# Patient Record
Sex: Female | Born: 1970
Health system: Southern US, Community
[De-identification: ages and names within clinical notes are randomized; demographics above are authoritative.]

## PROBLEM LIST (undated history)

## (undated) DIAGNOSIS — Z789 Other specified health status: Secondary | ICD-10-CM

## (undated) DIAGNOSIS — E119 Type 2 diabetes mellitus without complications: Secondary | ICD-10-CM

## (undated) DIAGNOSIS — E039 Hypothyroidism, unspecified: Secondary | ICD-10-CM

## (undated) DIAGNOSIS — E782 Mixed hyperlipidemia: Secondary | ICD-10-CM

## (undated) DIAGNOSIS — M1990 Unspecified osteoarthritis, unspecified site: Secondary | ICD-10-CM

## (undated) DIAGNOSIS — I10 Essential (primary) hypertension: Secondary | ICD-10-CM

## (undated) DIAGNOSIS — K219 Gastro-esophageal reflux disease without esophagitis: Secondary | ICD-10-CM

## (undated) DIAGNOSIS — Z9889 Other specified postprocedural states: Secondary | ICD-10-CM

## (undated) DIAGNOSIS — R519 Headache, unspecified: Secondary | ICD-10-CM

## (undated) DIAGNOSIS — R112 Nausea with vomiting, unspecified: Secondary | ICD-10-CM

## (undated) HISTORY — PX: PATELLA ARTHROPLASTY: SHX1021

## (undated) HISTORY — DX: Gastro-esophageal reflux disease without esophagitis: K21.9

## (undated) HISTORY — DX: Mixed hyperlipidemia: E78.2

## (undated) HISTORY — DX: Essential (primary) hypertension: I10

## (undated) HISTORY — PX: ABDOMINAL HYSTERECTOMY: SHX81

## (undated) HISTORY — PX: TUBAL LIGATION: SHX77

---

## 2006-11-21 ENCOUNTER — Ambulatory Visit (HOSPITAL_COMMUNITY): Admission: RE | Admit: 2006-11-21 | Discharge: 2006-11-21 | Payer: Self-pay | Admitting: Family Medicine

## 2007-02-12 ENCOUNTER — Ambulatory Visit (HOSPITAL_COMMUNITY): Admission: RE | Admit: 2007-02-12 | Discharge: 2007-02-12 | Payer: Self-pay | Admitting: Family Medicine

## 2007-02-15 HISTORY — PX: KNEE ARTHROSCOPY: SHX127

## 2007-08-13 ENCOUNTER — Ambulatory Visit (HOSPITAL_COMMUNITY): Admission: RE | Admit: 2007-08-13 | Discharge: 2007-08-13 | Payer: Self-pay | Admitting: Family Medicine

## 2007-12-21 ENCOUNTER — Ambulatory Visit (HOSPITAL_COMMUNITY): Admission: RE | Admit: 2007-12-21 | Discharge: 2007-12-21 | Payer: Self-pay | Admitting: Family Medicine

## 2008-01-03 ENCOUNTER — Ambulatory Visit: Payer: Self-pay | Admitting: Orthopedic Surgery

## 2008-01-07 ENCOUNTER — Ambulatory Visit: Payer: Self-pay | Admitting: Orthopedic Surgery

## 2008-11-17 ENCOUNTER — Ambulatory Visit: Payer: Self-pay | Admitting: Orthopedic Surgery

## 2009-01-26 ENCOUNTER — Ambulatory Visit: Payer: Self-pay | Admitting: Orthopedic Surgery

## 2009-02-09 ENCOUNTER — Inpatient Hospital Stay: Payer: Self-pay | Admitting: Orthopedic Surgery

## 2009-02-14 HISTORY — PX: TOTAL KNEE ARTHROPLASTY: SHX125

## 2009-03-12 ENCOUNTER — Ambulatory Visit: Payer: Self-pay | Admitting: Orthopedic Surgery

## 2009-04-16 ENCOUNTER — Ambulatory Visit: Payer: Self-pay | Admitting: Orthopedic Surgery

## 2009-11-25 ENCOUNTER — Ambulatory Visit (HOSPITAL_COMMUNITY): Admission: RE | Admit: 2009-11-25 | Discharge: 2009-11-25 | Payer: Self-pay | Admitting: Family Medicine

## 2010-03-07 ENCOUNTER — Encounter: Payer: Self-pay | Admitting: Family Medicine

## 2010-05-27 NOTE — Assessment & Plan Note (Deleted)
NAME:  Carrie [name], Carrie [name] NO.:  [id number]  MEDICAL RECORD NO.:  [id number]           PATIENT TYPE:  LOCATION:  [facility] at [facility]           FACILITY:  PHYSICIAN:  Tinnie Gens, MD        DATE OF BIRTH:  1970-02-20  DATE OF SERVICE:  05/05/2010                                 CLINIC NOTE  CHIEF COMPLAINT:  New patient followed by ovarian cyst.  HISTORY OF PRESENT ILLNESS:  The patient is a 40 year old gravida 3, para 2-0-1-3 who has previously been a patient at [facility].  She had undergone tubal ligation and endometrial ablation and that often she continues to have regular cycles they are just not heavy as they were before.  Additionally, she was having some issues with some abdominal pain and what not she works at [employer] in [employer] office and the NP there ordered a pelvic and renal ultrasound.  Her renal ultrasound apparently was negative and this was ordered because she had blood in her urine.  She had ovarian cyst noted as well and she is here today to follow that up as well.  Since 4 years since her last Pap smear, she does have a history of abnormal Pap.  PAST MEDICAL HISTORY:  Significant for vitamin D deficiency and obesity.  PAST SURGICAL HISTORY:  She has had a D and C for an elective termination of pregnancy and bilateral tubal ligation and endometrial ablation.  MEDICATIONS:  She is on none.  ALLERGIES:  None known.  OBSTETRICAL HISTORY:  G3, P2 with one termination and two vaginal deliveries.  GYNECOLOGIC HISTORY:  Menarche at age 62.  Cycles are every 24 and 28 and 30 days, last for approximately 6 days with medium flow and mild pain.  She has history of abnormal Pap in 1989, followed up by repeat Pap.  She has not had colpo, cyro or LEEP that she knows of.  FAMILY HISTORY:  Diabetes in her grandmother.  Hypertension in her dad and brother.  Her father is deceased from liver failure secondary to hepatitis  B.  SOCIAL HISTORY:  The patient lives with her husband and 2 children.  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  She does not smoke. She drinks approximately one alcoholic beverage per month and denies other drug use.  REVIEW OF SYSTEMS:  On 14-point review of systems, the patient complains of akinesia which she thinks is related to her weight, where she continuously gains weight.  She complains of fatigue and possible ringing in her ears.  She also had a TSH and other things done at the [facility] at her work within the last year.  PHYSICAL EXAMINATION:  VITAL SIGNS:  On exam today, her vitals are as noted in the chart. GENERAL:  She is an obese female, in no acute distress. HEENT:  Normocephalic, atraumatic.  Sclerae anicteric. NECK:  Supple.  Normal thyroid. LUNGS:  Clear bilaterally. CV:  Regular rate and rhythm without rubs, gallops, or murmurs. ABDOMEN:  Soft, nontender and nondistended. EXTREMITIES:  No cyanosis, clubbing or edema.  Distal pulses 2+. GU:  Normal external female genitalia.  BUS is normal.  Vagina is pink and rugated.  Cervix is parous without lesion.  Uterus is small, anteverted.  No adnexal masses or tenderness.  IMPRESSION: 1. New patient yearly exam. 2. Obesity. 3. Vitamin D. 4. History of ovarian cyst by ultrasound.  PLAN: 1. Pap smear today. 2. We will obtain records of [facility] imaging reports and see what     if any follow up is needed for these ovarian cyst. 3. A lengthy discussion was held with the patient about exercise and     calories and how one would need to modify these things in order to     have weight loss.  We will call the patient when we have results of     her ultrasound studies to see what if any followup is needed.          ______________________________ Tinnie Gens, MD    TP/MEDQ  D:  05/05/2010  T:  05/06/2010  Job:  [id number]

## 2010-11-08 ENCOUNTER — Other Ambulatory Visit (HOSPITAL_COMMUNITY): Payer: Self-pay | Admitting: Family Medicine

## 2010-11-08 DIAGNOSIS — Z139 Encounter for screening, unspecified: Secondary | ICD-10-CM

## 2010-11-11 ENCOUNTER — Other Ambulatory Visit (HOSPITAL_COMMUNITY): Payer: Self-pay | Admitting: Specialist

## 2010-11-11 DIAGNOSIS — M25561 Pain in right knee: Secondary | ICD-10-CM

## 2010-11-19 ENCOUNTER — Encounter (HOSPITAL_COMMUNITY)
Admission: RE | Admit: 2010-11-19 | Discharge: 2010-11-19 | Disposition: A | Discharge status: Home / Self Care | Payer: BC Managed Care – PPO | Source: Ambulatory Visit | Attending: Specialist | Admitting: Specialist

## 2010-11-19 ENCOUNTER — Ambulatory Visit (HOSPITAL_COMMUNITY)
Admission: RE | Admit: 2010-11-19 | Discharge: 2010-11-19 | Disposition: A | Discharge status: Home / Self Care | Payer: BC Managed Care – PPO | Source: Ambulatory Visit | Attending: Specialist | Admitting: Specialist

## 2010-11-19 ENCOUNTER — Other Ambulatory Visit (HOSPITAL_COMMUNITY): Payer: Self-pay | Admitting: Specialist

## 2010-11-19 DIAGNOSIS — Z96659 Presence of unspecified artificial knee joint: Secondary | ICD-10-CM | POA: Insufficient documentation

## 2010-11-19 DIAGNOSIS — M25561 Pain in right knee: Secondary | ICD-10-CM

## 2010-11-19 DIAGNOSIS — M7989 Other specified soft tissue disorders: Secondary | ICD-10-CM | POA: Insufficient documentation

## 2010-11-19 DIAGNOSIS — M25569 Pain in unspecified knee: Secondary | ICD-10-CM | POA: Insufficient documentation

## 2010-11-19 MED ORDER — TECHNETIUM TC 99M MEDRONATE IV KIT
25.0000 | PACK | Freq: Once | INTRAVENOUS | Status: AC | PRN
  Administered 2010-11-19: 25 via INTRAVENOUS

## 2010-11-29 ENCOUNTER — Ambulatory Visit (HOSPITAL_COMMUNITY)
Admission: RE | Admit: 2010-11-29 | Discharge: 2010-11-29 | Disposition: A | Discharge status: Home / Self Care | Payer: BC Managed Care – PPO | Source: Ambulatory Visit | Attending: Family Medicine | Admitting: Family Medicine

## 2010-11-29 DIAGNOSIS — Z139 Encounter for screening, unspecified: Secondary | ICD-10-CM

## 2010-11-29 DIAGNOSIS — Z1231 Encounter for screening mammogram for malignant neoplasm of breast: Secondary | ICD-10-CM | POA: Insufficient documentation

## 2011-02-04 ENCOUNTER — Encounter (HOSPITAL_BASED_OUTPATIENT_CLINIC_OR_DEPARTMENT_OTHER): Payer: Self-pay | Admitting: *Deleted

## 2011-02-04 NOTE — Progress Notes (Signed)
Has had several knee surg [place] [facility]-no problems

## 2011-02-11 ENCOUNTER — Other Ambulatory Visit: Payer: Self-pay | Admitting: Specialist

## 2011-02-11 ENCOUNTER — Encounter (HOSPITAL_BASED_OUTPATIENT_CLINIC_OR_DEPARTMENT_OTHER): Payer: Self-pay | Admitting: Anesthesiology

## 2011-02-11 ENCOUNTER — Encounter (HOSPITAL_BASED_OUTPATIENT_CLINIC_OR_DEPARTMENT_OTHER)
Admission: RE | Disposition: A | Discharge status: Home / Self Care | Payer: Self-pay | Source: Ambulatory Visit | Attending: Specialist

## 2011-02-11 ENCOUNTER — Encounter (HOSPITAL_BASED_OUTPATIENT_CLINIC_OR_DEPARTMENT_OTHER): Payer: Self-pay | Admitting: *Deleted

## 2011-02-11 ENCOUNTER — Ambulatory Visit (HOSPITAL_BASED_OUTPATIENT_CLINIC_OR_DEPARTMENT_OTHER): Payer: BC Managed Care – PPO | Admitting: Anesthesiology

## 2011-02-11 ENCOUNTER — Ambulatory Visit (HOSPITAL_BASED_OUTPATIENT_CLINIC_OR_DEPARTMENT_OTHER)
Admission: RE | Admit: 2011-02-11 | Discharge: 2011-02-11 | Disposition: A | Discharge status: Home / Self Care | Payer: BC Managed Care – PPO | Source: Ambulatory Visit | Attending: Specialist | Admitting: Specialist

## 2011-02-11 ENCOUNTER — Encounter (HOSPITAL_BASED_OUTPATIENT_CLINIC_OR_DEPARTMENT_OTHER): Payer: Self-pay | Admitting: Specialist

## 2011-02-11 DIAGNOSIS — D3613 Benign neoplasm of peripheral nerves and autonomic nervous system of lower limb, including hip: Secondary | ICD-10-CM

## 2011-02-11 DIAGNOSIS — D2120 Benign neoplasm of connective and other soft tissue of unspecified lower limb, including hip: Secondary | ICD-10-CM | POA: Insufficient documentation

## 2011-02-11 HISTORY — DX: Other specified health status: Z78.9

## 2011-02-11 HISTORY — PX: MASS EXCISION: SHX2000

## 2011-02-11 HISTORY — DX: Unspecified osteoarthritis, unspecified site: M19.90

## 2011-02-11 SURGERY — EXCISION MASS
Anesthesia: General | Site: Knee | Laterality: Right | Wound class: Clean

## 2011-02-11 MED ORDER — CEFAZOLIN SODIUM 1-5 GM-% IV SOLN
1.0000 g | INTRAVENOUS | Status: AC
  Administered 2011-02-11: 1 g via INTRAVENOUS

## 2011-02-11 MED ORDER — FENTANYL CITRATE 0.05 MG/ML IJ SOLN
25.0000 ug | INTRAMUSCULAR | Status: DC | PRN
  Administered 2011-02-11: 25 ug via INTRAVENOUS
  Administered 2011-02-11: 50 ug via INTRAVENOUS

## 2011-02-11 MED ORDER — FENTANYL CITRATE 0.05 MG/ML IJ SOLN: 50.0000 ug | INTRAMUSCULAR | Status: DC | PRN

## 2011-02-11 MED ORDER — DEXAMETHASONE SODIUM PHOSPHATE 4 MG/ML IJ SOLN
INTRAMUSCULAR | Status: DC | PRN
  Administered 2011-02-11: 10 mg via INTRAVENOUS

## 2011-02-11 MED ORDER — PROPOFOL 10 MG/ML IV EMUL
INTRAVENOUS | Status: DC | PRN
  Administered 2011-02-11: 200 mg via INTRAVENOUS

## 2011-02-11 MED ORDER — ACETAMINOPHEN 10 MG/ML IV SOLN
1000.0000 mg | Freq: Once | INTRAVENOUS | Status: AC
  Administered 2011-02-11: 1000 mg via INTRAVENOUS

## 2011-02-11 MED ORDER — ONDANSETRON HCL 4 MG/2ML IJ SOLN
INTRAMUSCULAR | Status: DC | PRN
  Administered 2011-02-11: 4 mg via INTRAVENOUS

## 2011-02-11 MED ORDER — CHLORHEXIDINE GLUCONATE 4 % EX LIQD: 60.0000 mL | Freq: Once | CUTANEOUS | Status: DC

## 2011-02-11 MED ORDER — LACTATED RINGERS IV SOLN
INTRAVENOUS | Status: DC
  Administered 2011-02-11 (×2): via INTRAVENOUS

## 2011-02-11 MED ORDER — LIDOCAINE HCL (CARDIAC) 20 MG/ML IV SOLN
INTRAVENOUS | Status: DC | PRN
  Administered 2011-02-11: 100 mg via INTRAVENOUS

## 2011-02-11 MED ORDER — CEPHALEXIN 500 MG PO CAPS: 500.0000 mg | ORAL_CAPSULE | Freq: Three times a day (TID) | ORAL | Status: AC

## 2011-02-11 MED ORDER — MIDAZOLAM HCL 2 MG/2ML IJ SOLN: 0.5000 mg | INTRAMUSCULAR | Status: DC | PRN

## 2011-02-11 MED ORDER — HYDROCODONE-ACETAMINOPHEN 5-500 MG PO CAPS: ORAL_CAPSULE | ORAL | Status: DC

## 2011-02-11 MED ORDER — FENTANYL CITRATE 0.05 MG/ML IJ SOLN
INTRAMUSCULAR | Status: DC | PRN
  Administered 2011-02-11: 25 ug via INTRAVENOUS
  Administered 2011-02-11: 50 ug via INTRAVENOUS
  Administered 2011-02-11: 25 ug via INTRAVENOUS
  Administered 2011-02-11 (×2): 50 ug via INTRAVENOUS

## 2011-02-11 MED ORDER — BUPIVACAINE HCL (PF) 0.5 % IJ SOLN
INTRAMUSCULAR | Status: DC | PRN
  Administered 2011-02-11: 20 mL

## 2011-02-11 MED ORDER — METOCLOPRAMIDE HCL 5 MG/ML IJ SOLN: 10.0000 mg | Freq: Once | INTRAMUSCULAR | Status: DC | PRN

## 2011-02-11 MED ORDER — MIDAZOLAM HCL 5 MG/5ML IJ SOLN
INTRAMUSCULAR | Status: DC | PRN
  Administered 2011-02-11: 2 mg via INTRAVENOUS

## 2011-02-11 MED ORDER — MORPHINE SULFATE 2 MG/ML IJ SOLN: 0.0500 mg/kg | INTRAMUSCULAR | Status: DC | PRN

## 2011-02-11 SURGICAL SUPPLY — 54 items
ADH SKN CLS APL DERMABOND .7 (GAUZE/BANDAGES/DRESSINGS) ×1
APL SKNCLS STERI-STRIP NONHPOA (GAUZE/BANDAGES/DRESSINGS)
BANDAGE ELASTIC 3 VELCRO ST LF (GAUZE/BANDAGES/DRESSINGS) IMPLANT
BANDAGE ELASTIC 4 VELCRO ST LF (GAUZE/BANDAGES/DRESSINGS) IMPLANT
BANDAGE ELASTIC 6 VELCRO ST LF (GAUZE/BANDAGES/DRESSINGS) ×1 IMPLANT
BANDAGE ESMARK 6X9 LF (GAUZE/BANDAGES/DRESSINGS) IMPLANT
BANDAGE GAUZE ELAST BULKY 4 IN (GAUZE/BANDAGES/DRESSINGS) ×1 IMPLANT
BENZOIN TINCTURE PRP APPL 2/3 (GAUZE/BANDAGES/DRESSINGS) IMPLANT
BLADE SURG 15 STRL LF DISP TIS (BLADE) ×1 IMPLANT
BLADE SURG 15 STRL SS (BLADE) ×2
BNDG CMPR 9X6 STRL LF SNTH (GAUZE/BANDAGES/DRESSINGS) ×1
BNDG ESMARK 6X9 LF (GAUZE/BANDAGES/DRESSINGS) ×2
CLOTH BEACON ORANGE TIMEOUT ST (SAFETY) ×2 IMPLANT
CORDS BIPOLAR (ELECTRODE) IMPLANT
COVER MAYO STAND STRL (DRAPES) ×1 IMPLANT
COVER TABLE BACK 60X90 (DRAPES) ×2 IMPLANT
DERMABOND ADVANCED (GAUZE/BANDAGES/DRESSINGS) ×1
DERMABOND ADVANCED .7 DNX12 (GAUZE/BANDAGES/DRESSINGS) IMPLANT
DRAPE EXTREMITY T 121X128X90 (DRAPE) ×2 IMPLANT
DRAPE U 20/CS (DRAPES) ×1 IMPLANT
DRAPE U-SHAPE 47X51 STRL (DRAPES) ×1 IMPLANT
DRSG PAD ABDOMINAL 8X10 ST (GAUZE/BANDAGES/DRESSINGS) ×1 IMPLANT
DURAPREP 26ML APPLICATOR (WOUND CARE) ×2 IMPLANT
ELECT REM PT RETURN 9FT ADLT (ELECTROSURGICAL) ×2
ELECTRODE REM PT RTRN 9FT ADLT (ELECTROSURGICAL) IMPLANT
GAUZE XEROFORM 1X8 LF (GAUZE/BANDAGES/DRESSINGS) ×1 IMPLANT
GLOVE BIOGEL PI IND STRL 8.5 (GLOVE) IMPLANT
GLOVE BIOGEL PI INDICATOR 8.5 (GLOVE) ×1
GLOVE SKINSENSE NS SZ7.0 (GLOVE) ×1
GLOVE SKINSENSE STRL SZ7.0 (GLOVE) IMPLANT
GLOVE SURG ORTHO 8.5 STRL (GLOVE) ×1 IMPLANT
GOWN PREVENTION PLUS XLARGE (GOWN DISPOSABLE) ×2 IMPLANT
GOWN PREVENTION PLUS XXLARGE (GOWN DISPOSABLE) ×1 IMPLANT
NEEDLE HYPO 22GX1.5 SAFETY (NEEDLE) ×1 IMPLANT
PACK BASIN DAY SURGERY FS (CUSTOM PROCEDURE TRAY) ×2 IMPLANT
PAD CAST 4YDX4 CTTN HI CHSV (CAST SUPPLIES) IMPLANT
PADDING CAST COTTON 4X4 STRL (CAST SUPPLIES)
PENCIL BUTTON HOLSTER BLD 10FT (ELECTRODE) ×1 IMPLANT
SPONGE GAUZE 4X4 12PLY (GAUZE/BANDAGES/DRESSINGS) ×1 IMPLANT
SPONGE GAUZE 4X4 16PLY UNSTER (WOUND CARE) ×1 IMPLANT
STAPLER VISISTAT (STAPLE) IMPLANT
STOCKINETTE 4X48 STRL (DRAPES) IMPLANT
STOCKINETTE 6  STRL (DRAPES)
STOCKINETTE 6 STRL (DRAPES) IMPLANT
STOCKINETTE IMPERVIOUS LG (DRAPES) ×1 IMPLANT
STRIP CLOSURE SKIN 1/2X4 (GAUZE/BANDAGES/DRESSINGS) IMPLANT
SUT VIC AB 0 SH 27 (SUTURE) IMPLANT
SUT VIC AB 2-0 SH 27 (SUTURE) ×2
SUT VIC AB 2-0 SH 27XBRD (SUTURE) IMPLANT
SUT VICRYL 4-0 PS2 18IN ABS (SUTURE) ×1 IMPLANT
SYR CONTROL 10ML LL (SYRINGE) ×1 IMPLANT
TOWEL OR 17X24 6PK STRL BLUE (TOWEL DISPOSABLE) ×2 IMPLANT
UNDERPAD 30X30 INCONTINENT (UNDERPADS AND DIAPERS) ×1 IMPLANT
WATER STERILE IRR 1000ML POUR (IV SOLUTION) ×1 IMPLANT

## 2011-02-11 NOTE — Transfer of Care (Signed)
Immediate Anesthesia Transfer of Care Note  Patient: Carrie [name]  Procedure(s) Performed:  EXCISION MASS - resection of neuroma right infrapatellar medial knee  Patient Location: PACU  Anesthesia Type: General  Level of Consciousness: awake and sedated  Airway & Oxygen Therapy: Patient Spontanous Breathing and Patient connected to face mask oxygen  Post-op Assessment: Report given to PACU RN and Post -op Vital signs reviewed and stable  Post vital signs: Reviewed and stable  Complications: No apparent anesthesia complications

## 2011-02-11 NOTE — Anesthesia Postprocedure Evaluation (Signed)
  Anesthesia Post-op Note  Patient: Carrie [name]  Procedure(s) Performed:  EXCISION MASS - resection of neuroma right infrapatellar medial knee  Patient Location: PACU  Anesthesia Type: General  Level of Consciousness: awake, alert  and oriented  Airway and Oxygen Therapy: Patient Spontanous Breathing  Post-op Pain: none  Post-op Assessment: Post-op Vital signs reviewed, Patient's Cardiovascular Status Stable, Respiratory Function Stable, Patent Airway and No signs of Nausea or vomiting  Post-op Vital Signs: Reviewed and stable  Complications: No apparent anesthesia complications

## 2011-02-11 NOTE — H&P (Signed)
The patient has been re-examined, and the chart reviewed, and there have been no interval changes to the documented history and physical. Right knee marked, "x" and initials.   The risks, benefits, and alternatives have been discussed at length, and the patient is willing to proceed.

## 2011-02-11 NOTE — Op Note (Signed)
02/11/2011  2:10 PM  PATIENT:  Carrie [name]  40 y.o. 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 Loistine Simas, local infiltration with 20 cc of half percent Marcaine     COMPLICATIONS:  None.       PROCEDURE: The patient was met in the holding area, and the appropriate knee identified and marked with the "x" and my initials   The patient was then transported to OR and was placed on the operative table in a supine position. The patient was then placed under  general anesthesia without difficulty. The patient received appropriate preoperative antibiotic prophylaxis Ancef 1 g. Tourniquet was applied to the operative  thigh.  Leg was then prepped using sterile conditions and draped using sterile technique.  Time-out procedure was called and correct .    The right leg was elevated and Esmarch exsanguinated with a thigh   tourniquet elevated ot 250 mmHg. the area of the expected saphenous infrapatellar branch had been marked preoperatively with a surgical marking and an ultrasound. In fact the neuroma had been localized as well as branches off of the saphenous vein and nerve medially. Incision was made approximately 4 cm medial to the previous medial parapatellar incision scar. Incision was vertical approximately 3 cm in length through the skin and subcutaneous layers of the subcutaneous and deeper layers were then spread using the Metzenbaum scissors and she Special educational needs teacher. The magnification with loupe were used. Self-retaining retractor placed in [employer] used for retraction and subcutaneous layers the skin traversing saphenous nerve was identified. The  nerve was traced and dissected medially to the level of the medial parapatellar incision site then resected laterally brought medial to its furthest extent and then divided using Metzenbaum scissors. The external retinaculum of the knee was then incised in line with the incision and explored. There was some suggestion further branches of the saphenous nerve here which were resected using the Stevens scissors down to the level of the patient and the synovium. Synovium was left intact. Dissected specimens were sent for pathology evaluation for nerve tissue. Irrigation was carried out tourniquet was released total tourniquet time was 18 minutes at 250 mm mercury all bleeders were cauterized using electrocautery. Deep subcutaneous layers were then approximated with interrupted 2-0 Vicryl sutures more superficial layers with interrupted 2-0 Vicryl sutures and the skin closed with interrupted 4-0 Vicryl sutures and Dermabond. 4 x 4's ABDs fixed to the skin with Kerlex and Ace wrap was applied from right foot to the right lower thigh down to the skin and subcutaneous layers were infiltrated with Marcaine half percent of bone from prior to the incision and at the end of the case. Total of 20 cc was used. All instrument and sponge counts were correct. Patient was then reactivated extubated and returned to recovery room in satisfactory condition.Marland Kitchen    Specimen: Infrapatellar branch neuroma of the infrapatellar branch of the right saphenous nerve. Soft tissue on the medial aspect right anterior inferior medial knee.     NITKA,JAMES E  02/11/2011, 2:10 PM

## 2011-02-11 NOTE — Anesthesia Procedure Notes (Signed)
Procedure Name: LMA Insertion Date/Time: 02/11/2011 1:09 PM Performed by: Signa Kell Pre-anesthesia Checklist: Patient identified, Emergency Drugs available, Suction available and Patient being monitored Patient Re-evaluated:Patient Re-evaluated prior to inductionOxygen Delivery Method: Circle System Utilized Preoxygenation: Pre-oxygenation with 100% oxygen Intubation Type: IV induction Ventilation: Mask ventilation without difficulty LMA: LMA inserted LMA Size: 4.0 Number of attempts: 1 Airway Equipment and Method: bite block Placement Confirmation: positive ETCO2 Tube secured with: Tape Dental Injury: Teeth and Oropharynx as per pre-operative assessment

## 2011-02-11 NOTE — H&P (Signed)
Carrie [name] is an 40 y.o. female.   Chief Complaint: Right knee pain  HPI: 40 year old female, 2 years post right knee resurfacing arthropasty of the PF joint with persistant severe medial joint pain. Pain reproducible medial inferior anterior knee. Relieved with local block. Finding consistent with neuroma of the infrapatella branch of the saphenous Nerve.  Past Medical History  Diagnosis Date  . Arthritis   . No pertinent past medical history     Past Surgical History  Procedure Date  . Knee arthroscopy 2009  . Total knee arthroplasty 2011    partial-[place] [facility]   . Patella arthroplasty     rt knee-  . Abdominal hysterectomy   . Tubal ligation     History reviewed. No pertinent family history. Social History:  reports that she has never smoked. She does not have any smokeless tobacco history on file. She reports that she drinks alcohol. She reports that she does not use illicit drugs.  Allergies:  Allergies  Allergen Reactions  . Codeine     headaches    Medications Prior to Admission  Medication Dose Route Frequency Provider Last Rate Last Dose  . acetaminophen (OFIRMEV) IV 1,000 mg  1,000 mg Intravenous Once Constance Goltz, MD      . fentaNYL (SUBLIMAZE) injection 50-100 mcg  50-100 mcg Intravenous PRN Constance Goltz, MD      . lactated ringers infusion   Intravenous Continuous Zenon Mayo, MD 20 mL/hr at 02/11/11 1139    . midazolam (VERSED) injection 0.5-2 mg  0.5-2 mg Intravenous PRN Constance Goltz, MD       Medications Prior to Admission  Medication Sig Dispense Refill  . ibuprofen (ADVIL,MOTRIN) 200 MG tablet Take 200 mg by mouth every 6 (six) hours as needed.          Results for orders placed during the hospital encounter of 02/11/11 (from the past 48 hour(s))  POCT HEMOGLOBIN-HEMACUE     Status: Normal   Collection Time   02/11/11 11:42 AM      Component Value Range Comment   Hemoglobin 14.9  12.0 - 15.0 (g/dL)     No results found.  Review of Systems  Constitutional: Negative.  Negative for fever, chills, weight loss, malaise/fatigue and diaphoresis.  HENT: Negative for hearing loss, ear pain, nosebleeds, congestion, sore throat, neck pain, tinnitus and ear discharge.   Eyes: Negative for blurred vision, double vision, photophobia, pain, discharge and redness.  Respiratory: Negative for cough, hemoptysis, sputum production, shortness of breath, wheezing and stridor.   Cardiovascular: Negative for chest pain, palpitations, orthopnea, claudication, leg swelling and PND.  Gastrointestinal: Negative for heartburn, nausea, vomiting, abdominal pain, diarrhea, constipation, blood in stool and melena.  Genitourinary: Negative for dysuria, urgency, frequency, hematuria and flank pain.  Musculoskeletal: Positive for joint pain. Negative for myalgias, back pain and falls.  Skin: Negative for itching and rash.  Neurological: Positive for tingling and sensory change. Negative for dizziness, tremors, speech change, focal weakness, seizures, loss of consciousness, weakness and headaches.  Endo/Heme/Allergies: Negative for environmental allergies and polydipsia. Does not bruise/bleed easily.  Psychiatric/Behavioral: Negative for depression, suicidal ideas, hallucinations, memory loss and substance abuse. The patient is not nervous/anxious and does not have insomnia.     Blood pressure 135/82, pulse 81, temperature 98 F (36.7 C), temperature source Oral, resp. rate 20, height 4\' 11"  (1.499 m), weight 60.328 kg (133 lb), SpO2 100.00%. Physical Exam  Constitutional: She is oriented to person, place,  and time. She appears well-developed and well-nourished.  HENT:  Head: Normocephalic and atraumatic.  Right Ear: External ear normal.  Left Ear: External ear normal.  Nose: Nose normal.  Mouth/Throat: No oropharyngeal exudate.  Eyes: Conjunctivae and EOM are normal. Pupils are equal, round, and reactive to light.  Right eye exhibits no discharge. Left eye exhibits no discharge. No scleral icterus.  Neck: Normal range of motion. Neck supple. No JVD present. No tracheal deviation present. No thyromegaly present.  Cardiovascular: Normal rate, regular rhythm and normal heart sounds.  Exam reveals no gallop and no friction rub.   No murmur heard. Respiratory: Breath sounds normal. No stridor. No respiratory distress. She has no wheezes. She has no rales. She exhibits no tenderness.  GI: Soft. Bowel sounds are normal. She exhibits no distension and no mass. There is no tenderness. There is no rebound and no guarding.  Musculoskeletal: Normal range of motion. She exhibits no edema and no tenderness.  Lymphadenopathy:    She has no cervical adenopathy.  Neurological: She is alert and oriented to person, place, and time. She has normal reflexes. She displays normal reflexes. No cranial nerve deficit. She exhibits normal muscle tone. Coordination normal.  Skin: Skin is warm and dry. No rash noted. No erythema. No pallor.  Psychiatric: She has a normal mood and affect. Her behavior is normal. Judgment and thought content normal.   anesthesia right lateral knee, reproducible tinels positive nerve pain over medial inferior right knee.  Assessment/Plan Right infrapatella branch of saphenous nerve neuroma. Right Patellofemoral resurfacing arthroplasty.  Plan: Resection of right infrapatella branch of saphenous neuroma.   Tirso Laws E 02/11/2011, 12:43 PM

## 2011-02-11 NOTE — Brief Op Note (Signed)
02/11/2011  2:04 PM  PATIENT:  Carrie [name]  40 y.o. 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  SURGEON:  Surgeon(s): Kerrin Champagne, MD  ASSISTANTS: none   ANESTHESIA:   local and general  EBL:  Total I/O In: 1000 [I.V.:1000] Out: -   BLOOD ADMINISTERED:none  DRAINS: none   LOCAL MEDICATIONS USED:  MARCAINE 0.5% 20CC  SPECIMEN:  Source of Specimen:  RIGHT MEDIAL KNEE NEUROMA AND SOFT TISSUE  DISPOSITION OF SPECIMEN:  PATHOLOGY  COUNTS:  YES  TOURNIQUET:   Total Tourniquet Time Documented: Thigh (Right) - 20 minutes  DICTATION: .Dragon Dictation  PLAN OF CARE: Discharge to home after PACU  PATIENT DISPOSITION:  PACU - hemodynamically stable.   Delay start of Pharmacological VTE agent (>24hrs) due to surgical blood loss or risk of bleeding:  n/a

## 2011-02-11 NOTE — Anesthesia Preprocedure Evaluation (Signed)
Anesthesia Evaluation  Patient identified by MRN, date of birth, ID band Patient awake    Reviewed: Allergy & Precautions, H&P , NPO status , Patient's Chart, lab work & pertinent test results, reviewed documented beta blocker date and time   Airway Mallampati: II TM Distance: >3 FB Neck ROM: full    Dental   Pulmonary neg pulmonary ROS,          Cardiovascular neg cardio ROS     Neuro/Psych Negative Neurological ROS  Negative Psych ROS   GI/Hepatic negative GI ROS, Neg liver ROS,   Endo/Other  Negative Endocrine ROS  Renal/GU negative Renal ROS  Genitourinary negative   Musculoskeletal   Abdominal   Peds  Hematology negative hematology ROS (+)   Anesthesia Other Findings See surgeon's H&P   Reproductive/Obstetrics negative OB ROS                           Anesthesia Physical Anesthesia Plan  ASA: I  Anesthesia Plan: General   Post-op Pain Management:    Induction: Intravenous  Airway Management Planned: LMA  Additional Equipment:   Intra-op Plan:   Post-operative Plan: Extubation in OR  Informed Consent: I have reviewed the patients History and Physical, chart, labs and discussed the procedure including the risks, benefits and alternatives for the proposed anesthesia with the patient or authorized representative who has indicated his/her understanding and acceptance.     Plan Discussed with: CRNA and Surgeon  Anesthesia Plan Comments:         Anesthesia Quick Evaluation  

## 2011-02-14 ENCOUNTER — Encounter (HOSPITAL_BASED_OUTPATIENT_CLINIC_OR_DEPARTMENT_OTHER): Payer: Self-pay | Admitting: Specialist

## 2011-06-17 ENCOUNTER — Other Ambulatory Visit: Payer: Self-pay | Admitting: Orthopedic Surgery

## 2011-06-17 DIAGNOSIS — M25561 Pain in right knee: Secondary | ICD-10-CM

## 2011-06-24 ENCOUNTER — Other Ambulatory Visit: Payer: Self-pay | Admitting: Orthopedic Surgery

## 2011-06-28 ENCOUNTER — Ambulatory Visit
Admission: RE | Admit: 2011-06-28 | Discharge: 2011-06-28 | Disposition: A | Discharge status: Home / Self Care | Payer: Self-pay | Source: Ambulatory Visit | Attending: Orthopedic Surgery | Admitting: Orthopedic Surgery

## 2011-06-28 DIAGNOSIS — M25561 Pain in right knee: Secondary | ICD-10-CM

## 2011-06-28 MED ORDER — IOHEXOL 180 MG/ML  SOLN
20.0000 mL | Freq: Once | INTRAMUSCULAR | Status: AC | PRN
  Administered 2011-06-28: 20 mL via INTRA_ARTICULAR

## 2011-10-14 ENCOUNTER — Other Ambulatory Visit (HOSPITAL_COMMUNITY): Payer: Self-pay | Admitting: Family Medicine

## 2011-10-14 DIAGNOSIS — N62 Hypertrophy of breast: Secondary | ICD-10-CM

## 2011-10-19 ENCOUNTER — Ambulatory Visit (HOSPITAL_COMMUNITY)
Admission: RE | Admit: 2011-10-19 | Discharge: 2011-10-19 | Disposition: A | Discharge status: Home / Self Care | Payer: BC Managed Care – PPO | Source: Ambulatory Visit | Attending: Family Medicine | Admitting: Family Medicine

## 2011-10-19 ENCOUNTER — Encounter (HOSPITAL_COMMUNITY): Payer: Self-pay

## 2011-10-19 DIAGNOSIS — N62 Hypertrophy of breast: Secondary | ICD-10-CM

## 2011-10-19 DIAGNOSIS — N644 Mastodynia: Secondary | ICD-10-CM | POA: Insufficient documentation

## 2011-10-31 ENCOUNTER — Other Ambulatory Visit (HOSPITAL_COMMUNITY): Payer: Self-pay | Admitting: Family Medicine

## 2011-10-31 DIAGNOSIS — Z139 Encounter for screening, unspecified: Secondary | ICD-10-CM

## 2011-12-15 ENCOUNTER — Other Ambulatory Visit (HOSPITAL_COMMUNITY): Payer: Self-pay | Admitting: Specialist

## 2011-12-15 DIAGNOSIS — S76119A Strain of unspecified quadriceps muscle, fascia and tendon, initial encounter: Secondary | ICD-10-CM

## 2011-12-16 ENCOUNTER — Ambulatory Visit (HOSPITAL_COMMUNITY): Payer: Self-pay

## 2011-12-21 ENCOUNTER — Ambulatory Visit (HOSPITAL_COMMUNITY)
Admission: RE | Admit: 2011-12-21 | Discharge: 2011-12-21 | Disposition: A | Discharge status: Home / Self Care | Payer: Worker's Compensation | Source: Ambulatory Visit | Attending: Specialist | Admitting: Specialist

## 2011-12-21 ENCOUNTER — Other Ambulatory Visit (HOSPITAL_COMMUNITY): Payer: Self-pay | Admitting: Specialist

## 2011-12-21 DIAGNOSIS — W19XXXA Unspecified fall, initial encounter: Secondary | ICD-10-CM | POA: Insufficient documentation

## 2011-12-21 DIAGNOSIS — S76119A Strain of unspecified quadriceps muscle, fascia and tendon, initial encounter: Secondary | ICD-10-CM

## 2011-12-21 DIAGNOSIS — S838X9A Sprain of other specified parts of unspecified knee, initial encounter: Secondary | ICD-10-CM | POA: Insufficient documentation

## 2011-12-22 ENCOUNTER — Ambulatory Visit (HOSPITAL_COMMUNITY)
Admission: RE | Admit: 2011-12-22 | Discharge: 2011-12-22 | Disposition: A | Discharge status: Home / Self Care | Payer: BC Managed Care – PPO | Source: Ambulatory Visit | Attending: Family Medicine | Admitting: Family Medicine

## 2011-12-22 DIAGNOSIS — Z1231 Encounter for screening mammogram for malignant neoplasm of breast: Secondary | ICD-10-CM | POA: Insufficient documentation

## 2011-12-22 DIAGNOSIS — Z139 Encounter for screening, unspecified: Secondary | ICD-10-CM

## 2012-11-20 ENCOUNTER — Other Ambulatory Visit (HOSPITAL_COMMUNITY): Payer: Self-pay | Admitting: Family Medicine

## 2012-11-20 DIAGNOSIS — Z139 Encounter for screening, unspecified: Secondary | ICD-10-CM

## 2012-12-24 ENCOUNTER — Ambulatory Visit (HOSPITAL_COMMUNITY)
Admission: RE | Admit: 2012-12-24 | Discharge: 2012-12-24 | Disposition: A | Discharge status: Home / Self Care | Payer: BC Managed Care – PPO | Source: Ambulatory Visit | Attending: Family Medicine | Admitting: Family Medicine

## 2012-12-24 DIAGNOSIS — Z139 Encounter for screening, unspecified: Secondary | ICD-10-CM

## 2012-12-24 DIAGNOSIS — Z1231 Encounter for screening mammogram for malignant neoplasm of breast: Secondary | ICD-10-CM | POA: Insufficient documentation

## 2013-01-01 ENCOUNTER — Other Ambulatory Visit (HOSPITAL_COMMUNITY): Payer: Self-pay | Admitting: Orthopaedic Surgery

## 2013-01-01 DIAGNOSIS — T84032A Mechanical loosening of internal right knee prosthetic joint, initial encounter: Secondary | ICD-10-CM

## 2013-01-17 ENCOUNTER — Encounter (HOSPITAL_COMMUNITY)
Admission: RE | Admit: 2013-01-17 | Discharge: 2013-01-17 | Disposition: A | Discharge status: Home / Self Care | Payer: BC Managed Care – PPO | Source: Ambulatory Visit | Attending: Orthopaedic Surgery | Admitting: Orthopaedic Surgery

## 2013-01-17 ENCOUNTER — Ambulatory Visit (HOSPITAL_COMMUNITY)
Admission: RE | Admit: 2013-01-17 | Discharge: 2013-01-17 | Disposition: A | Discharge status: Home / Self Care | Payer: BC Managed Care – PPO | Source: Ambulatory Visit | Attending: Orthopaedic Surgery | Admitting: Orthopaedic Surgery

## 2013-01-17 DIAGNOSIS — T84032A Mechanical loosening of internal right knee prosthetic joint, initial encounter: Secondary | ICD-10-CM

## 2013-01-17 DIAGNOSIS — R948 Abnormal results of function studies of other organs and systems: Secondary | ICD-10-CM | POA: Insufficient documentation

## 2013-01-17 DIAGNOSIS — Z96649 Presence of unspecified artificial hip joint: Secondary | ICD-10-CM | POA: Insufficient documentation

## 2013-01-17 MED ORDER — TECHNETIUM TC 99M MEDRONATE IV KIT
26.0000 | PACK | Freq: Once | INTRAVENOUS | Status: AC | PRN
  Administered 2013-01-17: 26 via INTRAVENOUS

## 2014-01-16 ENCOUNTER — Other Ambulatory Visit (HOSPITAL_COMMUNITY): Payer: Self-pay | Admitting: Family Medicine

## 2014-01-16 DIAGNOSIS — Z1231 Encounter for screening mammogram for malignant neoplasm of breast: Secondary | ICD-10-CM

## 2014-01-20 ENCOUNTER — Ambulatory Visit (HOSPITAL_COMMUNITY)
Admission: RE | Admit: 2014-01-20 | Discharge: 2014-01-20 | Disposition: A | Discharge status: Home / Self Care | Payer: BC Managed Care – PPO | Source: Ambulatory Visit | Attending: Family Medicine | Admitting: Family Medicine

## 2014-01-20 DIAGNOSIS — Z1231 Encounter for screening mammogram for malignant neoplasm of breast: Secondary | ICD-10-CM | POA: Insufficient documentation

## 2014-04-21 ENCOUNTER — Other Ambulatory Visit: Payer: Self-pay | Admitting: Family Medicine

## 2014-04-21 DIAGNOSIS — N6459 Other signs and symptoms in breast: Secondary | ICD-10-CM

## 2014-05-06 ENCOUNTER — Encounter: Payer: Self-pay | Admitting: *Deleted

## 2014-05-07 ENCOUNTER — Other Ambulatory Visit (HOSPITAL_COMMUNITY): Payer: Self-pay | Admitting: Family Medicine

## 2014-05-07 DIAGNOSIS — G4452 New daily persistent headache (NDPH): Secondary | ICD-10-CM

## 2014-05-08 ENCOUNTER — Encounter: Payer: Self-pay | Admitting: Obstetrics & Gynecology

## 2014-05-08 ENCOUNTER — Ambulatory Visit (INDEPENDENT_AMBULATORY_CARE_PROVIDER_SITE_OTHER): Payer: BLUE CROSS/BLUE SHIELD | Admitting: Obstetrics & Gynecology

## 2014-05-08 VITALS — BP 128/84 | HR 60 | Ht 59.0 in | Wt 157.5 lb

## 2014-05-08 DIAGNOSIS — N644 Mastodynia: Secondary | ICD-10-CM

## 2014-05-08 DIAGNOSIS — M940 Chondrocostal junction syndrome [Tietze]: Secondary | ICD-10-CM | POA: Diagnosis not present

## 2014-05-08 MED ORDER — PIROXICAM 20 MG PO CAPS: 20.0000 mg | ORAL_CAPSULE | Freq: Every day | ORAL | Status: DC

## 2014-05-08 NOTE — Progress Notes (Signed)
Patient ID: Carrie [name], female   DOB: 07-27-1970, 44 y.o.   MRN: [id number]   Chief Complaint  Patient presents with  . referrel    bilateral breast pain, "breast feel engorged."     HPI:    44 y.o. No obstetric history on file. No LMP recorded. Patient has had a hysterectomy.  Breast pain which the patient says has been present for a few months but worsening Location:  Both breats. Quality:  Full feeling. Severity:  Mild to moderate. Timing:  daily. Duration:  Essentially all the time. Context:  Nothing makes it worse better that she is aware of. Modifying factors:   Signs/Symptoms:      Current outpatient prescriptions:  .  butalbital-acetaminophen-caffeine (FIORICET, ESGIC) 50-325-40 MG per tablet, Take 1 tablet by mouth every 6 (six) hours as needed. , Disp: , Rfl:  .  ibuprofen (ADVIL,MOTRIN) 200 MG tablet, Take 200 mg by mouth every 6 (six) hours as needed.  , Disp: , Rfl:  .  lisinopril (PRINIVIL,ZESTRIL) 5 MG tablet, Take 5 mg by mouth daily. , Disp: , Rfl:  .  hydrocodone-acetaminophen (LORCET-HD) 5-500 MG per capsule, 1-2 tablets po every 4-6 hours prn pain. Max of 8 per day. (Patient not taking: Reported on 05/08/2014), Disp: 40 capsule, Rfl: 1 .  piroxicam (FELDENE) 20 MG capsule, Take 1 capsule (20 mg total) by mouth daily., Disp: 30 capsule, Rfl: 1  Problem Pertinent ROS:       No breast discharge or skin changes or fever in breasts No SOB or chest pain No cough  Extended ROS:     Review of Systems - Negative except as above   [place]:             Past Medical History  Diagnosis Date  . Arthritis   . No pertinent past medical history   . Hypertension     Past Surgical History  Procedure Laterality Date  . Knee arthroscopy  2009  . Total knee arthroplasty  2011    partial-[place] [facility]   . Patella arthroplasty      rt knee-  . Abdominal hysterectomy    . Tubal ligation    . Mass excision  02/11/2011    Procedure: EXCISION MASS;  Surgeon: Jessy Oto, MD;  Location: [place];  Service: Orthopedics;  Laterality: Right;  resection of neuroma right infrapatellar medial knee    OB History    No data available      Allergies  Allergen Reactions  . Codeine     headaches    History   Social History  . Marital Status: Married    Spouse Name: N/A  . Number of Children: N/A  . Years of Education: N/A   Social History Main Topics  . Smoking status: Never Smoker   . Smokeless tobacco: Never Used  . Alcohol Use: No  . Drug Use: No  . Sexual Activity: Yes    Birth Control/ Protection: Surgical   Other Topics Concern  . None   Social History Narrative    Family History  Problem Relation Age of Onset  . Cancer Mother     breast  . Heart disease Maternal Grandmother   . Cancer Maternal Grandmother     breast  . Heart disease Paternal Grandfather      Examination:  Vitals:  Blood pressure 128/84, pulse 60, height 4\' 11"  (1.499 m), weight 157 lb 8 oz (71.442 kg).    Physical Examination:  Breasts: breasts appear normal, no suspicious masses, no skin or nipple changes or axillary nodes, when you moved the breast themselves the area pain isolated to the chest wall specifically where the ribs meet the sternal cartilage. Isolating the breast themselves there is no pain  Chest wall there is tenderness of the costochondral intersections and chest wall itself   DATA orders and reviews: Labs were not ordered today:   Imaging studies were not ordered today:    Lab tests were not reviewed today:    Imaging studies were reviewed today:  Mammogram 01/2104  I did not independently review/view images, tracing or specimen(not simply the report) myself.  Prescription Drug Management:  New Prescriptions: feldene 20 mg daily Renewed Prescriptions:   Current prescription changes:     Impression/Plan(Problem Based): 1.  Costochondritis not primary breast pain      (new problem) : Additional workup is  not needed:  I will place her on Feldene 20 mg daily to see if these this helps. She just had a normal mammogram in December and I don't feel anything on exam that would warrant further imaging. Pain is indeed on the chest wall and the supporting ligaments of the breast.  Her breast are quite large and I the size of the miscarriage probably putting a lot of stress and pull traction on the chest wall and this is leading to her pain   Follow Up:   1  months

## 2014-05-14 ENCOUNTER — Ambulatory Visit (HOSPITAL_COMMUNITY)
Admission: RE | Admit: 2014-05-14 | Discharge: 2014-05-14 | Disposition: A | Discharge status: Home / Self Care | Payer: BLUE CROSS/BLUE SHIELD | Source: Ambulatory Visit | Attending: Family Medicine | Admitting: Family Medicine

## 2014-05-14 DIAGNOSIS — G4452 New daily persistent headache (NDPH): Secondary | ICD-10-CM | POA: Diagnosis present

## 2014-06-09 ENCOUNTER — Encounter: Payer: Self-pay | Admitting: Obstetrics & Gynecology

## 2014-06-09 ENCOUNTER — Ambulatory Visit (INDEPENDENT_AMBULATORY_CARE_PROVIDER_SITE_OTHER): Payer: BLUE CROSS/BLUE SHIELD | Admitting: Obstetrics & Gynecology

## 2014-06-09 VITALS — BP 142/80 | HR 72 | Ht 59.0 in | Wt 157.0 lb

## 2014-06-09 DIAGNOSIS — N644 Mastodynia: Secondary | ICD-10-CM

## 2014-06-09 DIAGNOSIS — M940 Chondrocostal junction syndrome [Tietze]: Secondary | ICD-10-CM | POA: Diagnosis not present

## 2014-07-12 NOTE — Progress Notes (Signed)
Patient ID: CATHE BILGER, female   DOB: 1971-01-02, 44 y.o.   MRN: [id number] Patient ID: CANIYA TAGLE, female   DOB: June 09, 1970, 44 y.o.   MRN: [id number]  See previous note below:  No positive response to feldene  Repeat breast exam is unchanged, still with costochondritis and pain I think is caused by her large breasts, she is interested in reduction and we discussed different surgical options which she will explore  I will see her back prn     Face to face time:  10 minutes  Greater than 50% of the visit time was spent in counseling and coordination of care with the patient.  The summary and outline of the counseling and care coordination is summarized in the note above.   All questions were answered.  Chief Complaint  Patient presents with  . both breasts are tender and painful     x 2 months     HPI:    44 y.o. No obstetric history on file. No LMP recorded. Patient has had a hysterectomy.  Breast pain which the patient says has been present for a few months but worsening Location:  Both breats. Quality:  Full feeling. Severity:  Mild to moderate. Timing:  daily. Duration:  Essentially all the time. Context:  Nothing makes it worse better that she is aware of. Modifying factors:   Signs/Symptoms:      Current outpatient prescriptions:  .  butalbital-acetaminophen-caffeine (FIORICET, ESGIC) 50-325-40 MG per tablet, Take 1 tablet by mouth every 6 (six) hours as needed. , Disp: , Rfl:  .  hydrocodone-acetaminophen (LORCET-HD) 5-500 MG per capsule, 1-2 tablets po every 4-6 hours prn pain. Max of 8 per day., Disp: 40 capsule, Rfl: 1 .  ibuprofen (ADVIL,MOTRIN) 200 MG tablet, Take 200 mg by mouth every 6 (six) hours as needed.  , Disp: , Rfl:  .  lisinopril (PRINIVIL,ZESTRIL) 5 MG tablet, Take 5 mg by mouth daily. , Disp: , Rfl:   Problem Pertinent ROS:       No breast discharge or skin changes or fever in breasts No SOB or chest pain No cough  Extended ROS:      Review of Systems - Negative except as above   [place]:             Past Medical History  Diagnosis Date  . Arthritis   . No pertinent past medical history   . Hypertension     Past Surgical History  Procedure Laterality Date  . Knee arthroscopy  2009  . Total knee arthroplasty  2011    partial-[place] [facility]   . Patella arthroplasty      rt knee-  . Abdominal hysterectomy    . Tubal ligation    . Mass excision  02/11/2011    Procedure: EXCISION MASS;  Surgeon: Jessy Oto, MD;  Location: [facility];  Service: Orthopedics;  Laterality: Right;  resection of neuroma right infrapatellar medial knee    OB History    No data available      Allergies  Allergen Reactions  . Codeine     headaches    History   Social History  . Marital Status: Married    Spouse Name: N/A  . Number of Children: N/A  . Years of Education: N/A   Social History Main Topics  . Smoking status: Never Smoker   . Smokeless tobacco: Never Used  . Alcohol Use: No  . Drug Use: No  . Sexual  Activity: Yes    Birth Control/ Protection: Surgical     Comment: hyst   Other Topics Concern  . None   Social History Narrative    Family History  Problem Relation Age of Onset  . Cancer Mother     breast  . Heart disease Maternal Grandmother   . Cancer Maternal Grandmother     breast  . Heart disease Paternal Grandfather   . Heart disease Father   . Other Father     liver problems  . Diabetes Brother   . Heart disease Maternal Grandfather      Examination:  Vitals:  Blood pressure 142/80, pulse 72, height 4\' 11"  (1.499 m), weight 157 lb (71.215 kg).    Physical Examination:     Breasts: breasts appear normal, no suspicious masses, no skin or nipple changes or axillary nodes, when you moved the breast themselves the area pain isolated to the chest wall specifically where the ribs meet the sternal cartilage. Isolating the breast themselves there is no pain  Chest wall  there is tenderness of the costochondral intersections and chest wall itself   DATA orders and reviews: Labs were not ordered today:   Imaging studies were not ordered today:    Lab tests were not reviewed today:    Imaging studies were reviewed today:  Mammogram 01/2104  I did not independently review/view images, tracing or specimen(not simply the report) myself.  Prescription Drug Management:  New Prescriptions: feldene 20 mg daily Renewed Prescriptions:   Current prescription changes:     Impression/Plan(Problem Based): 1.  Costochondritis not primary breast pain      (new problem) : Additional workup is not needed:  I will place her on Feldene 20 mg daily to see if these this helps. She just had a normal mammogram in December and I don't feel anything on exam that would warrant further imaging. Pain is indeed on the chest wall and the supporting ligaments of the breast.  Her breast are quite large and I the size of the miscarriage probably putting a lot of stress and pull traction on the chest wall and this is leading to her pain   Follow Up:   1  months

## 2015-02-15 HISTORY — PX: REDUCTION MAMMAPLASTY: SUR839

## 2015-05-13 DIAGNOSIS — N62 Hypertrophy of breast: Secondary | ICD-10-CM | POA: Insufficient documentation

## 2015-05-25 ENCOUNTER — Other Ambulatory Visit (HOSPITAL_COMMUNITY): Payer: Self-pay | Admitting: Plastic Surgery

## 2015-05-25 DIAGNOSIS — Z139 Encounter for screening, unspecified: Secondary | ICD-10-CM

## 2015-05-28 ENCOUNTER — Ambulatory Visit (HOSPITAL_COMMUNITY)
Admission: RE | Admit: 2015-05-28 | Discharge: 2015-05-28 | Disposition: A | Discharge status: Home / Self Care | Payer: Managed Care, Other (non HMO) | Source: Ambulatory Visit | Attending: Plastic Surgery | Admitting: Plastic Surgery

## 2015-05-28 DIAGNOSIS — Z139 Encounter for screening, unspecified: Secondary | ICD-10-CM

## 2015-05-28 DIAGNOSIS — Z1239 Encounter for other screening for malignant neoplasm of breast: Secondary | ICD-10-CM | POA: Insufficient documentation

## 2015-05-28 DIAGNOSIS — Z1231 Encounter for screening mammogram for malignant neoplasm of breast: Secondary | ICD-10-CM | POA: Insufficient documentation

## 2015-06-23 ENCOUNTER — Other Ambulatory Visit: Payer: Self-pay | Admitting: Plastic Surgery

## 2015-09-02 DIAGNOSIS — M2201 Recurrent dislocation of patella, right knee: Secondary | ICD-10-CM | POA: Insufficient documentation

## 2015-09-02 DIAGNOSIS — M223X9 Other derangements of patella, unspecified knee: Secondary | ICD-10-CM | POA: Insufficient documentation

## 2016-06-06 ENCOUNTER — Other Ambulatory Visit (HOSPITAL_COMMUNITY): Payer: Self-pay | Admitting: Registered Nurse

## 2016-06-06 ENCOUNTER — Ambulatory Visit (HOSPITAL_COMMUNITY)
Admission: RE | Admit: 2016-06-06 | Discharge: 2016-06-06 | Disposition: A | Discharge status: Home / Self Care | Payer: 59 | Source: Ambulatory Visit | Attending: Registered Nurse | Admitting: Registered Nurse

## 2016-06-06 DIAGNOSIS — R1011 Right upper quadrant pain: Secondary | ICD-10-CM

## 2016-06-06 DIAGNOSIS — R1031 Right lower quadrant pain: Secondary | ICD-10-CM

## 2016-06-06 DIAGNOSIS — K760 Fatty (change of) liver, not elsewhere classified: Secondary | ICD-10-CM | POA: Insufficient documentation

## 2016-06-28 ENCOUNTER — Other Ambulatory Visit (HOSPITAL_COMMUNITY): Payer: Self-pay | Admitting: Family Medicine

## 2016-06-28 DIAGNOSIS — Z1231 Encounter for screening mammogram for malignant neoplasm of breast: Secondary | ICD-10-CM

## 2016-07-06 ENCOUNTER — Ambulatory Visit (HOSPITAL_COMMUNITY): Payer: Self-pay

## 2016-07-07 ENCOUNTER — Encounter (HOSPITAL_COMMUNITY): Payer: Self-pay

## 2016-07-07 ENCOUNTER — Ambulatory Visit (HOSPITAL_COMMUNITY)
Admission: RE | Admit: 2016-07-07 | Discharge: 2016-07-07 | Disposition: A | Discharge status: Home / Self Care | Payer: 59 | Source: Ambulatory Visit | Attending: Family Medicine | Admitting: Family Medicine

## 2016-07-07 DIAGNOSIS — Z1231 Encounter for screening mammogram for malignant neoplasm of breast: Secondary | ICD-10-CM

## 2016-08-09 ENCOUNTER — Ambulatory Visit (HOSPITAL_COMMUNITY)
Admission: RE | Admit: 2016-08-09 | Discharge: 2016-08-09 | Disposition: A | Discharge status: Home / Self Care | Payer: 59 | Source: Ambulatory Visit | Attending: Registered Nurse | Admitting: Registered Nurse

## 2016-08-09 ENCOUNTER — Other Ambulatory Visit (HOSPITAL_COMMUNITY): Payer: Self-pay | Admitting: Registered Nurse

## 2016-08-09 DIAGNOSIS — E663 Overweight: Secondary | ICD-10-CM

## 2016-08-09 DIAGNOSIS — Z1389 Encounter for screening for other disorder: Secondary | ICD-10-CM | POA: Diagnosis present

## 2016-11-01 ENCOUNTER — Ambulatory Visit (INDEPENDENT_AMBULATORY_CARE_PROVIDER_SITE_OTHER): Payer: Managed Care, Other (non HMO)

## 2016-11-01 ENCOUNTER — Ambulatory Visit (INDEPENDENT_AMBULATORY_CARE_PROVIDER_SITE_OTHER): Payer: Managed Care, Other (non HMO) | Admitting: Physician Assistant

## 2016-11-01 DIAGNOSIS — M25511 Pain in right shoulder: Secondary | ICD-10-CM

## 2016-11-01 MED ORDER — BUPIVACAINE HCL 0.25 % IJ SOLN
4.0000 mL | INTRAMUSCULAR | Status: AC | PRN
  Administered 2016-11-01: 4 mL via INTRA_ARTICULAR

## 2016-11-01 MED ORDER — METHYLPREDNISOLONE ACETATE 40 MG/ML IJ SUSP
40.0000 mg | INTRAMUSCULAR | Status: AC | PRN
  Administered 2016-11-01: 40 mg via INTRA_ARTICULAR

## 2016-11-01 NOTE — Progress Notes (Deleted)
Office Visit Note   Patient: Carrie [name]           Date of Birth: Nov 03, 1970           MRN: [id number] Visit Date: 11/01/2016              Requested by: Carrie Rossetti, MD [address] [place], [place] [address] PCP: Carrie Samples, PA-C   Assessment & Plan: Visit Diagnoses: No diagnosis found.  Plan: ***  Follow-Up Instructions: No Follow-up on file.   Orders:  No orders of the defined types were placed in this encounter.  No orders of the defined types were placed in this encounter.     Procedures: Large Joint Inj Date/Time: 11/01/2016 8:59 AM Performed by: Carrie [name] Authorized by: Carrie [name]   Consent Given by:  Patient Indications:  Pain Location:  Shoulder Site:  R subacromial bursa Needle Size:  22 G Needle Length:  1.5 inches Approach:  Anterolateral Ultrasound Guidance: No   Fluoroscopic Guidance: No   Arthrogram: No   Medications:  40 mg methylPREDNISolone acetate 40 MG/ML; 4 mL bupivacaine 0.25 % Aspiration Attempted: No   Patient tolerance:  Patient tolerated the procedure well with no immediate complications     Clinical Data: No additional findings.   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 She's had no known injury. She denies any numbness tingling down the arm. Pain does awaken her. She's tried ibuprofen with no relief. She states the shoulder does crack and pop at times. Review of Systems Denies chest pain shortness breath fevers chills. Please see history of present illness.  Objective: Vital Signs: There were no vitals taken for this visit.  Physical Exam  Constitutional: She is oriented to person, place, and time. She appears well-developed and well-nourished. No distress.  Pulmonary/Chest: Effort normal.  Neurological: She is alert and oriented to  person, place, and time.  Skin: She is not diaphoretic.  Psychiatric: She has a normal mood and affect.    Ortho Exam Bilateral shoulders she has full forward flexion left shoulder. Right shoulder actively she comes to 150 160. Passively I bring her time and 10 this is uncomfortable. Positive impingement on the left negative on the right. Empty can test is negative bilaterally. 82 strengths with external and internal rotation against resistance bilaterally. Positive crossover test on the right negative on the left. Tenderness right acromioclavicular joint and trapezius region. Specialty Comments:  No specialty comments available.  Imaging: No results found.   PMFS History: Patient Active Problem List   Diagnosis Date Noted  . Neuroma of lower extremity 02/11/2011    Class: Chronic   Past Medical History:  Diagnosis Date  . Arthritis   . Hypertension   . No pertinent past medical history     Family History  Problem Relation Age of Onset  . Cancer Mother        breast  . Heart disease Maternal Grandmother   . Cancer Maternal Grandmother        breast  . Heart disease Paternal Grandfather   . Heart disease Father   . Other Father        liver problems  . Diabetes Brother   . Heart disease Maternal Grandfather     Past Surgical History:  Procedure Laterality Date  . ABDOMINAL HYSTERECTOMY    .  KNEE ARTHROSCOPY  2009  . MASS EXCISION  02/11/2011   Procedure: EXCISION MASS;  Surgeon: Carrie Oto, MD;  Location: [facility];  Service: Orthopedics;  Laterality: Right;  resection of neuroma right infrapatellar medial knee  . PATELLA ARTHROPLASTY     rt knee-  . TOTAL KNEE ARTHROPLASTY  2011   partial-[place] [facility]   . TUBAL LIGATION     Social History   Occupational History  . Not on file.   Social History Main Topics  . Smoking status: Never Smoker  . Smokeless tobacco: Never Used  . Alcohol use No  . Drug use: No  . Sexual activity: Yes     Birth control/ protection: Surgical     Comment: hyst

## 2016-11-01 NOTE — Progress Notes (Signed)
Office Visit Note   Patient: Carrie [name]           Date of Birth: 1970-03-07           MRN: [id number] Visit Date: 11/01/2016              Requested by: Jake Samples, PA-C [address] [place], [place] [address] PCP: Jake Samples, PA-C   Assessment & Plan: Visit Diagnoses:  1. Acute pain of right shoulder     Plan: Handout for shoulder exercises given and reviewed with patient. She'll continue her ibuprofen. Follow with [place] in 2 weeks' check progress lack of.  Follow-Up Instructions: Return in about 2 weeks (around 11/15/2016).   Orders:  Orders Placed This Encounter  Procedures  . Large Joint Injection/Arthrocentesis  . XR Shoulder Right   No orders of the defined types were placed in this encounter.     Procedures: Large Joint Inj Date/Time: 11/01/2016 9:14 AM Performed by: Pete Pelt Authorized by: Pete Pelt   Consent Given by:  Patient Indications:  Pain Location:  Shoulder Site:  R subacromial bursa Needle Size:  22 G Needle Length:  1.5 inches Approach:  Anterolateral Ultrasound Guidance: No   Fluoroscopic Guidance: No   Arthrogram: No   Medications:  40 mg methylPREDNISolone acetate 40 MG/ML; 4 mL bupivacaine 0.25 % Aspiration Attempted: No   Patient tolerance:  Patient tolerated the procedure well with no immediate complications     Clinical Data: No additional findings.   Subjective: Chief Complaint  Patient presents with  . 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 No known injury. Decreased range of motion strength. She denies a numbness tingling down the arm. Having cracking popping in the shoulder at times. Pain does awaken her. She's tried ibuprofen with no real relief. Review of Systems Denies chest pain joint was read fevers chills. Otherwise please see history of present  illness.  Objective: Vital Signs: There were no vitals taken for this visit.  Physical Exam  Constitutional: She is oriented to person, place, and time. She appears well-developed and well-nourished. No distress.  Pulmonary/Chest: Effort normal.  Neurological: She is alert and oriented to person, place, and time.  Skin: She is not diaphoretic.  Psychiatric: She has a normal mood and affect.    Ortho Exam Forward flexion left shoulder done 180 right shoulder 150 actively and passively I bring her to 180 but very intolerable. Positive impingement sign of the right negative on the left. 5 out of 5 strengths with external and internal rotation against resistance bilaterally. Positive abduction test on the right negative on the left. Tenderness over the right acromioclavicular joint and trapezius region only. Specialty Comments:  No specialty comments available.  Imaging: Xr Shoulder Right  Result Date: 11/01/2016 Right shoulder 3 views: No acute fracture. Shoulders well located. Subacromial space  well maintained. Mild-to-moderate acromioclavicular joint arthritic changes. No arthritic changes of the glenohumeral joint.    PMFS History: Patient Active Problem List   Diagnosis Date Noted  . Neuroma of lower extremity 02/11/2011    Class: Chronic   Past Medical History:  Diagnosis Date  . Arthritis   . Hypertension   . No pertinent past medical history     Family History  Problem Relation Age of Onset  . Cancer Mother  breast  . Heart disease Maternal Grandmother   . Cancer Maternal Grandmother        breast  . Heart disease Paternal Grandfather   . Heart disease Father   . Other Father        liver problems  . Diabetes Brother   . Heart disease Maternal Grandfather     Past Surgical History:  Procedure Laterality Date  . ABDOMINAL HYSTERECTOMY    . KNEE ARTHROSCOPY  2009  . MASS EXCISION  02/11/2011   Procedure: EXCISION MASS;  Surgeon: Jessy Oto, MD;   Location: [facility];  Service: Orthopedics;  Laterality: Right;  resection of neuroma right infrapatellar medial knee  . PATELLA ARTHROPLASTY     rt knee-  . TOTAL KNEE ARTHROPLASTY  2011   partial-[place] [facility]   . TUBAL LIGATION     Social History   Occupational History  . Not on file.   Social History Main Topics  . Smoking status: Never Smoker  . Smokeless tobacco: Never Used  . Alcohol use No  . Drug use: No  . Sexual activity: Yes    Birth control/ protection: Surgical     Comment: hyst

## 2016-11-15 ENCOUNTER — Other Ambulatory Visit (INDEPENDENT_AMBULATORY_CARE_PROVIDER_SITE_OTHER): Payer: Self-pay

## 2016-11-15 ENCOUNTER — Ambulatory Visit (INDEPENDENT_AMBULATORY_CARE_PROVIDER_SITE_OTHER): Payer: Managed Care, Other (non HMO) | Admitting: Physician Assistant

## 2016-11-15 DIAGNOSIS — M25511 Pain in right shoulder: Principal | ICD-10-CM

## 2016-11-15 DIAGNOSIS — G8929 Other chronic pain: Secondary | ICD-10-CM

## 2016-11-15 NOTE — Progress Notes (Signed)
Mrs. Carrie [name] returns today follow-up of the right shoulder. She states the injection only helped for about 5 hours and her pain came back. She continues to have pain with activities. She notes decreased range of motion of the shoulder. She denies any numbness tingling down the arm. Pain does awaken her. She's tried a home exercise program for the shoulder without any real relief.  Review of systems: Please see history of present illness otherwise negative  Physical exam: General well-developed well-nourished female in no acute distress. Mood and affect appropriate. Bilateral shoulders she has 5 out of 5 strengths the left shoulder with external and internal rotation against resistance. Weakness with external rotation of the right shoulder against resistance. Positive liftoff test on the right negative on the left. Positive impingement on the right.  Impression: Right shoulder pain  Plan: MRI right shoulder rule out rotator cuff tear. See her back after the MRI results and discuss further treatment. Questions encouraged and answered.

## 2016-11-29 ENCOUNTER — Ambulatory Visit
Admission: RE | Admit: 2016-11-29 | Discharge: 2016-11-29 | Disposition: A | Discharge status: Home / Self Care | Payer: 59 | Source: Ambulatory Visit | Attending: Orthopaedic Surgery | Admitting: Orthopaedic Surgery

## 2016-11-29 DIAGNOSIS — G8929 Other chronic pain: Secondary | ICD-10-CM | POA: Diagnosis present

## 2016-11-29 DIAGNOSIS — M25511 Pain in right shoulder: Secondary | ICD-10-CM | POA: Diagnosis present

## 2016-11-29 DIAGNOSIS — R609 Edema, unspecified: Secondary | ICD-10-CM | POA: Insufficient documentation

## 2016-12-01 ENCOUNTER — Ambulatory Visit (INDEPENDENT_AMBULATORY_CARE_PROVIDER_SITE_OTHER): Payer: Managed Care, Other (non HMO) | Admitting: Orthopaedic Surgery

## 2016-12-01 DIAGNOSIS — M25511 Pain in right shoulder: Secondary | ICD-10-CM | POA: Diagnosis not present

## 2016-12-01 NOTE — Progress Notes (Signed)
The patient is well-known to me. She is a 46 year old returns return for follow-up after an MRI of her right shoulder due to severe shoulder pain. We have tried a subacromial injection with the right shoulder and that did not help at all. Her pain is still quite severe and she points to the trapezius area and around the acromioclavicular joint as source for pain. She still denies any shoulder weakness. It is causing neck pain as well.  On exam she still has fluid range of motion of her right shoulder but is very painful to her. There is no evidence of muscle atrophy on inspection. Her rotator cuff itself feels strong but she does have a lot of give way pain. There is no redness about the shoulder. She has severe tenderness along trapezius and the acromioclavicular joint.  MRI of her shoulder shows moderate right acromioclavicular arthritis with edema in the distal clavicle and the acromion. The remainder of her shoulder MRI is normal. The rotator cuff is normal as well as the cartilage in the shoulder joint. The labrum is normal. There is no evidence of bursitis. The biceps tendon is normal.  Given the significant arthropathy of her right shoulder acromioclavicular joint, I would like to send her to Dr. Ernestina Patches for a fluoroscopically guided steroid injection in her right acromioclavicular joint. I would then like him to send her back to me anywhere from a week to 2 weeks after this injection. All questions concerns were answered and addressed.

## 2016-12-02 ENCOUNTER — Other Ambulatory Visit (INDEPENDENT_AMBULATORY_CARE_PROVIDER_SITE_OTHER): Payer: Self-pay

## 2016-12-02 DIAGNOSIS — M25511 Pain in right shoulder: Principal | ICD-10-CM

## 2016-12-02 DIAGNOSIS — G8929 Other chronic pain: Secondary | ICD-10-CM

## 2016-12-16 ENCOUNTER — Ambulatory Visit (INDEPENDENT_AMBULATORY_CARE_PROVIDER_SITE_OTHER): Payer: Managed Care, Other (non HMO)

## 2016-12-16 ENCOUNTER — Encounter (INDEPENDENT_AMBULATORY_CARE_PROVIDER_SITE_OTHER): Payer: Self-pay | Admitting: Physical Medicine and Rehabilitation

## 2016-12-16 ENCOUNTER — Ambulatory Visit (INDEPENDENT_AMBULATORY_CARE_PROVIDER_SITE_OTHER): Payer: Managed Care, Other (non HMO) | Admitting: Physical Medicine and Rehabilitation

## 2016-12-16 DIAGNOSIS — M19019 Primary osteoarthritis, unspecified shoulder: Secondary | ICD-10-CM

## 2016-12-16 DIAGNOSIS — M25511 Pain in right shoulder: Secondary | ICD-10-CM | POA: Diagnosis not present

## 2016-12-16 DIAGNOSIS — G8929 Other chronic pain: Secondary | ICD-10-CM | POA: Diagnosis not present

## 2016-12-16 NOTE — Progress Notes (Deleted)
Right shoulder pain with some right sided neck pain. Injection done by Dr. Ninfa Linden did not give her any relief.

## 2016-12-16 NOTE — Patient Instructions (Signed)

## 2016-12-16 NOTE — Progress Notes (Signed)
Carrie [name] - 46 y.o. female MRN [id number]  Date of birth: 02/26/1970  Office Visit Note: Visit Date: 12/16/2016 PCP: Jake Samples, PA-C Referred by: Jake Samples, PA*  Subjective: Chief Complaint  Patient presents with  . Right Shoulder - Pain   HPI: Carrie [name] is a right-hand-dominant 46 year old female with right shoulder pain.  MRI of the shoulder showed edema around the [facility] joint.  Dr. Ninfa Linden requested diagnostic and hopefully therapeutic right [facility] joint injection with fluoroscopic guidance.    ROS Otherwise per HPI.  Assessment & Plan: Visit Diagnoses:  1. Chronic right shoulder pain   2. AC joint arthropathy     Plan: Findings:  Fluoroscopic guidance was provided to place the needle in the [facility] joint.  Arthrogram was obtained.  Patient did seem to have some relief during the anesthetic phase.    Meds & Orders: No orders of the defined types were placed in this encounter.   Orders Placed This Encounter  Procedures  . Medium Joint Injection/Arthrocentesis  . XR C-ARM NO REPORT    Follow-up: Return if symptoms worsen or fail to improve, for Dr. Ninfa Linden.   Procedures: Medium Joint Inj Date/Time: 12/16/2016 8:24 AM Performed by: Magnus Sinning, MD Authorized by: Magnus Sinning, MD   Consent Given by:  Patient Site marked: the procedure site was marked   Timeout: prior to procedure the correct patient, procedure, and site was verified   Indications:  Pain and diagnostic evaluation Location:  Shoulder Site:  R acromioclavicular Prep: patient was prepped and draped in usual sterile fashion   Needle Size:  25 G Needle Length:  1.5 inches Ultrasound Guided: No   Fluoroscopic Guidance: Yes   Medications:  1 mL bupivacaine 0.5 %; 20 mg triamcinolone acetonide 40 MG/ML Aspiration Attempted: No   Patient tolerance:  Patient tolerated the procedure well with no immediate complications Comments: Fluoroscope was set up in straight AP fashion an  oblique slightly to obtain opening of the [facility] joint.  Used to position needle tip intra-articularly.  There was good end feel to the injection.  There was decent arthrogram performed.    No notes on file   Clinical History: No specialty comments available.  She reports that  has never smoked. she has never used smokeless tobacco. No results for input(s): HGBA1C, LABURIC in the last 8760 hours.  Objective:  VS:  HT:    WT:   BMI:     BP:   HR: bpm  TEMP: ( )  RESP:  Physical Exam  Ortho Exam Imaging: No results found.  Past Medical/Family/Surgical/Social History: Medications & Allergies reviewed per EMR Patient Active Problem List   Diagnosis Date Noted  . Acute pain of right shoulder 11/15/2016  . Neuroma of lower extremity 02/11/2011    Class: Chronic   Past Medical History:  Diagnosis Date  . Arthritis   . Hypertension   . No pertinent past medical history    Family History  Problem Relation Age of Onset  . Cancer Mother        breast  . Heart disease Maternal Grandmother   . Cancer Maternal Grandmother        breast  . Heart disease Paternal Grandfather   . Heart disease Father   . Other Father        liver problems  . Diabetes Brother   . Heart disease Maternal Grandfather    Past Surgical History:  Procedure Laterality Date  . ABDOMINAL HYSTERECTOMY    .  KNEE ARTHROSCOPY  2009  . PATELLA ARTHROPLASTY     rt knee-  . TOTAL KNEE ARTHROPLASTY  2011   partial-[place] [facility]   . TUBAL LIGATION     Social History   Occupational History  . Not on file  Tobacco Use  . Smoking status: Never Smoker  . Smokeless tobacco: Never Used  Substance and Sexual Activity  . Alcohol use: No    Alcohol/week: 0.0 oz  . Drug use: No  . Sexual activity: Yes    Birth control/protection: Surgical    Comment: hyst

## 2016-12-20 MED ORDER — TRIAMCINOLONE ACETONIDE 40 MG/ML IJ SUSP
20.0000 mg | INTRAMUSCULAR | Status: AC | PRN
  Administered 2016-12-16: 20 mg via INTRA_ARTICULAR

## 2016-12-20 MED ORDER — BUPIVACAINE HCL 0.5 % IJ SOLN
1.0000 mL | INTRAMUSCULAR | Status: AC | PRN
  Administered 2016-12-16: 1 mL via INTRA_ARTICULAR

## 2017-01-04 ENCOUNTER — Encounter (INDEPENDENT_AMBULATORY_CARE_PROVIDER_SITE_OTHER): Payer: Self-pay | Admitting: Orthopaedic Surgery

## 2017-01-04 ENCOUNTER — Ambulatory Visit (INDEPENDENT_AMBULATORY_CARE_PROVIDER_SITE_OTHER): Payer: Managed Care, Other (non HMO) | Admitting: Orthopaedic Surgery

## 2017-01-04 DIAGNOSIS — M19019 Primary osteoarthritis, unspecified shoulder: Secondary | ICD-10-CM | POA: Insufficient documentation

## 2017-01-04 NOTE — Progress Notes (Signed)
Ms. Chrismer returns today follow-up after a right shoulder acromioclavicular injection by Dr. Ernestina Patches on 12/16/2016.  She states the pain is not as bad as it was she feels that she is approximately 30-40% better.  She no longer has pain whenever she reaches across her chest with her right arm.  She denies any radicular symptoms down the arm.  She does have some pain that radiates up to her neck feels some stiffness in her neck at times.  Review of systems: Negative outside HPI Physical exam: General well-developed well-nourished female in no acute distress.  Cervical spine good range of motion cervical spine without pain.  Right shoulder she has full forward flexion and abduction without pain.  Abduction of the arm across the chest reveals a negative crossover test.  Impression: right shoulder AC joint arthropathy  Plan: Discussed with her giving this more time taking ibuprofen 600 mg 3 times daily as long as it does not raise her blood pressure and she needs to be mindful of this.  She did like to follow-up with [place] on an as-needed basis pain persist or becomes worse.  Discussed with her no heavy lifting overhead or push-ups pull-ups.  She may require a right shoulder arthroscopy with subacromial decompression and distal clavicle resection her pain persist despite conservative treatment and time.  At this point time she is not interested in any type of surgical intervention.

## 2017-08-16 ENCOUNTER — Ambulatory Visit (INDEPENDENT_AMBULATORY_CARE_PROVIDER_SITE_OTHER): Payer: BLUE CROSS/BLUE SHIELD | Admitting: Physician Assistant

## 2017-08-16 ENCOUNTER — Encounter (INDEPENDENT_AMBULATORY_CARE_PROVIDER_SITE_OTHER): Payer: Self-pay | Admitting: Physician Assistant

## 2017-08-16 ENCOUNTER — Ambulatory Visit (INDEPENDENT_AMBULATORY_CARE_PROVIDER_SITE_OTHER): Payer: BLUE CROSS/BLUE SHIELD

## 2017-08-16 DIAGNOSIS — M25561 Pain in right knee: Secondary | ICD-10-CM | POA: Diagnosis not present

## 2017-08-16 DIAGNOSIS — G8929 Other chronic pain: Secondary | ICD-10-CM

## 2017-08-16 NOTE — Progress Notes (Signed)
Office Visit Note   Patient: Carrie [name]           Date of Birth: 01-Aug-1970           MRN: [id number] Visit Date: 08/16/2017              Requested by: Jake Samples, PA-C [address] [place], [place] [address] PCP: Jake Samples, PA-C   Assessment & Plan: Visit Diagnoses:  1. 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  She will follow-up with [place] in 2 weeks to check her progress lack of.  Questions encouraged and answered at length.  Follow-Up Instructions: Return in about 2 weeks (around 08/30/2017).   Orders:  Orders Placed This Encounter  Procedures  . XR Knee 1-2 Views Right   No orders of the defined types were placed in this encounter.     Procedures: No procedures performed   Clinical Data: No additional findings.   Subjective: Chief Complaint  Patient presents with  . Right Knee - Pain    H/o partial knee arthroplasty    HPI Carrie [name] is well-known to Dr. Ninfa Linden service comes in today due to a new complaint right knee pain for the past 1/2 to 2 months.  No known injury.  Pain is medial posterior aspect of the knee is constant.  She describes giving way and locking of the knee.  She also notes some swelling in the knee.  She states her pain is 10 out of 10 pain at worst.  Has a history of a patellofemoral partial knee replacement 2010 or 2011.  She is tried ibuprofen with no real relief. Review of Systems Please see HPI otherwise negative  Objective: Vital Signs: There were no vitals taken for this visit.  Physical Exam  Constitutional: She is oriented to person, place, and time. She appears well-developed and well-nourished. No distress.  Pulmonary/Chest: Effort normal.  Neurological: She is alert and oriented to person, place, and time.  Skin: She is not diaphoretic.  Psychiatric: She has a normal mood and affect.    Ortho Exam Right knee she has full extension flexion to 105  degrees left knee she has full extension and flexion to 115 to 120 degrees.  Forced flexion of the right knee causes pain.  She has tenderness along medial joint line.  No effusion abnormal warmth erythema of either knee.  She does have slight edema of the right knee compared to left.  Calves are supple nontender.  Tenderness over the medial joint line of the right knee. Specialty Comments:  No specialty comments available.  Imaging: No results found.   PMFS History: Patient Active Problem List   Diagnosis Date Noted  . AC joint arthropathy 01/04/2017  . Acute pain of right shoulder 11/15/2016  . Neuroma of lower extremity 02/11/2011    Class: Chronic   Past Medical History:  Diagnosis Date  . Arthritis   . Hypertension   . No pertinent past medical history     Family History  Problem Relation Age of Onset  . Cancer Mother        breast  . Heart disease Maternal Grandmother   . Cancer Maternal Grandmother        breast  . Heart disease Paternal Grandfather   . Heart disease Father   . Other Father        liver problems  . Diabetes Brother   .  Heart disease Maternal Grandfather     Past Surgical History:  Procedure Laterality Date  . ABDOMINAL HYSTERECTOMY    . KNEE ARTHROSCOPY  2009  . MASS EXCISION  02/11/2011   Procedure: EXCISION MASS;  Surgeon: Jessy Oto, MD;  Location: [facility];  Service: Orthopedics;  Laterality: Right;  resection of neuroma right infrapatellar medial knee  . PATELLA ARTHROPLASTY     rt knee-  . TOTAL KNEE ARTHROPLASTY  2011   partial-[place] reg   . TUBAL LIGATION     Social History   Occupational History  . Not on file  Tobacco Use  . Smoking status: Never Smoker  . Smokeless tobacco: Never Used  Substance and Sexual Activity  . Alcohol use: No    Alcohol/week: 0.0 oz  . Drug use: No  . Sexual activity: Yes    Birth control/protection: Surgical    Comment: hyst

## 2017-08-30 ENCOUNTER — Encounter (INDEPENDENT_AMBULATORY_CARE_PROVIDER_SITE_OTHER): Payer: Self-pay | Admitting: Orthopaedic Surgery

## 2017-08-30 ENCOUNTER — Ambulatory Visit (INDEPENDENT_AMBULATORY_CARE_PROVIDER_SITE_OTHER): Payer: BLUE CROSS/BLUE SHIELD | Admitting: Orthopaedic Surgery

## 2017-08-30 DIAGNOSIS — G8929 Other chronic pain: Secondary | ICD-10-CM

## 2017-08-30 DIAGNOSIS — M25561 Pain in right knee: Secondary | ICD-10-CM | POA: Diagnosis not present

## 2017-08-30 NOTE — Progress Notes (Signed)
Patient is following up for her right knee.  She is continued to have significant medial joint line tenderness and pain along the medial collateral ligament and the joint line itself.  She has a complicated history of that knee in terms of having a patellofemoral arthroplasty done by another surgeon and later had her arthroscopic intervention for that knee due to a acute meniscal tear.  When she was here earlier this month we provided injection in the knee of a steroid and I did help some.  She still having a lot of locking catching in the knee and cramping as well as cracking and popping she states.  She is currently working but sits at her job due to this knee issue.  On exam she still has exquisite tenderness along the medial collateral ligament and the medial joint line on the right knee with a positive Murray sign of the medial side of her knee.  The patella itself seems to track well.  At this point we will obtain an MRI of her right knee to see if we can get a good assessment of the structures of her medial compartment to rule out any type of derangement.  I do feel this is warranted at this point given the locking catching and failed conservative treatment.  We will see her back once the MRI is obtained.

## 2017-08-30 NOTE — Addendum Note (Signed)
Addended by: Precious Bard on: 08/30/2017 10:29 AM   Modules accepted: Orders

## 2017-09-23 ENCOUNTER — Ambulatory Visit
Admission: RE | Admit: 2017-09-23 | Discharge: 2017-09-23 | Disposition: A | Discharge status: Home / Self Care | Payer: BLUE CROSS/BLUE SHIELD | Source: Ambulatory Visit | Attending: Orthopaedic Surgery | Admitting: Orthopaedic Surgery

## 2017-09-23 DIAGNOSIS — M25561 Pain in right knee: Principal | ICD-10-CM

## 2017-09-23 DIAGNOSIS — G8929 Other chronic pain: Secondary | ICD-10-CM

## 2017-10-09 ENCOUNTER — Encounter (INDEPENDENT_AMBULATORY_CARE_PROVIDER_SITE_OTHER): Payer: Self-pay | Admitting: Orthopaedic Surgery

## 2017-10-09 ENCOUNTER — Ambulatory Visit (INDEPENDENT_AMBULATORY_CARE_PROVIDER_SITE_OTHER): Payer: BLUE CROSS/BLUE SHIELD | Admitting: Orthopaedic Surgery

## 2017-10-09 DIAGNOSIS — M25561 Pain in right knee: Secondary | ICD-10-CM | POA: Diagnosis not present

## 2017-10-09 DIAGNOSIS — G8929 Other chronic pain: Secondary | ICD-10-CM | POA: Diagnosis not present

## 2017-10-09 DIAGNOSIS — M1711 Unilateral primary osteoarthritis, right knee: Secondary | ICD-10-CM | POA: Insufficient documentation

## 2017-10-09 NOTE — Progress Notes (Signed)
Office Visit Note   Patient: Carrie [name]           Date of Birth: 01/18/1971           MRN: [id number] Visit Date: 10/09/2017              Requested by: Jake Samples, PA-C [address] [place], [place] [address] PCP: Jake Samples, PA-C   Assessment & Plan: Visit Diagnoses:  1. Chronic pain of right knee   2. Unilateral primary osteoarthritis, right knee     Plan: Given the extent of cartilage loss in the medial lateral compartments of her knee I feel that the only other treatment for her to help with her pain would be a total knee arthroplasty.  She understands it would have to remove the patellofemoral aspect of things to replace the knee.  I do feel that with medial and lateral arthritic changes that at this point this type of surgery is warranted due to her continued pain.  I went over knee model explained in detail what this involves.  All question concerns were answered and addressed.  I talked about the risk and benefits of this type of surgery.  She is in a talkative with family look at her schedule.  I did give her our surgery scheduler's card to call when she like to consider having something like this done.  Follow-Up Instructions: Return for 2 weeks post-op.   Orders:  No orders of the defined types were placed in this encounter.  No orders of the defined types were placed in this encounter.     Procedures: No procedures performed   Clinical Data: No additional findings.   Subjective: Chief Complaint  Patient presents with  . Right Knee - Follow-up  The patient is here to go over an MRI of her right knee.  She is had chronic issues with her right knee for some time now.  She is only 47 years old has been having locking and catching.  She does have a patellofemoral arthroplasty on that knee as well.  HPI  Review of Systems She currently denies any headache, chest pain, shortness of breath, fever, chills, nausea,  vomiting.  Objective: Vital Signs: There were no vitals taken for this visit.  Physical Exam She is alert and oriented x3 and in no acute distress Ortho Exam Examination of her right knee does show full extension with flexion to about 100 degrees.  She has medial and lateral joint line tenderness.  There is a slight effusion. Specialty Comments:  No specialty comments available.  Imaging: No results found. I did review the MRI with her.  Her patellofemoral arthroplasty is intact with no complicating features.  However she has a large medial meniscal tear from the posterior horn to mid body.  She also has areas of full-thickness cartilage loss on the medial femoral condyle and the medial posterior tibial plateau as well as an area of full-thickness cartilage loss on the lateral femoral condyle and the lateral tibial plateau.  PMFS History: Patient Active Problem List   Diagnosis Date Noted  . Unilateral primary osteoarthritis, right knee 10/09/2017  . AC joint arthropathy 01/04/2017  . Acute pain of right shoulder 11/15/2016  . Neuroma of lower extremity 02/11/2011    Class: Chronic   Past Medical History:  Diagnosis Date  . Arthritis   . Hypertension   . No pertinent past medical history     Family History  Problem Relation Age of  Onset  . Cancer Mother        breast  . Heart disease Maternal Grandmother   . Cancer Maternal Grandmother        breast  . Heart disease Paternal Grandfather   . Heart disease Father   . Other Father        liver problems  . Diabetes Brother   . Heart disease Maternal Grandfather     Past Surgical History:  Procedure Laterality Date  . ABDOMINAL HYSTERECTOMY    . KNEE ARTHROSCOPY  2009  . MASS EXCISION  02/11/2011   Procedure: EXCISION MASS;  Surgeon: Jessy Oto, MD;  Location: [facility];  Service: Orthopedics;  Laterality: Right;  resection of neuroma right infrapatellar medial knee  . PATELLA ARTHROPLASTY     rt  knee-  . TOTAL KNEE ARTHROPLASTY  2011   partial-[place] reg   . TUBAL LIGATION     Social History   Occupational History  . Not on file  Tobacco Use  . Smoking status: Never Smoker  . Smokeless tobacco: Never Used  Substance and Sexual Activity  . Alcohol use: No    Alcohol/week: 0.0 standard drinks  . Drug use: No  . Sexual activity: Yes    Birth control/protection: Surgical    Comment: hyst

## 2017-12-25 DIAGNOSIS — E6609 Other obesity due to excess calories: Secondary | ICD-10-CM | POA: Diagnosis not present

## 2017-12-25 DIAGNOSIS — R609 Edema, unspecified: Secondary | ICD-10-CM | POA: Diagnosis not present

## 2017-12-25 DIAGNOSIS — J019 Acute sinusitis, unspecified: Secondary | ICD-10-CM | POA: Diagnosis not present

## 2017-12-25 DIAGNOSIS — I10 Essential (primary) hypertension: Secondary | ICD-10-CM | POA: Diagnosis not present

## 2017-12-25 DIAGNOSIS — Z6831 Body mass index (BMI) 31.0-31.9, adult: Secondary | ICD-10-CM | POA: Diagnosis not present

## 2017-12-25 DIAGNOSIS — R39198 Other difficulties with micturition: Secondary | ICD-10-CM | POA: Diagnosis not present

## 2017-12-25 DIAGNOSIS — R739 Hyperglycemia, unspecified: Secondary | ICD-10-CM | POA: Diagnosis not present

## 2018-01-25 IMAGING — MR MR SHOULDER*R* W/O CM
5 series · 40 of 40 positions shown · non-contrast
Comparison: Plain films right shoulder 11/01/2016.

CLINICAL DATA: Pain about the superior aspect of the right shoulder
for 2 months. No known injury.

EXAM:
MRI OF THE RIGHT SHOULDER WITHOUT CONTRAST
TECHNIQUE: Multiplanar, multisequence MR imaging of the shoulder was performed.
No intravenous contrast was administered.

[Series 4: T2 fat-sat · axial · 4.0mm · 0.47mm/px · z∈[-21,+85]mm · 8 of 25 slices shown (1 of 3)]
[im 1/25]
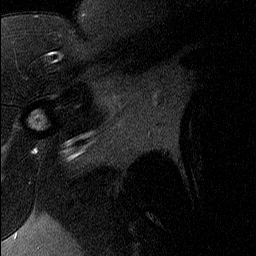
[im 4/25]
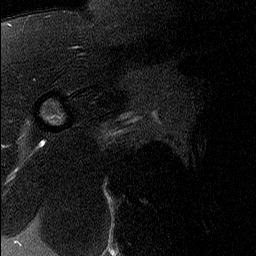
[im 7/25]
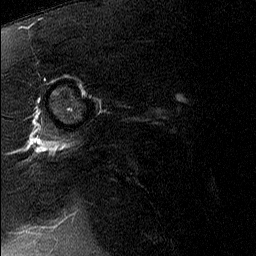
[im 11/25]
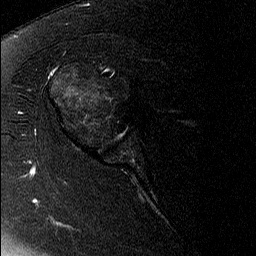
[im 14/25]
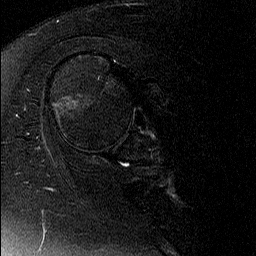
[im 18/25]
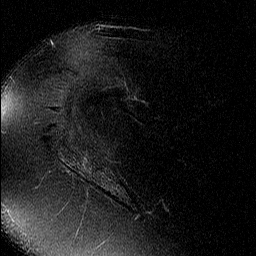
[im 21/25]
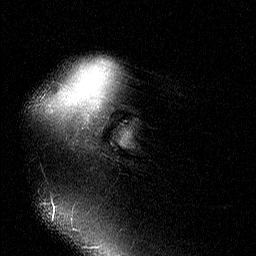
[im 25/25]
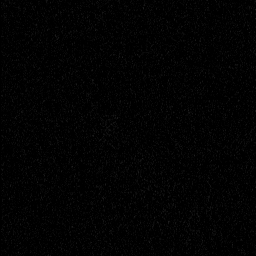

[Series 5: T2 fat-sat · oblique · 4.0mm · 0.62mm/px · 8 of 23 slices shown (2 of 3)]
[im 1/23]
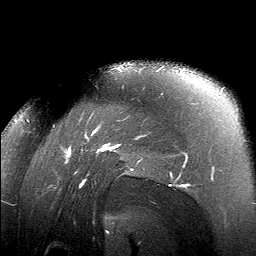
[im 4/23]
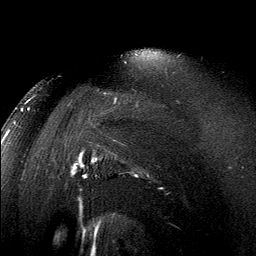
[im 7/23]
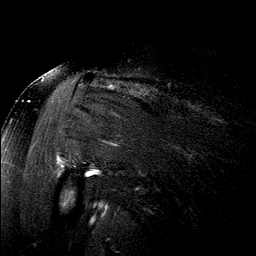
[im 10/23]
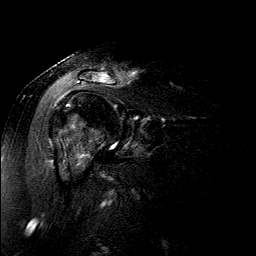
[im 13/23]
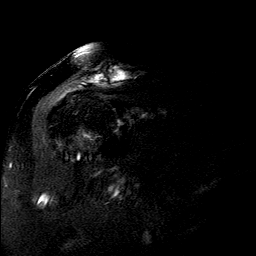
[im 16/23]
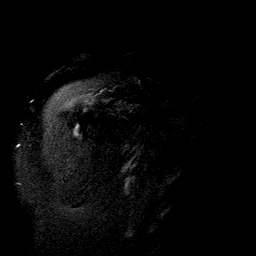
[im 19/23]
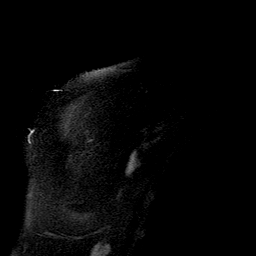
[im 23/23]
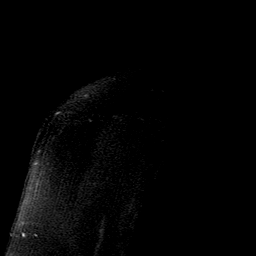

[Series 6: PD · oblique · 4.0mm · 0.62mm/px · 8 of 23 slices shown]
[im 1/23]
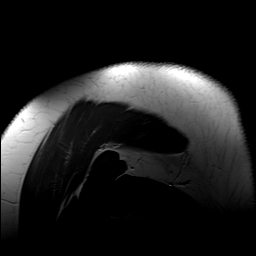
[im 4/23]
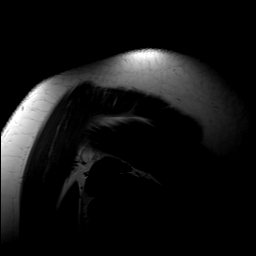
[im 7/23]
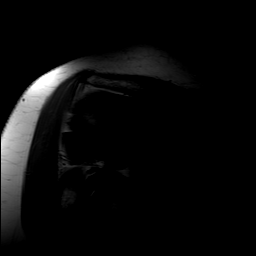
[im 10/23]
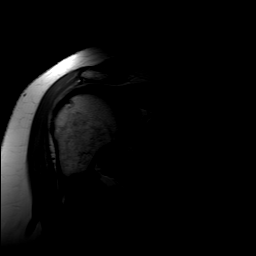
[im 13/23]
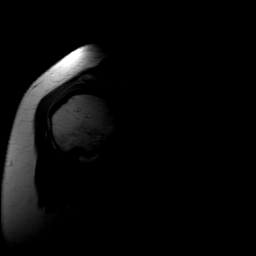
[im 16/23]
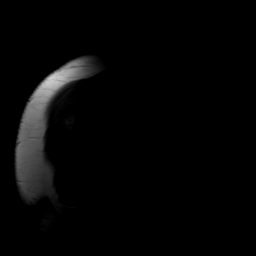
[im 19/23]
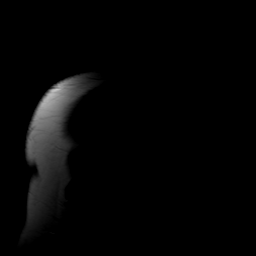
[im 23/23]
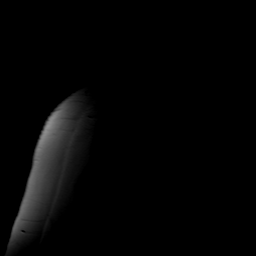

[Series 7: T1 · oblique · 4.0mm · 0.62mm/px · 8 of 23 slices shown]
[im 1/23]
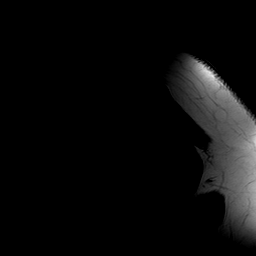
[im 4/23]
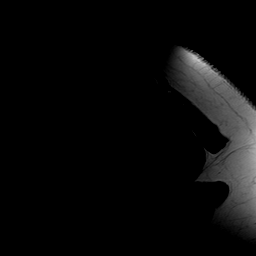
[im 7/23]
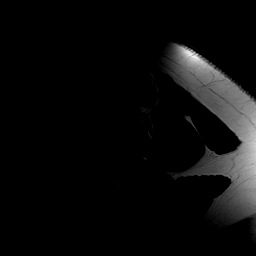
[im 10/23]
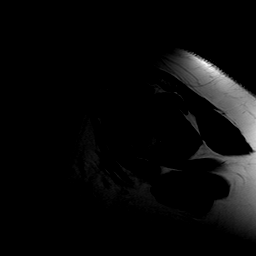
[im 13/23]
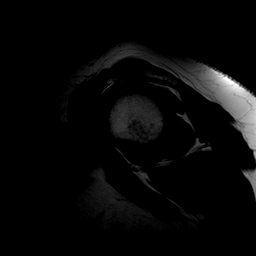
[im 16/23]
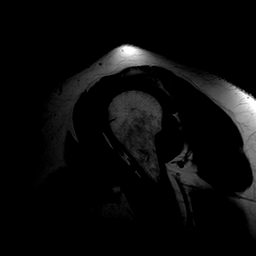
[im 19/23]
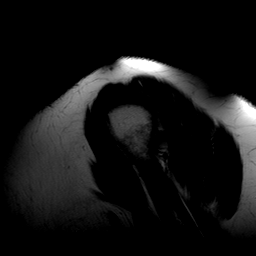
[im 23/23]
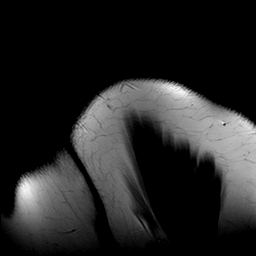

[Series 8: T2 fat-sat · oblique · 4.0mm · 0.62mm/px · 8 of 23 slices shown (3 of 3)]
[im 1/23]
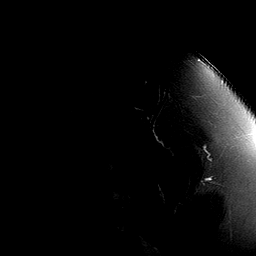
[im 4/23]
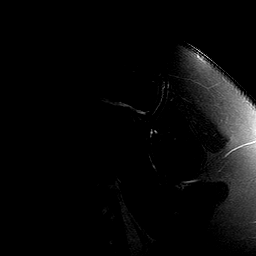
[im 7/23]
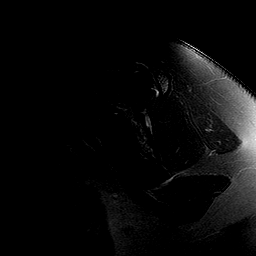
[im 10/23]
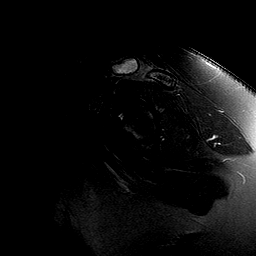
[im 13/23]
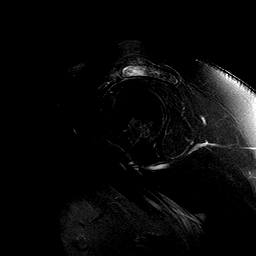
[im 16/23]
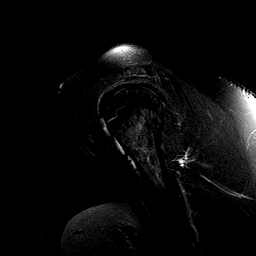
[im 19/23]
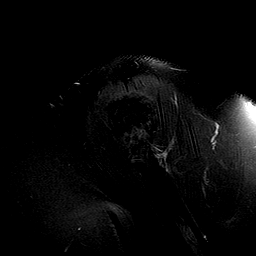
[im 23/23]
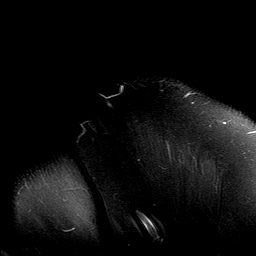

[40 of 40 positions shown; findings below may reference images not displayed]

FINDINGS: Rotator cuff:  Intact and normal in appearance.

Muscles:  Intact and normal in appearance.

Biceps long head:  Intact and normal in appearance.

Acromioclavicular Joint: The patient has moderate degenerative
disease but there is intense marrow edema about the joint. Type 2
acromion. No subacromial/subdeltoid bursal fluid.

Glenohumeral Joint: Appears normal.

Labrum:  Intact.

Bones:  No fracture or worrisome lesion.

Other: None.
IMPRESSION: Intense marrow edema about the acromioclavicular joint. The joint
appears moderately degenerated.

Intact and normal appearing rotator cuff and long head of biceps
tendon.

## 2018-03-20 DIAGNOSIS — Z1389 Encounter for screening for other disorder: Secondary | ICD-10-CM | POA: Diagnosis not present

## 2018-03-20 DIAGNOSIS — R51 Headache: Secondary | ICD-10-CM | POA: Diagnosis not present

## 2018-03-20 DIAGNOSIS — Z6832 Body mass index (BMI) 32.0-32.9, adult: Secondary | ICD-10-CM | POA: Diagnosis not present

## 2018-03-20 DIAGNOSIS — E6609 Other obesity due to excess calories: Secondary | ICD-10-CM | POA: Diagnosis not present

## 2018-03-20 DIAGNOSIS — E119 Type 2 diabetes mellitus without complications: Secondary | ICD-10-CM | POA: Diagnosis not present

## 2018-03-20 DIAGNOSIS — J0100 Acute maxillary sinusitis, unspecified: Secondary | ICD-10-CM | POA: Diagnosis not present

## 2018-07-26 ENCOUNTER — Other Ambulatory Visit (HOSPITAL_COMMUNITY): Payer: Self-pay | Admitting: Family Medicine

## 2018-07-26 ENCOUNTER — Other Ambulatory Visit: Payer: Self-pay | Admitting: Family Medicine

## 2018-07-26 DIAGNOSIS — R0989 Other specified symptoms and signs involving the circulatory and respiratory systems: Secondary | ICD-10-CM

## 2018-07-26 DIAGNOSIS — Z0001 Encounter for general adult medical examination with abnormal findings: Secondary | ICD-10-CM | POA: Diagnosis not present

## 2018-07-26 DIAGNOSIS — Z1389 Encounter for screening for other disorder: Secondary | ICD-10-CM | POA: Diagnosis not present

## 2018-07-26 DIAGNOSIS — Z6831 Body mass index (BMI) 31.0-31.9, adult: Secondary | ICD-10-CM | POA: Diagnosis not present

## 2018-07-26 DIAGNOSIS — R739 Hyperglycemia, unspecified: Secondary | ICD-10-CM | POA: Diagnosis not present

## 2018-07-26 DIAGNOSIS — E119 Type 2 diabetes mellitus without complications: Secondary | ICD-10-CM | POA: Diagnosis not present

## 2018-07-26 DIAGNOSIS — R600 Localized edema: Secondary | ICD-10-CM | POA: Diagnosis not present

## 2018-07-26 DIAGNOSIS — N951 Menopausal and female climacteric states: Secondary | ICD-10-CM | POA: Diagnosis not present

## 2018-07-26 DIAGNOSIS — E6609 Other obesity due to excess calories: Secondary | ICD-10-CM | POA: Diagnosis not present

## 2018-08-06 ENCOUNTER — Ambulatory Visit (HOSPITAL_COMMUNITY): Payer: BLUE CROSS/BLUE SHIELD

## 2018-08-06 DIAGNOSIS — Z20828 Contact with and (suspected) exposure to other viral communicable diseases: Secondary | ICD-10-CM | POA: Diagnosis not present

## 2018-08-06 DIAGNOSIS — U071 COVID-19: Secondary | ICD-10-CM | POA: Diagnosis not present

## 2018-08-07 ENCOUNTER — Ambulatory Visit (HOSPITAL_COMMUNITY): Payer: BC Managed Care – PPO

## 2018-08-10 ENCOUNTER — Ambulatory Visit (HOSPITAL_COMMUNITY): Payer: BC Managed Care – PPO

## 2018-08-10 ENCOUNTER — Ambulatory Visit (HOSPITAL_COMMUNITY)
Admission: RE | Admit: 2018-08-10 | Discharge: 2018-08-10 | Disposition: A | Discharge status: Home / Self Care | Payer: BC Managed Care – PPO | Source: Ambulatory Visit | Attending: Family Medicine | Admitting: Family Medicine

## 2018-08-10 ENCOUNTER — Other Ambulatory Visit: Payer: Self-pay

## 2018-08-10 DIAGNOSIS — R0989 Other specified symptoms and signs involving the circulatory and respiratory systems: Secondary | ICD-10-CM | POA: Insufficient documentation

## 2018-08-10 DIAGNOSIS — I6523 Occlusion and stenosis of bilateral carotid arteries: Secondary | ICD-10-CM | POA: Diagnosis not present

## 2018-08-15 DIAGNOSIS — E6609 Other obesity due to excess calories: Secondary | ICD-10-CM | POA: Diagnosis not present

## 2018-08-15 DIAGNOSIS — N342 Other urethritis: Secondary | ICD-10-CM | POA: Diagnosis not present

## 2018-08-15 DIAGNOSIS — J329 Chronic sinusitis, unspecified: Secondary | ICD-10-CM | POA: Diagnosis not present

## 2018-08-15 DIAGNOSIS — Z6832 Body mass index (BMI) 32.0-32.9, adult: Secondary | ICD-10-CM | POA: Diagnosis not present

## 2018-08-15 DIAGNOSIS — Z1389 Encounter for screening for other disorder: Secondary | ICD-10-CM | POA: Diagnosis not present

## 2018-08-24 ENCOUNTER — Ambulatory Visit (HOSPITAL_COMMUNITY): Payer: BC Managed Care – PPO

## 2018-09-12 DIAGNOSIS — E119 Type 2 diabetes mellitus without complications: Secondary | ICD-10-CM | POA: Diagnosis not present

## 2018-09-12 DIAGNOSIS — Z1389 Encounter for screening for other disorder: Secondary | ICD-10-CM | POA: Diagnosis not present

## 2018-09-12 DIAGNOSIS — Z6832 Body mass index (BMI) 32.0-32.9, adult: Secondary | ICD-10-CM | POA: Diagnosis not present

## 2018-09-12 DIAGNOSIS — J3489 Other specified disorders of nose and nasal sinuses: Secondary | ICD-10-CM | POA: Diagnosis not present

## 2018-09-12 DIAGNOSIS — R040 Epistaxis: Secondary | ICD-10-CM | POA: Diagnosis not present

## 2019-05-27 DIAGNOSIS — Z6831 Body mass index (BMI) 31.0-31.9, adult: Secondary | ICD-10-CM | POA: Diagnosis not present

## 2019-05-27 DIAGNOSIS — M5431 Sciatica, right side: Secondary | ICD-10-CM | POA: Diagnosis not present

## 2019-05-27 DIAGNOSIS — Z1389 Encounter for screening for other disorder: Secondary | ICD-10-CM | POA: Diagnosis not present

## 2019-05-27 DIAGNOSIS — I10 Essential (primary) hypertension: Secondary | ICD-10-CM | POA: Diagnosis not present

## 2019-05-27 DIAGNOSIS — E119 Type 2 diabetes mellitus without complications: Secondary | ICD-10-CM | POA: Diagnosis not present

## 2019-05-27 DIAGNOSIS — E6609 Other obesity due to excess calories: Secondary | ICD-10-CM | POA: Diagnosis not present

## 2019-06-18 DIAGNOSIS — E538 Deficiency of other specified B group vitamins: Secondary | ICD-10-CM | POA: Diagnosis not present

## 2019-06-18 DIAGNOSIS — J302 Other seasonal allergic rhinitis: Secondary | ICD-10-CM | POA: Diagnosis not present

## 2019-06-18 DIAGNOSIS — Z6831 Body mass index (BMI) 31.0-31.9, adult: Secondary | ICD-10-CM | POA: Diagnosis not present

## 2019-06-18 DIAGNOSIS — R5383 Other fatigue: Secondary | ICD-10-CM | POA: Diagnosis not present

## 2019-06-18 DIAGNOSIS — E119 Type 2 diabetes mellitus without complications: Secondary | ICD-10-CM | POA: Diagnosis not present

## 2019-06-18 DIAGNOSIS — E559 Vitamin D deficiency, unspecified: Secondary | ICD-10-CM | POA: Diagnosis not present

## 2019-06-18 DIAGNOSIS — I10 Essential (primary) hypertension: Secondary | ICD-10-CM | POA: Diagnosis not present

## 2019-06-27 ENCOUNTER — Other Ambulatory Visit (HOSPITAL_COMMUNITY): Payer: Self-pay | Admitting: Family Medicine

## 2019-06-27 DIAGNOSIS — Z1231 Encounter for screening mammogram for malignant neoplasm of breast: Secondary | ICD-10-CM

## 2019-09-16 DIAGNOSIS — E6609 Other obesity due to excess calories: Secondary | ICD-10-CM | POA: Diagnosis not present

## 2019-09-16 DIAGNOSIS — R319 Hematuria, unspecified: Secondary | ICD-10-CM | POA: Diagnosis not present

## 2019-09-16 DIAGNOSIS — N342 Other urethritis: Secondary | ICD-10-CM | POA: Diagnosis not present

## 2019-09-16 DIAGNOSIS — Z6831 Body mass index (BMI) 31.0-31.9, adult: Secondary | ICD-10-CM | POA: Diagnosis not present

## 2019-09-16 DIAGNOSIS — N390 Urinary tract infection, site not specified: Secondary | ICD-10-CM | POA: Diagnosis not present

## 2019-09-26 DIAGNOSIS — E6609 Other obesity due to excess calories: Secondary | ICD-10-CM | POA: Diagnosis not present

## 2019-09-26 DIAGNOSIS — Z6831 Body mass index (BMI) 31.0-31.9, adult: Secondary | ICD-10-CM | POA: Diagnosis not present

## 2019-09-26 DIAGNOSIS — I10 Essential (primary) hypertension: Secondary | ICD-10-CM | POA: Diagnosis not present

## 2019-09-26 DIAGNOSIS — Z01411 Encounter for gynecological examination (general) (routine) with abnormal findings: Secondary | ICD-10-CM | POA: Diagnosis not present

## 2019-09-26 DIAGNOSIS — E119 Type 2 diabetes mellitus without complications: Secondary | ICD-10-CM | POA: Diagnosis not present

## 2019-09-26 DIAGNOSIS — R1310 Dysphagia, unspecified: Secondary | ICD-10-CM | POA: Diagnosis not present

## 2019-09-27 DIAGNOSIS — E6609 Other obesity due to excess calories: Secondary | ICD-10-CM | POA: Diagnosis not present

## 2019-09-27 DIAGNOSIS — Z6831 Body mass index (BMI) 31.0-31.9, adult: Secondary | ICD-10-CM | POA: Diagnosis not present

## 2019-10-04 ENCOUNTER — Encounter (INDEPENDENT_AMBULATORY_CARE_PROVIDER_SITE_OTHER): Payer: Self-pay | Admitting: *Deleted

## 2019-11-01 ENCOUNTER — Other Ambulatory Visit (HOSPITAL_COMMUNITY): Payer: Self-pay | Admitting: Family Medicine

## 2019-11-01 DIAGNOSIS — Z1231 Encounter for screening mammogram for malignant neoplasm of breast: Secondary | ICD-10-CM

## 2019-11-12 ENCOUNTER — Encounter: Payer: Self-pay | Admitting: Internal Medicine

## 2019-12-11 ENCOUNTER — Ambulatory Visit: Payer: BC Managed Care – PPO | Admitting: Internal Medicine

## 2019-12-11 DIAGNOSIS — Z1159 Encounter for screening for other viral diseases: Secondary | ICD-10-CM | POA: Diagnosis not present

## 2019-12-11 DIAGNOSIS — R1310 Dysphagia, unspecified: Secondary | ICD-10-CM | POA: Diagnosis not present

## 2019-12-12 ENCOUNTER — Other Ambulatory Visit: Payer: Self-pay | Admitting: Physician Assistant

## 2019-12-12 DIAGNOSIS — R1310 Dysphagia, unspecified: Secondary | ICD-10-CM

## 2019-12-13 ENCOUNTER — Ambulatory Visit
Admission: RE | Admit: 2019-12-13 | Discharge: 2019-12-13 | Disposition: A | Discharge status: Home / Self Care | Payer: BC Managed Care – PPO | Source: Ambulatory Visit | Attending: Physician Assistant | Admitting: Physician Assistant

## 2019-12-13 DIAGNOSIS — R1310 Dysphagia, unspecified: Secondary | ICD-10-CM

## 2019-12-13 DIAGNOSIS — K449 Diaphragmatic hernia without obstruction or gangrene: Secondary | ICD-10-CM | POA: Diagnosis not present

## 2019-12-16 ENCOUNTER — Ambulatory Visit (INDEPENDENT_AMBULATORY_CARE_PROVIDER_SITE_OTHER): Payer: BC Managed Care – PPO | Admitting: Gastroenterology

## 2019-12-16 ENCOUNTER — Telehealth (INDEPENDENT_AMBULATORY_CARE_PROVIDER_SITE_OTHER): Payer: Self-pay

## 2019-12-16 NOTE — Telephone Encounter (Signed)
noted 

## 2019-12-17 DIAGNOSIS — K2289 Other specified disease of esophagus: Secondary | ICD-10-CM | POA: Diagnosis not present

## 2019-12-17 DIAGNOSIS — K200 Eosinophilic esophagitis: Secondary | ICD-10-CM | POA: Diagnosis not present

## 2019-12-17 DIAGNOSIS — R1310 Dysphagia, unspecified: Secondary | ICD-10-CM | POA: Diagnosis not present

## 2019-12-17 DIAGNOSIS — K2970 Gastritis, unspecified, without bleeding: Secondary | ICD-10-CM | POA: Diagnosis not present

## 2019-12-17 DIAGNOSIS — K2930 Chronic superficial gastritis without bleeding: Secondary | ICD-10-CM | POA: Diagnosis not present

## 2020-01-15 DIAGNOSIS — K200 Eosinophilic esophagitis: Secondary | ICD-10-CM | POA: Diagnosis not present

## 2020-01-15 DIAGNOSIS — Z1211 Encounter for screening for malignant neoplasm of colon: Secondary | ICD-10-CM | POA: Diagnosis not present

## 2020-03-17 DIAGNOSIS — K200 Eosinophilic esophagitis: Secondary | ICD-10-CM | POA: Diagnosis not present

## 2020-06-22 ENCOUNTER — Ambulatory Visit: Payer: BC Managed Care – PPO

## 2020-06-22 ENCOUNTER — Other Ambulatory Visit: Payer: Self-pay

## 2020-06-22 ENCOUNTER — Ambulatory Visit: Payer: BC Managed Care – PPO | Admitting: Podiatry

## 2020-06-22 ENCOUNTER — Ambulatory Visit: Payer: BLUE CROSS/BLUE SHIELD | Admitting: Podiatry

## 2020-06-22 ENCOUNTER — Encounter: Payer: Self-pay | Admitting: Podiatry

## 2020-06-22 DIAGNOSIS — D2371 Other benign neoplasm of skin of right lower limb, including hip: Secondary | ICD-10-CM | POA: Diagnosis not present

## 2020-06-22 DIAGNOSIS — B353 Tinea pedis: Secondary | ICD-10-CM | POA: Diagnosis not present

## 2020-06-22 DIAGNOSIS — R21 Rash and other nonspecific skin eruption: Secondary | ICD-10-CM | POA: Insufficient documentation

## 2020-06-22 DIAGNOSIS — J301 Allergic rhinitis due to pollen: Secondary | ICD-10-CM | POA: Insufficient documentation

## 2020-06-22 DIAGNOSIS — K200 Eosinophilic esophagitis: Secondary | ICD-10-CM | POA: Insufficient documentation

## 2020-06-22 MED ORDER — TERBINAFINE HCL 250 MG PO TABS: 250.0000 mg | ORAL_TABLET | Freq: Every day | ORAL | 0 refills | Status: DC

## 2020-06-22 NOTE — Progress Notes (Signed)
  Subjective:  Patient ID: Carrie [name], female    DOB: 1970/11/27,  MRN: [id number] HPI Chief Complaint  Patient presents with  . Callouses    Plantar forefoot right - 2 tiny callused areas x few months, feels thick when walking, larger callus sub 2nd met "no problems with that one", trims occasionally  . New Patient (Initial Visit)  . Diabetes    Last a1c was 6.5     50 y.o. female presents with the above complaint.   ROS: Denies fever chills nausea vomiting muscle aches pains calf pain back pain chest pain shortness of breath.  Past Medical History:  Diagnosis Date  . Arthritis   . Hypertension   . No pertinent past medical history    Past Surgical History:  Procedure Laterality Date  . ABDOMINAL HYSTERECTOMY    . KNEE ARTHROSCOPY  2009  . MASS EXCISION  02/11/2011   Procedure: EXCISION MASS;  Surgeon: Jessy Oto, MD;  Location: [facility];  Service: Orthopedics;  Laterality: Right;  resection of neuroma right infrapatellar medial knee  . PATELLA ARTHROPLASTY     rt knee-  . TOTAL KNEE ARTHROPLASTY  2011   partial-[place] reg   . TUBAL LIGATION      Current Outpatient Medications:  .  terbinafine (LAMISIL) 250 MG tablet, Take 1 tablet (250 mg total) by mouth daily., Disp: 30 tablet, Rfl: 0 .  losartan (COZAAR) 100 MG tablet, , Disp: , Rfl:  .  metFORMIN (GLUCOPHAGE-XR) 500 MG 24 hr tablet, SMARTSIG:1 Tablet(s) By Mouth Every Evening, Disp: , Rfl:  .  pantoprazole (PROTONIX) 40 MG tablet, Take 1 tablet by mouth daily., Disp: , Rfl:   Allergies  Allergen Reactions  . Codeine Other (See Comments)    headaches headaches   Review of Systems Objective:  There were no vitals filed for this visit.  General: Well developed, nourished, in no acute distress, alert and oriented x3   Dermatological: Skin is warm, dry and supple bilateral. Nails x 10 are well maintained; remaining integument appears unremarkable at this time. There are no open sores, no  preulcerative lesions, no rash or signs of infection present.  2 solitary porokeratotic lesions beneath the fourth and fifth metatarsals of the right foot no open lesions or wounds are noted.  She does have what appears to be onychomycosis to the hallux nails of the left foot with tinea pedis.  Vascular: Dorsalis Pedis artery and Posterior Tibial artery pedal pulses are 2/4 bilateral with immedate capillary fill time. Pedal hair growth present. No varicosities and no lower extremity edema present bilateral.   Neruologic: Grossly intact via light touch bilateral. Vibratory intact via tuning fork bilateral. Protective threshold with Semmes Wienstein monofilament intact to all pedal sites bilateral. Patellar and Achilles deep tendon reflexes 2+ bilateral. No Babinski or clonus noted bilateral.   Musculoskeletal: No gross boney pedal deformities bilateral. No pain, crepitus, or limitation noted with foot and ankle range of motion bilateral. Muscular strength 5/5 in all groups tested bilateral.  Gait: Unassisted, Nonantalgic.    Radiographs:  None taken  Assessment & Plan:   Assessment: Tinea pedis and poor keratomas  Plan: Start her on Lamisil 250 mg tablets she will take 1 tablet a day for the next 30 days.  I also debrided the reactive hyperkeratotic lesions over the benign skin lesions today.  I also recommended orthotics and she will scheduled for those.     Jeyren Danowski T. [place], [place]

## 2020-07-23 DIAGNOSIS — Z6830 Body mass index (BMI) 30.0-30.9, adult: Secondary | ICD-10-CM | POA: Diagnosis not present

## 2020-07-23 DIAGNOSIS — E6609 Other obesity due to excess calories: Secondary | ICD-10-CM | POA: Diagnosis not present

## 2020-07-23 DIAGNOSIS — Z1331 Encounter for screening for depression: Secondary | ICD-10-CM | POA: Diagnosis not present

## 2020-07-23 DIAGNOSIS — G43909 Migraine, unspecified, not intractable, without status migrainosus: Secondary | ICD-10-CM | POA: Diagnosis not present

## 2020-07-23 DIAGNOSIS — R946 Abnormal results of thyroid function studies: Secondary | ICD-10-CM | POA: Diagnosis not present

## 2020-07-23 DIAGNOSIS — Z1389 Encounter for screening for other disorder: Secondary | ICD-10-CM | POA: Diagnosis not present

## 2020-07-23 DIAGNOSIS — I10 Essential (primary) hypertension: Secondary | ICD-10-CM | POA: Diagnosis not present

## 2020-07-23 DIAGNOSIS — E118 Type 2 diabetes mellitus with unspecified complications: Secondary | ICD-10-CM | POA: Diagnosis not present

## 2020-07-23 DIAGNOSIS — J302 Other seasonal allergic rhinitis: Secondary | ICD-10-CM | POA: Diagnosis not present

## 2020-08-03 ENCOUNTER — Other Ambulatory Visit: Payer: Self-pay

## 2020-08-03 ENCOUNTER — Ambulatory Visit: Payer: BC Managed Care – PPO | Admitting: Podiatry

## 2020-08-03 ENCOUNTER — Encounter: Payer: Self-pay | Admitting: Podiatry

## 2020-08-03 DIAGNOSIS — D2371 Other benign neoplasm of skin of right lower limb, including hip: Secondary | ICD-10-CM

## 2020-08-03 DIAGNOSIS — B353 Tinea pedis: Secondary | ICD-10-CM

## 2020-08-03 MED ORDER — TERBINAFINE HCL 250 MG PO TABS: 250.0000 mg | ORAL_TABLET | Freq: Every day | ORAL | 0 refills | Status: DC

## 2020-08-03 NOTE — Progress Notes (Signed)
She presents today for follow-up of her tinea pedis and her calluses.  She states that the tinea pedis is doing much better and the calluses are still painful.  Objective: Vital signs are stable she is alert and oriented x3 I debrided the remainder of the poor keratomas today after chemical debridement.  They are looking very good for her.  I feel that she is going do much better at this time.  The tinea pedis is nearly resolved.  Assessment: Tinea pedis resolving.  Soft tissue lesions.  Plan: Debrided benign skin lesions today and also write another prescription for Lamisil.

## 2020-09-14 ENCOUNTER — Ambulatory Visit: Payer: BC Managed Care – PPO | Admitting: Podiatry

## 2020-09-22 ENCOUNTER — Ambulatory Visit: Payer: BC Managed Care – PPO | Admitting: Podiatry

## 2020-10-12 ENCOUNTER — Other Ambulatory Visit: Payer: Self-pay

## 2020-10-12 ENCOUNTER — Ambulatory Visit: Payer: BC Managed Care – PPO | Admitting: Podiatry

## 2020-10-12 ENCOUNTER — Encounter: Payer: Self-pay | Admitting: Podiatry

## 2020-10-12 DIAGNOSIS — D2371 Other benign neoplasm of skin of right lower limb, including hip: Secondary | ICD-10-CM | POA: Diagnosis not present

## 2020-10-12 NOTE — Progress Notes (Signed)
She presents today for follow-up of her benign skin lesions plantar aspect forefoot right.  States that they have really been hurting.  Objective: Porokeratotic lesions do not demonstrate any verrucoid characteristics submetatarsal third and fourth right foot.  Assessment: Benign skin lesions forefoot right.  Plan: Sharp debridement followed by chemical debridement Cilicaine under occlusion for 2 to 3 days Dry then washed thoroughly.  We did also discussed the possible need or desire to resect these curettaged him the next time she comes.

## 2020-11-09 ENCOUNTER — Telehealth: Payer: Self-pay | Admitting: Orthopaedic Surgery

## 2020-11-09 NOTE — Telephone Encounter (Signed)
Called patient left message on voicemail to return call to schedule an appointment with Dr. Ninfa Linden or Artis Delay    Note: Patient was last seen 2019.

## 2020-11-30 ENCOUNTER — Ambulatory Visit: Payer: BC Managed Care – PPO | Admitting: Physician Assistant

## 2020-11-30 ENCOUNTER — Encounter: Payer: Self-pay | Admitting: Physician Assistant

## 2020-11-30 ENCOUNTER — Ambulatory Visit: Payer: Self-pay

## 2020-11-30 DIAGNOSIS — M1711 Unilateral primary osteoarthritis, right knee: Secondary | ICD-10-CM

## 2020-11-30 NOTE — Progress Notes (Signed)
Office Visit Note   Patient: Carrie [name]           Date of Birth: Mar 15, 1970           MRN: [id number] Visit Date: 11/30/2020              Requested by: Jake Samples, PA-C [address] [place],   [address] PCP: Jake Samples, PA-C   Assessment & Plan: Visit Diagnoses:  1. Unilateral primary osteoarthritis, right knee     Plan: Given patient's significant arthritis and the fact that she has failed conservative treatment, again recommend right knee arthroplasty.  She understands that we will need to remove the components from the patellofemoral arthroplasty that she had in the past.  We discussed with her risk benefits of surgery.  Also discussed postoperative protocol.  Risk include but are not limited to DVT/PE, wound healing problems, infection, nerve vessel injury, prolonged pain and worsening pain.  She would like to proceed with surgery sometime after Christmas near the first of the year.  We will work on scheduling her for this and see her back 2 weeks postop.  Follow-Up Instructions: No follow-ups on file.   Orders:  Orders Placed This Encounter  Procedures   XR Knee 1-2 Views Right   No orders of the defined types were placed in this encounter.     Procedures: No procedures performed   Clinical Data: No additional findings.   Subjective: Chief Complaint  Patient presents with   Right Knee - Pain    HPI Carrie [name] is a 50 year old female well-known to Dr. Ninfa Linden service comes in today with right knee pain.  She is last seen in 2019 and at that time discussed undergoing right total knee arthroplasty however she did not follow-up with [place].  She has been taking Aleve and ibuprofen for the knee pain but this is not helping much.  She does have a home exercise program.  She said no recent injections in the knee.  History of patellofemoral replacement some 12 years ago.  She has had no new falls or injuries.  Feels her pain overall is getting  worse.  She is diabetic reports her last hemoglobin A1c was 6.8. MRI in the past that shown large medial meniscal tear from posterior horn to the mid body.  She also has full-thickness cartilage loss on the medial femoral condyle and posterior tibial plateau.  As well as areas of full-thickness cartilage loss involving the lateral femoral condyle and lateral tibial plateau.  Review of Systems  Constitutional:  Negative for chills and fever.  Respiratory:  Negative for shortness of breath.   Cardiovascular:  Negative for chest pain.    Objective: Vital Signs: There were no vitals taken for this visit.  Physical Exam Constitutional:      Appearance: She is not ill-appearing or diaphoretic.  Pulmonary:     Effort: Pulmonary effort is normal.  Neurological:     Mental Status: She is alert and oriented to person, place, and time.  Psychiatric:        Mood and Affect: Mood normal.    Ortho Exam Right knee well-healed surgical incision.  She has full extension flexion to about 100 1005 degrees.  She has tenderness along medial lateral joint line.  Crepitus with passive range of motion of the knee no abnormal warmth erythema or effusion. Specialty Comments:  No specialty comments available.  Imaging: No results found.   PMFS History: Patient Active Problem List  Diagnosis Date Noted   Allergic rhinitis due to pollen 50/27/7412   Eosinophilic esophagitis 87/86/7672   Rash and other nonspecific skin eruption 06/22/2020   Unilateral primary osteoarthritis, right knee 10/09/2017   AC joint arthropathy 01/04/2017   Acute pain of right shoulder 11/15/2016   Other derangements of patella, unspecified knee 09/02/2015   Recurrent dislocation of right patella 09/02/2015   Breast hypertrophy in female 05/13/2015   Neuroma of lower extremity 02/11/2011    Class: Chronic   Past Medical History:  Diagnosis Date   Arthritis    Hypertension    No pertinent past medical history      Family History  Problem Relation Age of Onset   Cancer Mother        breast   Heart disease Maternal Grandmother    Cancer Maternal Grandmother        breast   Heart disease Paternal Grandfather    Heart disease Father    Other Father        liver problems   Diabetes Brother    Heart disease Maternal Grandfather     Past Surgical History:  Procedure Laterality Date   ABDOMINAL HYSTERECTOMY     KNEE ARTHROSCOPY  2009   MASS EXCISION  02/11/2011   Procedure: EXCISION MASS;  Surgeon: Jessy Oto, MD;  Location: [facility];  Service: Orthopedics;  Laterality: Right;  resection of neuroma right infrapatellar medial knee   PATELLA ARTHROPLASTY     rt knee-   TOTAL KNEE ARTHROPLASTY  2011   partial-[place] reg    TUBAL LIGATION     Social History   Occupational History   Not on file  Tobacco Use   Smoking status: Never   Smokeless tobacco: Never  Substance and Sexual Activity   Alcohol use: No    Alcohol/week: 0.0 standard drinks   Drug use: No   Sexual activity: Yes    Birth control/protection: Surgical    Comment: hyst

## 2020-12-09 ENCOUNTER — Other Ambulatory Visit: Payer: Self-pay | Admitting: Physician Assistant

## 2020-12-09 MED ORDER — TRAMADOL HCL 50 MG PO TABS: 50.0000 mg | ORAL_TABLET | Freq: Four times a day (QID) | ORAL | 0 refills | Status: DC | PRN

## 2020-12-25 DIAGNOSIS — Z6830 Body mass index (BMI) 30.0-30.9, adult: Secondary | ICD-10-CM | POA: Diagnosis not present

## 2020-12-25 DIAGNOSIS — Z0001 Encounter for general adult medical examination with abnormal findings: Secondary | ICD-10-CM | POA: Diagnosis not present

## 2020-12-25 DIAGNOSIS — E039 Hypothyroidism, unspecified: Secondary | ICD-10-CM | POA: Diagnosis not present

## 2020-12-25 DIAGNOSIS — Z13220 Encounter for screening for lipoid disorders: Secondary | ICD-10-CM | POA: Diagnosis not present

## 2020-12-25 DIAGNOSIS — E118 Type 2 diabetes mellitus with unspecified complications: Secondary | ICD-10-CM | POA: Diagnosis not present

## 2020-12-25 DIAGNOSIS — E6609 Other obesity due to excess calories: Secondary | ICD-10-CM | POA: Diagnosis not present

## 2020-12-25 DIAGNOSIS — Z124 Encounter for screening for malignant neoplasm of cervix: Secondary | ICD-10-CM | POA: Diagnosis not present

## 2020-12-25 DIAGNOSIS — I10 Essential (primary) hypertension: Secondary | ICD-10-CM | POA: Diagnosis not present

## 2020-12-25 DIAGNOSIS — Z0000 Encounter for general adult medical examination without abnormal findings: Secondary | ICD-10-CM | POA: Diagnosis not present

## 2020-12-25 DIAGNOSIS — R946 Abnormal results of thyroid function studies: Secondary | ICD-10-CM | POA: Diagnosis not present

## 2020-12-25 DIAGNOSIS — E119 Type 2 diabetes mellitus without complications: Secondary | ICD-10-CM | POA: Diagnosis not present

## 2020-12-25 DIAGNOSIS — Z780 Asymptomatic menopausal state: Secondary | ICD-10-CM | POA: Diagnosis not present

## 2021-01-06 DIAGNOSIS — R079 Chest pain, unspecified: Secondary | ICD-10-CM | POA: Diagnosis not present

## 2021-01-06 DIAGNOSIS — E119 Type 2 diabetes mellitus without complications: Secondary | ICD-10-CM | POA: Diagnosis not present

## 2021-01-06 DIAGNOSIS — E7800 Pure hypercholesterolemia, unspecified: Secondary | ICD-10-CM | POA: Diagnosis not present

## 2021-01-06 DIAGNOSIS — R55 Syncope and collapse: Secondary | ICD-10-CM | POA: Diagnosis not present

## 2021-01-06 DIAGNOSIS — E860 Dehydration: Secondary | ICD-10-CM | POA: Diagnosis not present

## 2021-01-06 DIAGNOSIS — I951 Orthostatic hypotension: Secondary | ICD-10-CM | POA: Diagnosis not present

## 2021-01-06 DIAGNOSIS — R519 Headache, unspecified: Secondary | ICD-10-CM | POA: Diagnosis not present

## 2021-01-06 DIAGNOSIS — Z7982 Long term (current) use of aspirin: Secondary | ICD-10-CM | POA: Diagnosis not present

## 2021-01-06 DIAGNOSIS — Z780 Asymptomatic menopausal state: Secondary | ICD-10-CM | POA: Diagnosis not present

## 2021-01-06 DIAGNOSIS — E785 Hyperlipidemia, unspecified: Secondary | ICD-10-CM | POA: Diagnosis not present

## 2021-01-06 DIAGNOSIS — R112 Nausea with vomiting, unspecified: Secondary | ICD-10-CM | POA: Diagnosis not present

## 2021-01-06 DIAGNOSIS — Z7984 Long term (current) use of oral hypoglycemic drugs: Secondary | ICD-10-CM | POA: Diagnosis not present

## 2021-01-06 DIAGNOSIS — Z20822 Contact with and (suspected) exposure to covid-19: Secondary | ICD-10-CM | POA: Diagnosis not present

## 2021-01-06 DIAGNOSIS — Z79899 Other long term (current) drug therapy: Secondary | ICD-10-CM | POA: Diagnosis not present

## 2021-01-06 DIAGNOSIS — Z6830 Body mass index (BMI) 30.0-30.9, adult: Secondary | ICD-10-CM | POA: Diagnosis not present

## 2021-01-06 DIAGNOSIS — I10 Essential (primary) hypertension: Secondary | ICD-10-CM | POA: Diagnosis not present

## 2021-01-07 DIAGNOSIS — R55 Syncope and collapse: Secondary | ICD-10-CM | POA: Diagnosis not present

## 2021-01-07 DIAGNOSIS — I10 Essential (primary) hypertension: Secondary | ICD-10-CM | POA: Diagnosis not present

## 2021-01-07 DIAGNOSIS — R112 Nausea with vomiting, unspecified: Secondary | ICD-10-CM | POA: Diagnosis not present

## 2021-01-07 DIAGNOSIS — E860 Dehydration: Secondary | ICD-10-CM | POA: Diagnosis not present

## 2021-01-07 DIAGNOSIS — I951 Orthostatic hypotension: Secondary | ICD-10-CM | POA: Diagnosis not present

## 2021-01-13 DIAGNOSIS — Z6830 Body mass index (BMI) 30.0-30.9, adult: Secondary | ICD-10-CM | POA: Diagnosis not present

## 2021-01-13 DIAGNOSIS — Z0000 Encounter for general adult medical examination without abnormal findings: Secondary | ICD-10-CM | POA: Diagnosis not present

## 2021-01-13 DIAGNOSIS — R55 Syncope and collapse: Secondary | ICD-10-CM | POA: Diagnosis not present

## 2021-01-13 DIAGNOSIS — E6609 Other obesity due to excess calories: Secondary | ICD-10-CM | POA: Diagnosis not present

## 2021-01-13 DIAGNOSIS — E118 Type 2 diabetes mellitus with unspecified complications: Secondary | ICD-10-CM | POA: Diagnosis not present

## 2021-01-13 DIAGNOSIS — I10 Essential (primary) hypertension: Secondary | ICD-10-CM | POA: Diagnosis not present

## 2021-01-21 DIAGNOSIS — G40209 Localization-related (focal) (partial) symptomatic epilepsy and epileptic syndromes with complex partial seizures, not intractable, without status epilepticus: Secondary | ICD-10-CM | POA: Diagnosis not present

## 2021-01-21 DIAGNOSIS — R55 Syncope and collapse: Secondary | ICD-10-CM | POA: Diagnosis not present

## 2021-01-21 DIAGNOSIS — R519 Headache, unspecified: Secondary | ICD-10-CM | POA: Diagnosis not present

## 2021-01-25 ENCOUNTER — Telehealth: Payer: Self-pay

## 2021-01-25 NOTE — Telephone Encounter (Signed)
Due to recent events, as described in patient's MyChart message, anesthesia is requiring cardiac and neurology clearance before patient can have knee surgery.  Cancelling TKA on 02-16-21 and patient will contact me back to reschedule once work up is done and she is cleared.

## 2021-01-28 ENCOUNTER — Other Ambulatory Visit: Payer: Self-pay | Admitting: Neurology

## 2021-01-28 ENCOUNTER — Other Ambulatory Visit (HOSPITAL_COMMUNITY): Payer: Self-pay | Admitting: Neurology

## 2021-01-28 DIAGNOSIS — G40209 Localization-related (focal) (partial) symptomatic epilepsy and epileptic syndromes with complex partial seizures, not intractable, without status epilepticus: Secondary | ICD-10-CM

## 2021-02-03 ENCOUNTER — Encounter: Payer: Self-pay | Admitting: *Deleted

## 2021-02-04 ENCOUNTER — Telehealth: Payer: Self-pay | Admitting: Cardiology

## 2021-02-04 ENCOUNTER — Ambulatory Visit: Payer: BC Managed Care – PPO | Admitting: Cardiology

## 2021-02-04 ENCOUNTER — Encounter: Payer: Self-pay | Admitting: Cardiology

## 2021-02-04 ENCOUNTER — Ambulatory Visit (INDEPENDENT_AMBULATORY_CARE_PROVIDER_SITE_OTHER): Payer: BC Managed Care – PPO

## 2021-02-04 ENCOUNTER — Telehealth: Payer: Self-pay | Admitting: *Deleted

## 2021-02-04 ENCOUNTER — Other Ambulatory Visit: Payer: Self-pay | Admitting: Cardiology

## 2021-02-04 VITALS — BP 138/84 | HR 90 | Ht 59.0 in | Wt 157.2 lb

## 2021-02-04 DIAGNOSIS — I10 Essential (primary) hypertension: Secondary | ICD-10-CM

## 2021-02-04 DIAGNOSIS — Z87898 Personal history of other specified conditions: Secondary | ICD-10-CM | POA: Diagnosis not present

## 2021-02-04 DIAGNOSIS — Z01810 Encounter for preprocedural cardiovascular examination: Secondary | ICD-10-CM | POA: Diagnosis not present

## 2021-02-04 DIAGNOSIS — R55 Syncope and collapse: Secondary | ICD-10-CM | POA: Diagnosis not present

## 2021-02-04 LAB — ECHOCARDIOGRAM COMPLETE
Area-P 1/2: 5.13 cm2
Calc EF: 55 %
Height: 59 in
S' Lateral: 2.65 cm
Single Plane A2C EF: 59 %
Single Plane A4C EF: 52.5 %
Weight: 2515.2 oz

## 2021-02-04 NOTE — Telephone Encounter (Signed)
-----   Message from Satira Sark, MD sent at 02/04/2021  3:14 PM EST ----- Results reviewed.  Echocardiogram today was reassuring.  LVEF is normal at 55 to 60% and there is no clear evidence of previous inferior wall infarct scar.  Mild mitral regurgitation would not be expected to cause symptoms.  Continue with plan for cardiac monitor.

## 2021-02-04 NOTE — Patient Instructions (Addendum)
Medication Instructions:  Your physician recommends that you continue on your current medications as directed. Please refer to the Current Medication list given to you today.  Labwork: none  Testing/Procedures: Your physician has requested that you have an echocardiogram. Echocardiography is a painless test that uses sound waves to create images of your heart. It provides your doctor with information about the size and shape of your heart and how well your hearts chambers and valves are working. This procedure takes approximately one hour. There are no restrictions for this procedure. Your physician has recommended that you wear a long term heart monitor for 14 days. Event monitors are medical devices that record the hearts electrical activity. Doctors most often [place] these monitors to diagnose arrhythmias. Arrhythmias are problems with the speed or rhythm of the heartbeat. The monitor is a small, portable device. You can wear one while you do your normal daily activities. This is usually used to diagnose what is causing palpitations/syncope (passing out). [employer] will mail this monitor you your home address  Follow-Up: Your physician recommends that you schedule a follow-up appointment in: pending  Any Other Special Instructions Will Be Listed Below (If Applicable).  If you need a refill on your cardiac medications before your next appointment, please call your pharmacy.

## 2021-02-04 NOTE — Progress Notes (Signed)
Cardiology Office Note  Date: 02/04/2021   ID: Carrie [name], DOB Jul 18, 1970, MRN [id number]  PCP:  Jake Samples, PA-C  Cardiologist:  Rozann Lesches, MD Electrophysiologist:  None   Chief Complaint  Patient presents with   History of syncope    History of Present Illness: Carrie [name] is a 50 y.o. female referred for cardiology consultation by Dr. Gerarda Fraction for evaluation of reported syncope.  I reviewed the available records.  She was evaluated at [facility] in November after three episodes of brief syncope/near syncope. She was found to be orthostatic and felt to be potentially dehydrated, given fluids with improvement in symptoms.  Telemetry was reportedly unremarkable.  High-sensitivity troponin I levels were normal.  Noncontrasted head CT showed no acute abnormalities.  Chest x-ray was unremarkable. She had been on losartan which was held initially, but resumed at discharge.  She saw Dr. Gerarda Fraction in follow-up and sent for further evaluation.    We discussed her symptoms today.  She states that this first episode occurred while she was seated at work, had gotten back from lunch and was feeling nauseated, realized that she had laid her head down on the desk and apparently had lost consciousness for some period of time.  Following that she had 3 episodes of emesis and while she was standing had another episode of near syncope, helped down by a coworker.  EMS was summoned and reportedly checked her out with recommendation to see her PCP the next day.  She saw her PCP and was told that her ECG was abnormal and that she needed to be seen in the ER.  While seated and waiting to be seen at [facility], she had another brief episode of apparent syncope.  She tells her that she has never had any events similar to this in the past.  She does mention that she has an intense "burning" smell when these episodes have happened.  No obvious history of seizures.  Orthostatic measurements  were negative today.  No diagnostic drop in blood pressure or diagnostic increase in heart rate.  I personally reviewed her ECG today which shows normal sinus rhythm, Q in lead III, technically nondiagnostic for prior infarct and could be normal variant.  She is scheduled for neurology evaluation as well.  She also mentions that she is to undergo right total knee arthroplasty with Dr. Ninfa Linden, surgery is on hold right now given the above symptoms.  Past Medical History:  Diagnosis Date   Essential hypertension    GERD (gastroesophageal reflux disease)    Mixed hyperlipidemia     Past Surgical History:  Procedure Laterality Date   ABDOMINAL HYSTERECTOMY     KNEE ARTHROSCOPY  2009   MASS EXCISION  02/11/2011   Procedure: EXCISION MASS;  Surgeon: Jessy Oto, MD;  Location: [place];  Service: Orthopedics;  Laterality: Right;  resection of neuroma right infrapatellar medial knee   PATELLA ARTHROPLASTY     rt knee-   TOTAL KNEE ARTHROPLASTY  2011   partial-[place] reg    TUBAL LIGATION      Current Outpatient Medications  Medication Sig Dispense Refill   aspirin 81 MG EC tablet Bayer Low Dose Aspirin 81 mg tablet,delayed release  Take 1 tablet every day by oral route.     estradiol (CLIMARA - DOSED IN MG/24 HR) 0.1 mg/24hr patch Place 0.1 mg onto the skin once a week.     losartan (COZAAR) 100 MG tablet  metFORMIN (GLUCOPHAGE) 1000 MG tablet Take 1 tablet by mouth 2 (two) times daily.     pantoprazole (PROTONIX) 40 MG tablet Take 1 tablet by mouth daily.     traMADol (ULTRAM) 50 MG tablet Take 1 tablet (50 mg total) by mouth every 6 (six) hours as needed. 30 tablet 0   No current facility-administered medications for this visit.   Allergies:  Codeine   Social History: The patient  reports that she has never smoked. She has never used smokeless tobacco. She reports that she does not drink alcohol and does not use drugs.   Family History: The patient's  family history includes Breast cancer in her mother; Heart disease in her father; Prostate cancer in her father.   ROS: No palpitations or chest pain.  Physical Exam: VS:  BP 138/84    Pulse 90    Ht 4\' 11"  (1.499 m)    Wt 157 lb 3.2 oz (71.3 kg)    SpO2 98%    BMI 31.75 kg/m , BMI Body mass index is 31.75 kg/m.  Wt Readings from Last 3 Encounters:  02/04/21 157 lb 3.2 oz (71.3 kg)  06/09/14 157 lb (71.2 kg)  05/14/14 157 lb (71.2 kg)    General: Patient appears comfortable at rest. HEENT: Conjunctiva and lids normal, wearing a mask. Neck: Supple, no elevated JVP or carotid bruits, no thyromegaly. Lungs: Clear to auscultation, nonlabored breathing at rest. Cardiac: Regular rate and rhythm, no S3 or significant systolic murmur, no pericardial rub. Abdomen: Soft, nontender, bowel sounds present. Extremities: No pitting edema, distal pulses 2+. Skin: Warm and dry. Musculoskeletal: No kyphosis. Neuropsychiatric: Alert and oriented x3, affect grossly appropriate.  ECG:  An ECG dated 01/06/2021 was personally reviewed today and demonstrated:  Sinus rhythm with decreased R wave progression.  Recent Labwork:  November 2022: Hemoglobin 13.7, platelets 259, cholesterol 148, triglycerides 99, HDL 52, LDL 78 December 2022: BUN 10, creatinine 0.68, potassium 4.5, AST 16, ALT 30  Other Studies Reviewed Today:  Carotid Doppler 08/10/2018: IMPRESSION: Minor carotid atherosclerosis. No hemodynamically significant ICA stenosis. Degree of narrowing less than 50% bilaterally by ultrasound criteria.   Patent antegrade vertebral flow bilaterally  Assessment and Plan:  1.  History of syncope as discussed above, also pending neurology evaluation to evaluate for possible seizures.  Episodes do not sound arrhythmogenic based on description, more likely vasovagal.  Given description of intense "burning" smell with these events would also proceed with neurology evaluation.  Her ECG is essentially  normal with a Q wave in lead III that is most likely normal variant and nondiagnostic for prior inferior infarct.  Tracing from PCP was similar.  She was not orthostatic today.  Plan is to obtain an echocardiogram to ensure structurally normal heart and also place a 7-day Zio patch to investigate heart rate and rhythm.  Further recommendations to follow.  2.  Essential hypertension, blood pressure stable with orthostatic measurements today.  She is on Cozaar.  Medication Adjustments/Labs and Tests Ordered: Current medicines are reviewed at length with the patient today.  Concerns regarding medicines are outlined above.   Tests Ordered: Orders Placed This Encounter  Procedures   EKG 12-Lead   ECHOCARDIOGRAM COMPLETE    Medication Changes: No orders of the defined types were placed in this encounter.   Disposition:  Follow up  test results.  Signed, Satira Sark, MD, [facility] 02/04/2021 1:57 PM    [facility] [place], [place], [place]  [place] Phone: [phone]; Fax: [phone]

## 2021-02-04 NOTE — Telephone Encounter (Signed)
Patient informed. Copy sent to PCP °

## 2021-02-04 NOTE — Telephone Encounter (Signed)
PERCERT:  7 Day ZIO AT/

## 2021-02-09 DIAGNOSIS — G40209 Localization-related (focal) (partial) symptomatic epilepsy and epileptic syndromes with complex partial seizures, not intractable, without status epilepticus: Secondary | ICD-10-CM | POA: Diagnosis not present

## 2021-02-10 ENCOUNTER — Other Ambulatory Visit: Payer: Self-pay

## 2021-02-10 ENCOUNTER — Ambulatory Visit (HOSPITAL_COMMUNITY)
Admission: RE | Admit: 2021-02-10 | Discharge: 2021-02-10 | Disposition: A | Discharge status: Home / Self Care | Payer: BC Managed Care – PPO | Source: Ambulatory Visit | Attending: Neurology | Admitting: Neurology

## 2021-02-10 DIAGNOSIS — R55 Syncope and collapse: Secondary | ICD-10-CM | POA: Diagnosis not present

## 2021-02-10 DIAGNOSIS — R569 Unspecified convulsions: Secondary | ICD-10-CM | POA: Diagnosis not present

## 2021-02-10 DIAGNOSIS — G40209 Localization-related (focal) (partial) symptomatic epilepsy and epileptic syndromes with complex partial seizures, not intractable, without status epilepticus: Secondary | ICD-10-CM | POA: Diagnosis not present

## 2021-02-10 MED ORDER — GADOBUTROL 1 MMOL/ML IV SOLN
7.0000 mL | Freq: Once | INTRAVENOUS | Status: AC | PRN
  Administered 2021-02-10: 16:00:00 7 mL via INTRAVENOUS

## 2021-02-16 ENCOUNTER — Ambulatory Visit: Admit: 2021-02-16 | Payer: BC Managed Care – PPO | Admitting: Orthopaedic Surgery

## 2021-02-16 SURGERY — ARTHROPLASTY, KNEE, TOTAL
Anesthesia: Choice | Site: Knee | Laterality: Right

## 2021-02-17 ENCOUNTER — Encounter: Payer: Self-pay | Admitting: Cardiology

## 2021-02-18 ENCOUNTER — Telehealth: Payer: Self-pay | Admitting: *Deleted

## 2021-02-18 NOTE — Telephone Encounter (Signed)
-----   Message from Satira Sark, MD sent at 02/17/2021 11:41 AM EST ----- Results reviewed.  Cardiac monitor was reassuring overall.  Heart rhythm normal with rare atrial and ventricular ectopy.  Three brief episodes of SVT were noted, but there were no sustained arrhythmias or pauses that would necessarily correlate with syncope.  Not entirely clear that any medications need to be added at this point.  Would continue with plans for neurology evaluation.

## 2021-02-18 NOTE — Telephone Encounter (Signed)
° °  Pre-operative Risk Assessment    Patient Name: Carrie [name]  DOB: 12/29/1970 MRN: [id number]      Request for Surgical Clearance    Procedure:  Right total knee arthroplasty  Date of Surgery:  Clearance TBD                                 Surgeon:  Jean Rosenthal Surgeon's Group or Practice Name:  [facility] Phone number:  [phone] Fax number:  [phone]   Type of Clearance Requested:   - Medical    Type of Anesthesia:  Spinal   Additional requests/questions:  none  SignedMarlou Sa   02/18/2021, 2:06 PM

## 2021-02-18 NOTE — Telephone Encounter (Signed)
° °  Patient Name: Carrie [name]  DOB: Mar 08, 1970 MRN: [id number]  Primary Cardiologist: Rozann Lesches, MD  Chart revisited as part of pre-operative protocol coverage.   Per Dr. Domenic Polite, " Subsequent echocardiogram and cardiac monitor were overall reassuring from a cardiac perspective and do not necessarily point toward a cardiac etiology for her possible syncopal events.  There was concern about possible seizure evaluation and she was supposed to have formal neurology evaluation as well, I do not have this information.  Would not necessarily anticipate further cardiac work-up prior to any surgery at this point however."  Will route this bundled recommendation to requesting provider via Epic fax function. Please call with questions.  Charlie Pitter, PA-C 02/18/2021, 5:01 PM

## 2021-02-18 NOTE — Telephone Encounter (Signed)
Patient informed. Copy sent to PCP °

## 2021-02-18 NOTE — Telephone Encounter (Signed)
° °  Name: Carrie [name]  DOB: [birth year]  MRN: [id number]   Primary Cardiologist: Rozann Lesches, MD  Chart reviewed as part of pre-operative protocol coverage. Patient was contacted 02/18/2021 in reference to pre-operative risk assessment for pending surgery as outlined below.    Carrie [name] was last seen on 02/04/21 by Dr. Domenic Polite for evaluation of syncope. He referenced that knee surgery was on hold due to needing further evaluation.   Echo 02/04/21 EF 55-60%, mild asymmetric basal septal hypertrophy, normal RV, mild MR. Monitor showed NSR, rare PACs, PVCs, 3 episodes of SVT, no sustained arrhythmias or pauses that would correlate with syncope. Has not previously required ischemic evaluation. Will route to Dr. Domenic Polite to inquire whether patient is ready to proceed with knee surgery under spinal anesthesia from his standpoint. Dr. Domenic Polite - Please route response to P CV DIV PREOP (the pre-op pool). Thank you.  Charlie Pitter, PA-C 02/18/2021, 4:10 PM

## 2021-02-24 DIAGNOSIS — R519 Headache, unspecified: Secondary | ICD-10-CM | POA: Diagnosis not present

## 2021-02-24 DIAGNOSIS — G40209 Localization-related (focal) (partial) symptomatic epilepsy and epileptic syndromes with complex partial seizures, not intractable, without status epilepticus: Secondary | ICD-10-CM | POA: Diagnosis not present

## 2021-02-24 DIAGNOSIS — R55 Syncope and collapse: Secondary | ICD-10-CM | POA: Diagnosis not present

## 2021-03-02 ENCOUNTER — Encounter: Payer: BC Managed Care – PPO | Admitting: Orthopaedic Surgery

## 2021-03-16 ENCOUNTER — Other Ambulatory Visit: Payer: Self-pay | Admitting: Physician Assistant

## 2021-03-16 DIAGNOSIS — M1711 Unilateral primary osteoarthritis, right knee: Secondary | ICD-10-CM

## 2021-03-25 NOTE — Patient Instructions (Addendum)
DUE TO COVID-19 ONLY ONE VISITOR IS ALLOWED TO COME WITH YOU AND STAY IN THE WAITING ROOM ONLY DURING PRE OP AND PROCEDURE.   **NO VISITORS ARE ALLOWED IN THE SHORT STAY AREA OR RECOVERY ROOM!!**  IF YOU WILL BE ADMITTED INTO THE HOSPITAL YOU ARE ALLOWED ONLY TWO SUPPORT PEOPLE DURING VISITATION HOURS ONLY (7 AM -8PM)   The support person(s) must pass our screening, gel in and out, and wear a mask at all times, including in the patients room. Patients must also wear a mask when staff or their support person are in the room. Visitors GUEST BADGE MUST BE WORN VISIBLY  One adult visitor may remain with you overnight and MUST be in the room by 8 P.M.  No visitors under the age of 22. Any visitor under the age of 20 must be accompanied by an adult.    COVID SWAB TESTING MUST BE COMPLETED ON:  03/31/21 @ 8:15 AM   Site: [facility] [place] Enter: Main Entrance have a seat in the waiting area to the right of main entrance (DO NOT [place]!!!!!) Dial: [phone] to alert staff you have arrived  You are not required to quarantine, however you are required to wear a well-fitted mask when you are out and around people not in your household.  Hand Hygiene often Do NOT share personal items Notify your provider if you are in close contact with someone who has COVID or you develop fever 100.4 or greater, new onset of sneezing, cough, sore throat, shortness of breath or body aches.       Your procedure is scheduled on: 04/02/21   Report to [facility] Main Entrance    Report to admitting at 10:45 AM   Call this number if you have problems the morning of surgery [phone]   Do not eat food :After Midnight.   May have liquids until 10:30 AM day of surgery  CLEAR LIQUID DIET  Foods Allowed                                                                     Foods Excluded  Water, Black Coffee and tea, regular and decaf                              liquids that you cannot  Plain Jell-O in any flavor  (No red)                                           see through such as: Fruit ices (not with fruit pulp)                                     milk, soups, orange juice              Iced Popsicles (No red)  All solid food                                   Apple juices Sports drinks like Gatorade (No red) Lightly seasoned clear broth or consume(fat free) Sugar    The day of surgery:  Drink ONE (1) Pre-Surgery G2 at 10:30 AM the morning of surgery. Drink in one sitting. Do not sip.  This drink was given to you during your hospital  pre-op appointment visit. Nothing else to drink after completing the  Pre-Surgery G2.          If you have questions, please contact your surgeons office.  FOLLOW BOWEL PREP AND ANY ADDITIONAL PRE OP INSTRUCTIONS YOU RECEIVED FROM YOUR SURGEON'S OFFICE!!!     Oral Hygiene is also important to reduce your risk of infection.                                    Remember - BRUSH YOUR TEETH THE MORNING OF SURGERY WITH YOUR REGULAR TOOTHPASTE   Call primary care doctor and ask for instructions regarding Aspirin before surgery   Take these medicines the morning of surgery with A SIP OF WATER: Pantoprazole, Tramadol   DO NOT TAKE ANY ORAL DIABETIC MEDICATIONS DAY OF YOUR SURGERY  How to Manage Your Diabetes Before and After Surgery  Why is it important to control my blood sugar before and after surgery? Improving blood sugar levels before and after surgery helps healing and can limit problems. A way of improving blood sugar control is eating a healthy diet by:  Eating less sugar and carbohydrates  Increasing activity/exercise  Talking with your doctor about reaching your blood sugar goals High blood sugars (greater than 180 mg/dL) can raise your risk of infections and slow your recovery, so you will need to focus on controlling your diabetes during the weeks before  surgery. Make sure that the doctor who takes care of your diabetes knows about your planned surgery including the date and location.  How do I manage my blood sugar before surgery? Check your blood sugar at least 4 times a day, starting 2 days before surgery, to make sure that the level is not too high or low. Check your blood sugar the morning of your surgery when you wake up and every 2 hours until you get to the Short Stay unit. If your blood sugar is less than 70 mg/dL, you will need to treat for low blood sugar: Do not take insulin. Treat a low blood sugar (less than 70 mg/dL) with  cup of clear juice (cranberry or apple), 4 glucose tablets, OR glucose gel. Recheck blood sugar in 15 minutes after treatment (to make sure it is greater than 70 mg/dL). If your blood sugar is not greater than 70 mg/dL on recheck, call [phone] for further instructions. Report your blood sugar to the short stay nurse when you get to Short Stay.  If you are admitted to the hospital after surgery: Your blood sugar will be checked by the staff and you will probably be given insulin after surgery (instead of oral diabetes medicines) to make sure you have good blood sugar levels. The goal for blood sugar control after surgery is 80-180 mg/dL.   WHAT DO I DO ABOUT MY DIABETES MEDICATION?  Do not take oral diabetes medicines (pills) the morning of  surgery.  THE DAY BEFORE SURGERY, take Metformin as prescribed       THE MORNING OF SURGERY, do not take Metformin   Reviewed and Endorsed by [facility] Patient Education Committee, August 2015                               You may not have any metal on your body including hair pins, jewelry, and body piercing             Do not wear make-up, lotions, powders, perfumes, or deodorant  Do not wear nail polish including gel and S&S, artificial/acrylic nails, or any other type of covering on natural nails including finger and toenails. If you have artificial  nails, gel coating, etc. that needs to be removed by a nail salon please have this removed prior to surgery or surgery may need to be canceled/ delayed if the surgeon/ anesthesia feels like they are unable to be safely monitored.   Do not shave  48 hours prior to surgery.    Do not bring valuables to the hospital. [place].   Contacts, dentures or bridgework may not be worn into surgery.   Bring small overnight bag day of surgery.              Please read over the following fact sheets you were given: IF YOU HAVE QUESTIONS ABOUT YOUR PRE-OP INSTRUCTIONS PLEASE CALL [facility] - Preparing for Surgery Before surgery, you can play an important role.  Because skin is not sterile, your skin needs to be as free of germs as possible.  You can reduce the number of germs on your skin by washing with CHG (chlorahexidine gluconate) soap before surgery.  CHG is an antiseptic cleaner which kills germs and bonds with the skin to continue killing germs even after washing. Please DO NOT use if you have an allergy to CHG or antibacterial soaps.  If your skin becomes reddened/irritated stop using the CHG and inform your nurse when you arrive at Short Stay. Do not shave (including legs and underarms) for at least 48 hours prior to the first CHG shower.  You may shave your face/neck.  Please follow these instructions carefully:  1.  Shower with CHG Soap the night before surgery and the  morning of surgery.  2.  If you choose to wash your hair, wash your hair first as usual with your normal  shampoo.  3.  After you shampoo, rinse your hair and body thoroughly to remove the shampoo.                             4.  Use CHG as you would any other liquid soap.  You can apply chg directly to the skin and wash.  Gently with a scrungie or clean washcloth.  5.  Apply the CHG Soap to your body ONLY FROM THE NECK DOWN.   Do   not use on face/ open                            Wound or open sores. Avoid contact with eyes, ears mouth and   genitals (private parts).  Wash face,  Genitals (private parts) with your normal soap.             6.  Wash thoroughly, paying special attention to the area where your    surgery  will be performed.  7.  Thoroughly rinse your body with warm water from the neck down.  8.  DO NOT shower/wash with your normal soap after using and rinsing off the CHG Soap.                9.  Pat yourself dry with a clean towel.            10.  Wear clean pajamas.            11.  Place clean sheets on your bed the night of your first shower and do not  sleep with pets. Day of Surgery : Do not apply any lotions/deodorants the morning of surgery.  Please wear clean clothes to the hospital/surgery center.  FAILURE TO FOLLOW THESE INSTRUCTIONS MAY RESULT IN THE CANCELLATION OF YOUR SURGERY  PATIENT SIGNATURE_________________________________  NURSE SIGNATURE__________________________________  ________________________________________________________________________   Carrie [name]  An incentive spirometer is a tool that can help keep your lungs clear and active. This tool measures how well you are filling your lungs with each breath. Taking long deep breaths may help reverse or decrease the chance of developing breathing (pulmonary) problems (especially infection) following: A long period of time when you are unable to move or be active. BEFORE THE PROCEDURE  If the spirometer includes an indicator to show your best effort, your nurse or respiratory therapist will set it to a desired goal. If possible, sit up straight or lean slightly forward. Try not to slouch. Hold the incentive spirometer in an upright position. INSTRUCTIONS FOR USE  Sit on the edge of your bed if possible, or sit up as far as you can in bed or on a chair. Hold the incentive spirometer in an upright position. Breathe out  normally. Place the mouthpiece in your mouth and seal your lips tightly around it. Breathe in slowly and as deeply as possible, raising the piston or the ball toward the top of the column. Hold your breath for 3-5 seconds or for as long as possible. Allow the piston or ball to fall to the bottom of the column. Remove the mouthpiece from your mouth and breathe out normally. Rest for a few seconds and repeat Steps 1 through 7 at least 10 times every 1-2 hours when you are awake. Take your time and take a few normal breaths between deep breaths. The spirometer may include an indicator to show your best effort. Use the indicator as a goal to work toward during each repetition. After each set of 10 deep breaths, practice coughing to be sure your lungs are clear. If you have an incision (the cut made at the time of surgery), support your incision when coughing by placing a pillow or rolled up towels firmly against it. Once you are able to get out of bed, walk around indoors and cough well. You may stop using the incentive spirometer when instructed by your caregiver.  RISKS AND COMPLICATIONS Take your time so you do not get dizzy or light-headed. If you are in pain, you may need to take or ask for pain medication before doing incentive spirometry. It is harder to take a deep breath if you are having pain. AFTER USE Rest and breathe slowly and easily. It can be helpful to  keep track of a log of your progress. Your caregiver can provide you with a simple table to help with this. If you are using the spirometer at home, follow these instructions: [place] IF:  You are having difficultly using the spirometer. You have trouble using the spirometer as often as instructed. Your pain medication is not giving enough relief while using the spirometer. You develop fever of 100.5 F (38.1 C) or higher. SEEK IMMEDIATE MEDICAL CARE IF:  You cough up bloody sputum that had not been present before. You  develop fever of 102 F (38.9 C) or greater. You develop worsening pain at or near the incision site. MAKE SURE YOU:  Understand these instructions. Will watch your condition. Will get help right away if you are not doing well or get worse. Document Released: 06/13/2006 Document Revised: 04/25/2011 Document Reviewed: 08/14/2006 Exit[employer] Patient Information 2014 [employer], [place].   ________________________________________________________________________  WHAT IS A BLOOD TRANSFUSION? Blood Transfusion Information  A transfusion is the replacement of blood or some of its parts. Blood is made up of multiple cells which provide different functions. Red blood cells carry oxygen and are used for blood loss replacement. White blood cells fight against infection. Platelets control bleeding. Plasma helps clot blood. Other blood products are available for specialized needs, such as hemophilia or other clotting disorders. BEFORE THE TRANSFUSION  Who gives blood for transfusions?  Healthy volunteers who are fully evaluated to make sure their blood is safe. This is blood bank blood. Transfusion therapy is the safest it has ever been in the practice of medicine. Before blood is taken from a donor, a complete history is taken to make sure that person has no history of diseases nor engages in risky social behavior (examples are intravenous drug use or sexual activity with multiple partners). The donor's travel history is screened to minimize risk of transmitting infections, such as malaria. The donated blood is tested for signs of infectious diseases, such as HIV and hepatitis. The blood is then tested to be sure it is compatible with you in order to minimize the chance of a transfusion reaction. If you or a relative donates blood, this is often done in anticipation of surgery and is not appropriate for emergency situations. It takes many days to process the donated blood. RISKS AND COMPLICATIONS Although  transfusion therapy is very safe and saves many lives, the main dangers of transfusion include:  Getting an infectious disease. Developing a transfusion reaction. This is an allergic reaction to something in the blood you were given. Every precaution is taken to prevent this. The decision to have a blood transfusion has been considered carefully by your caregiver before blood is given. Blood is not given unless the benefits outweigh the risks. AFTER THE TRANSFUSION Right after receiving a blood transfusion, you will usually feel much better and more energetic. This is especially true if your red blood cells have gotten low (anemic). The transfusion raises the level of the red blood cells which carry oxygen, and this usually causes an energy increase. The nurse administering the transfusion will monitor you carefully for complications. HOME CARE INSTRUCTIONS  No special instructions are needed after a transfusion. You may find your energy is better. Speak with your caregiver about any limitations on activity for underlying diseases you may have. SEEK MEDICAL CARE IF:  Your condition is not improving after your transfusion. You develop redness or irritation at the intravenous (IV) site. SEEK IMMEDIATE MEDICAL CARE IF:  Any of the  following symptoms occur over the next 12 hours: Shaking chills. You have a temperature by mouth above 102 F (38.9 C), not controlled by medicine. Chest, back, or muscle pain. People around you feel you are not acting correctly or are confused. Shortness of breath or difficulty breathing. Dizziness and fainting. You get a rash or develop hives. You have a decrease in urine output. Your urine turns a dark color or changes to pink, red, or brown. Any of the following symptoms occur over the next 10 days: You have a temperature by mouth above 102 F (38.9 C), not controlled by medicine. Shortness of breath. Weakness after normal activity. The white part of the eye  turns yellow (jaundice). You have a decrease in the amount of urine or are urinating less often. Your urine turns a dark color or changes to pink, red, or brown. Document Released: 01/29/2000 Document Revised: 04/25/2011 Document Reviewed: 09/17/2007 [facility] Patient Information 2014 [place], [place].  _______________________________________________________________________

## 2021-03-25 NOTE — Progress Notes (Addendum)
COVID swab appointment: 03/31/21 @ 0815  COVID Vaccine Completed: yes x1 Date COVID Vaccine completed: 03/17/20 Has received booster: COVID vaccine manufacturer: [employer]   Date of COVID positive in last 90 days: no  PCP - Delman Cheadle, PA Cardiologist - Rozann Lesches, MD  Cardiac clearance by Sharrell Ku 02/18/21 in Epic Neuro clearance on chart  Chest x-ray - 01/06/21 CE EKG - 02/04/21 Epic Stress Test - n/a ECHO - 02/04/21 Epic Cardiac Cath - n/a Pacemaker/ICD device last checked: n/a Spinal Cord Stimulator: n/a  Bowel Prep - n/a  Sleep Study - n/a CPAP -   Fasting Blood Sugar - 140s Checks Blood Sugar every two weeks  Blood Thinner Instructions: Aspirin Instructions: ASA 81, will call PCP Last Dose:  Activity level: Can go up a flight of stairs and perform activities of daily living without stopping and without symptoms of chest pain or shortness of breath.       Anesthesia review: syncope in Nov 2022, HTN, DM 2  Patient denies shortness of breath, fever, cough and chest pain at PAT appointment   Patient verbalized understanding of instructions that were given to them at the PAT appointment. Patient was also instructed that they will need to review over the PAT instructions again at home before surgery.

## 2021-03-26 ENCOUNTER — Other Ambulatory Visit: Payer: Self-pay

## 2021-03-26 ENCOUNTER — Encounter: Payer: Self-pay | Admitting: Orthopaedic Surgery

## 2021-03-26 ENCOUNTER — Encounter (HOSPITAL_COMMUNITY): Payer: Self-pay

## 2021-03-26 ENCOUNTER — Encounter (HOSPITAL_COMMUNITY)
Admission: RE | Admit: 2021-03-26 | Discharge: 2021-03-26 | Disposition: A | Discharge status: Home / Self Care | Payer: BC Managed Care – PPO | Source: Ambulatory Visit | Attending: Orthopaedic Surgery | Admitting: Orthopaedic Surgery

## 2021-03-26 VITALS — BP 152/93 | HR 73 | Temp 98.4°F | Resp 16 | Ht 59.0 in | Wt 154.6 lb

## 2021-03-26 DIAGNOSIS — Z01812 Encounter for preprocedural laboratory examination: Secondary | ICD-10-CM | POA: Insufficient documentation

## 2021-03-26 DIAGNOSIS — K219 Gastro-esophageal reflux disease without esophagitis: Secondary | ICD-10-CM | POA: Diagnosis not present

## 2021-03-26 DIAGNOSIS — Z96651 Presence of right artificial knee joint: Secondary | ICD-10-CM | POA: Diagnosis not present

## 2021-03-26 DIAGNOSIS — E119 Type 2 diabetes mellitus without complications: Secondary | ICD-10-CM | POA: Diagnosis not present

## 2021-03-26 DIAGNOSIS — I2510 Atherosclerotic heart disease of native coronary artery without angina pectoris: Secondary | ICD-10-CM | POA: Insufficient documentation

## 2021-03-26 DIAGNOSIS — I119 Hypertensive heart disease without heart failure: Secondary | ICD-10-CM | POA: Insufficient documentation

## 2021-03-26 DIAGNOSIS — M1711 Unilateral primary osteoarthritis, right knee: Secondary | ICD-10-CM | POA: Diagnosis not present

## 2021-03-26 DIAGNOSIS — Z01818 Encounter for other preprocedural examination: Secondary | ICD-10-CM

## 2021-03-26 HISTORY — DX: Nausea with vomiting, unspecified: R11.2

## 2021-03-26 HISTORY — DX: Other specified postprocedural states: Z98.890

## 2021-03-26 LAB — CBC
HCT: 40.7 % (ref 36.0–46.0)
Hemoglobin: 13.3 g/dL (ref 12.0–15.0)
MCH: 30.9 pg (ref 26.0–34.0)
MCHC: 32.7 g/dL (ref 30.0–36.0)
MCV: 94.7 fL (ref 80.0–100.0)
Platelets: 316 10*3/uL (ref 150–400)
RBC: 4.3 MIL/uL (ref 3.87–5.11)
RDW: 14.2 % (ref 11.5–15.5)
WBC: 8 10*3/uL (ref 4.0–10.5)
nRBC: 0 % (ref 0.0–0.2)

## 2021-03-26 LAB — BASIC METABOLIC PANEL
Anion gap: 8 (ref 5–15)
BUN: 13 mg/dL (ref 6–20)
CO2: 26 mmol/L (ref 22–32)
Calcium: 9.3 mg/dL (ref 8.9–10.3)
Chloride: 104 mmol/L (ref 98–111)
Creatinine, Ser: 0.6 mg/dL (ref 0.44–1.00)
GFR, Estimated: >60 mL/min (ref >60)
Glucose, Bld: 122 mg/dL — ABNORMAL HIGH (ref 70–99)
Potassium: 4.6 mmol/L (ref 3.5–5.1)
Sodium: 138 mmol/L (ref 135–145)

## 2021-03-26 LAB — SURGICAL PCR SCREEN
MRSA, PCR: NEGATIVE
Staphylococcus aureus: POSITIVE — AB

## 2021-03-26 LAB — GLUCOSE, CAPILLARY: Glucose-Capillary: 121 mg/dL — ABNORMAL HIGH (ref 70–99)

## 2021-03-26 LAB — HEMOGLOBIN A1C
Hgb A1c MFr Bld: 6.5 % — ABNORMAL HIGH (ref 4.8–5.6)
Mean Plasma Glucose: 139.85 mg/dL

## 2021-03-29 NOTE — Progress Notes (Addendum)
Anesthesia Chart Review   Case: [id number] Date/Time: 04/02/21 1200   Procedures:      Right TOTAL KNEE ARTHROPLASTY (Right: Knee)     Right Knee HARDWARE REMOVAL (Right)   Anesthesia type: Choice   Pre-op diagnosis: Osteoarthritis / Retained Hardware Right Knee   Location: [facility] ROOM 09 / [facility]   Surgeons: Mcarthur Rossetti, MD       DISCUSSION:50 y.o. never smoker with h/o PONV, HTN, GERD, retained right knee hardware scheduled for above procedure 04/02/2021 with Dr. Jean Rosenthal.   Per cardiology preoperative evaluation 02/18/2021, "Per Dr. Domenic Polite, " Subsequent echocardiogram and cardiac monitor were overall reassuring from a cardiac perspective and do not necessarily point toward a cardiac etiology for her possible syncopal events.  There was concern about possible seizure evaluation and she was supposed to have formal neurology evaluation as well, I do not have this information.  Would not necessarily anticipate further cardiac work-up prior to any surgery at this point however."  Clearance from neurology on chart.   Anticipate pt can proceed with planned procedure barring acute status change.   VS: BP (!) 152/93    Pulse 73    Temp 36.9 C (Oral)    Resp 16    Ht 4\' 11"  (1.499 m)    Wt 70.1 kg    SpO2 100%    BMI 31.23 kg/m   PROVIDERS: Jake Samples, PA-C  Primary Cardiologist: Rozann Lesches, MD LABS: Labs reviewed: Acceptable for surgery. (all labs ordered are listed, but only abnormal results are displayed)  Labs Reviewed  SURGICAL PCR SCREEN - Abnormal; Notable for the following components:      Result Value   Staphylococcus aureus POSITIVE (*)    All other components within normal limits  HEMOGLOBIN A1C - Abnormal; Notable for the following components:   Hgb A1c MFr Bld 6.5 (*)    All other components within normal limits  BASIC METABOLIC PANEL - Abnormal; Notable for the following components:   Glucose, Bld 122 (*)    All other components within  normal limits  GLUCOSE, CAPILLARY - Abnormal; Notable for the following components:   Glucose-Capillary 121 (*)    All other components within normal limits  CBC  TYPE AND SCREEN     IMAGES: Brain MRI 02/10/2021 IMPRESSION: No change since 2016. No abnormality seen to explain seizure or syncope. Three punctate foci of T2 and FLAIR signal within the cerebral hemispheric white matter, often seen in normal individuals. With this pattern and without change since 2016, this is not likely to represent a manifestation of significant small-vessel change.  EKG: 02/04/21 Rate 78 bpm  NSR  CV: Echo 02/04/21 1. Left ventricular ejection fraction, by estimation, is 55 to 60%. The  left ventricle has normal function. The left ventricle has no regional  wall motion abnormalities. There is mild asymmetric left ventricular  hypertrophy of the basal segment. Left  ventricular diastolic parameters were normal.   2. Right ventricular systolic function is normal. The right ventricular  size is normal. The estimated right ventricular systolic pressure is 27.0  mmHg.   3. The mitral valve is grossly normal. Mild mitral valve regurgitation.   4. The aortic valve is tricuspid. Aortic valve regurgitation is not  visualized.   5. The inferior vena cava is normal in size with greater than 50%  respiratory variability, suggesting right atrial pressure of 3 mmHg.  Past Medical History:  Diagnosis Date   Arthritis    Essential hypertension  GERD (gastroesophageal reflux disease)    Mixed hyperlipidemia    PONV (postoperative nausea and vomiting)     Past Surgical History:  Procedure Laterality Date   ABDOMINAL HYSTERECTOMY     KNEE ARTHROSCOPY  2009   MASS EXCISION  02/11/2011   Procedure: EXCISION MASS;  Surgeon: Jessy Oto, MD;  Location: [facility];  Service: Orthopedics;  Laterality: Right;  resection of neuroma right infrapatellar medial knee   PATELLA ARTHROPLASTY      rt knee-   TOTAL KNEE ARTHROPLASTY  2011   partial-[place] reg    TUBAL LIGATION      MEDICATIONS:  aspirin 81 MG EC tablet   atorvastatin (LIPITOR) 10 MG tablet   estradiol (CLIMARA - DOSED IN MG/24 HR) 0.1 mg/24hr patch   ibuprofen (ADVIL) 200 MG tablet   losartan (COZAAR) 100 MG tablet   metFORMIN (GLUCOPHAGE) 1000 MG tablet   pantoprazole (PROTONIX) 40 MG tablet   traMADol (ULTRAM) 50 MG tablet   No current facility-administered medications for this encounter.   Carrie Felix Ward, PA-C [facility] Pre-Surgical Testing [phone]

## 2021-03-29 NOTE — Anesthesia Preprocedure Evaluation (Addendum)
Anesthesia Evaluation  Patient identified by MRN, date of birth, ID band Patient awake    Reviewed: Allergy & Precautions, NPO status , Patient's Chart, lab work & pertinent test results  History of Anesthesia Complications (+) PONV and history of anesthetic complications  Airway Mallampati: I  TM Distance: >3 FB Neck ROM: Full    Dental no notable dental hx. (+) Teeth Intact, Dental Advisory Given   Pulmonary neg pulmonary ROS,    Pulmonary exam normal breath sounds clear to auscultation       Cardiovascular hypertension (154/84 in preop), Pt. on medications Normal cardiovascular exam Rhythm:Regular Rate:Normal  Evaluated by cards and neuro by Jan 2023 for syncope- neg evaluations all around  No syncope since then    Neuro/Psych negative neurological ROS  negative psych ROS   GI/Hepatic Neg liver ROS, GERD  Medicated and Controlled,  Endo/Other  diabetes, Well Controlled, Type 2, Oral Hypoglycemic Agentsa1c 6.5  Renal/GU negative Renal ROS  negative genitourinary   Musculoskeletal  (+) Arthritis , Osteoarthritis,    Abdominal   Peds  Hematology negative hematology ROS (+) hct 40.7, plt 316   Anesthesia Other Findings   Reproductive/Obstetrics negative OB ROS                           Anesthesia Physical Anesthesia Plan  ASA: 2  Anesthesia Plan: Spinal, Regional and MAC   Post-op Pain Management: Tylenol PO (pre-op)   Induction:   PONV Risk Score and Plan: 2 and Propofol infusion and TIVA  Airway Management Planned: Natural Airway and Nasal Cannula  Additional Equipment: None  Intra-op Plan:   Post-operative Plan:   Informed Consent: I have reviewed the patients History and Physical, chart, labs and discussed the procedure including the risks, benefits and alternatives for the proposed anesthesia with the patient or authorized representative who has indicated his/her  understanding and acceptance.     Dental advisory given  Plan Discussed with: CRNA  Anesthesia Plan Comments: (See PAT note 03/26/2021, Konrad Felix Ward, PA-C)      Anesthesia Quick Evaluation

## 2021-03-31 ENCOUNTER — Encounter (HOSPITAL_COMMUNITY)
Admission: RE | Admit: 2021-03-31 | Discharge: 2021-03-31 | Disposition: A | Discharge status: Home / Self Care | Payer: BC Managed Care – PPO | Source: Ambulatory Visit | Attending: Orthopaedic Surgery | Admitting: Orthopaedic Surgery

## 2021-03-31 ENCOUNTER — Other Ambulatory Visit: Payer: Self-pay

## 2021-03-31 DIAGNOSIS — Z7901 Long term (current) use of anticoagulants: Secondary | ICD-10-CM | POA: Diagnosis not present

## 2021-03-31 DIAGNOSIS — M1711 Unilateral primary osteoarthritis, right knee: Secondary | ICD-10-CM | POA: Diagnosis not present

## 2021-03-31 DIAGNOSIS — Z7982 Long term (current) use of aspirin: Secondary | ICD-10-CM | POA: Diagnosis not present

## 2021-03-31 DIAGNOSIS — I10 Essential (primary) hypertension: Secondary | ICD-10-CM | POA: Diagnosis not present

## 2021-03-31 DIAGNOSIS — Z7984 Long term (current) use of oral hypoglycemic drugs: Secondary | ICD-10-CM | POA: Diagnosis not present

## 2021-03-31 DIAGNOSIS — Z20822 Contact with and (suspected) exposure to covid-19: Secondary | ICD-10-CM | POA: Diagnosis not present

## 2021-03-31 LAB — SARS CORONAVIRUS 2 (TAT 6-24 HRS): SARS Coronavirus 2: NEGATIVE

## 2021-04-01 NOTE — H&P (Signed)
TOTAL KNEE ADMISSION H&P  Patient is being admitted for right total knee arthroplasty.  Subjective:  Chief Complaint:right knee pain.  HPI: Carrie [name], 51 y.o. female, has a history of pain and functional disability in the right knee due to arthritis and has failed non-surgical conservative treatments for greater than 12 weeks to includeNSAID's and/or analgesics, corticosteriod injections, and activity modification.  Onset of symptoms was gradual, starting 3 years ago with gradually worsening course since that time. The patient noted prior procedures on the knee to include  unicompartmental arthroplasty on the right knee(s).  Patient currently rates pain in the right knee(s) at 10 out of 10 with activity. Patient has night pain, worsening of pain with activity and weight bearing, pain that interferes with activities of daily living, pain with passive range of motion, and joint swelling.  Patient has evidence of joint space narrowing and areas of full-thicknes cartilage loss on MRI  by imaging studies. This patient has had failure of unicompartmental arthroplasty. There is no active infection.  Patient Active Problem List   Diagnosis Date Noted   Allergic rhinitis due to pollen 28/78/6767   Eosinophilic esophagitis 20/94/7096   Rash and other nonspecific skin eruption 06/22/2020   Unilateral primary osteoarthritis, right knee 10/09/2017   AC joint arthropathy 01/04/2017   Acute pain of right shoulder 11/15/2016   Other derangements of patella, unspecified knee 09/02/2015   Recurrent dislocation of right patella 09/02/2015   Breast hypertrophy in female 05/13/2015   Neuroma of lower extremity 02/11/2011   Past Medical History:  Diagnosis Date   Arthritis    Essential hypertension    GERD (gastroesophageal reflux disease)    Mixed hyperlipidemia    PONV (postoperative nausea and vomiting)     Past Surgical History:  Procedure Laterality Date   ABDOMINAL HYSTERECTOMY     KNEE  ARTHROSCOPY  2009   MASS EXCISION  02/11/2011   Procedure: EXCISION MASS;  Surgeon: Jessy Oto, MD;  Location: [facility];  Service: Orthopedics;  Laterality: Right;  resection of neuroma right infrapatellar medial knee   PATELLA ARTHROPLASTY     rt knee-   TOTAL KNEE ARTHROPLASTY  2011   partial-[place] [facility]    TUBAL LIGATION      No current facility-administered medications for this encounter.   Current Outpatient Medications  Medication Sig Dispense Refill Last Dose   aspirin 81 MG EC tablet Take 81 mg by mouth daily.      atorvastatin (LIPITOR) 10 MG tablet Take 10 mg by mouth at bedtime.      estradiol (CLIMARA - DOSED IN MG/24 HR) 0.1 mg/24hr patch Place 0.1 mg onto the skin every Sunday.      ibuprofen (ADVIL) 200 MG tablet Take 800 mg by mouth every 6 (six) hours as needed for moderate pain or headache.      losartan (COZAAR) 100 MG tablet Take 100 mg by mouth daily.      metFORMIN (GLUCOPHAGE) 1000 MG tablet Take 1,000 mg by mouth 2 (two) times daily.      pantoprazole (PROTONIX) 40 MG tablet Take 40 mg by mouth daily.      traMADol (ULTRAM) 50 MG tablet Take 1 tablet (50 mg total) by mouth every 6 (six) hours as needed. 30 tablet 0    Allergies  Allergen Reactions   Codeine Other (See Comments)    Severe headaches     Social History   Tobacco Use   Smoking status: Never  Smokeless tobacco: Never  Substance Use Topics   Alcohol use: No    Alcohol/week: 0.0 standard drinks    Family History  Problem Relation Age of Onset   Breast cancer Mother    Heart disease Father    Prostate cancer Father      Review of Systems  Musculoskeletal:  Positive for gait problem and joint swelling.  All other systems reviewed and are negative.  Objective:  Physical Exam Vitals reviewed.  Constitutional:      Appearance: Normal appearance.  HENT:     Head: Normocephalic and atraumatic.  Eyes:     Extraocular Movements: Extraocular movements intact.      Pupils: Pupils are equal, round, and reactive to light.  Cardiovascular:     Rate and Rhythm: Normal rate and regular rhythm.     Pulses: Normal pulses.  Pulmonary:     Effort: Pulmonary effort is normal.     Breath sounds: Normal breath sounds.  Abdominal:     Palpations: Abdomen is soft.  Musculoskeletal:     Cervical back: Normal range of motion and neck supple.     Right knee: Effusion and bony tenderness present. Tenderness present over the medial joint line, lateral joint line and patellar tendon.  Neurological:     Mental Status: She is alert and oriented to person, place, and time.  Psychiatric:        Behavior: Behavior normal.    Vital signs in last 24 hours:    Labs:   Estimated body mass index is 31.23 kg/m as calculated from the following:   Height as of 03/26/21: 4\' 11"  (1.499 m).   Weight as of 03/26/21: 70.1 kg.   Imaging Review Plain radiographs demonstrate severe degenerative joint disease of the right knee(s). The overall alignment isneutral. The bone quality appears to be excellent for age and reported activity level.      Assessment/Plan:  End stage arthritis, right knee   The patient history, physical examination, clinical judgment of the provider and imaging studies are consistent with end stage degenerative joint disease of the right knee(s) and total knee arthroplasty is deemed medically necessary. The treatment options including medical management, injection therapy arthroscopy and arthroplasty were discussed at length. The risks and benefits of total knee arthroplasty were presented and reviewed. The risks due to aseptic loosening, infection, stiffness, patella tracking problems, thromboembolic complications and other imponderables were discussed. The patient acknowledged the explanation, agreed to proceed with the plan and consent was signed. Patient is being admitted for inpatient treatment for surgery, pain control, PT, OT, prophylactic  antibiotics, VTE prophylaxis, progressive ambulation and ADL's and discharge planning. The patient is planning to be discharged home with home health services

## 2021-04-02 ENCOUNTER — Observation Stay (HOSPITAL_COMMUNITY): Payer: BC Managed Care – PPO

## 2021-04-02 ENCOUNTER — Encounter (HOSPITAL_COMMUNITY)
Admission: RE | Disposition: A | Discharge status: Home Health | Payer: Self-pay | Source: Ambulatory Visit | Attending: Orthopaedic Surgery

## 2021-04-02 ENCOUNTER — Ambulatory Visit (HOSPITAL_COMMUNITY): Payer: BC Managed Care – PPO | Admitting: Anesthesiology

## 2021-04-02 ENCOUNTER — Other Ambulatory Visit: Payer: Self-pay

## 2021-04-02 ENCOUNTER — Ambulatory Visit (HOSPITAL_COMMUNITY): Payer: BC Managed Care – PPO | Admitting: Physician Assistant

## 2021-04-02 ENCOUNTER — Encounter (HOSPITAL_COMMUNITY): Payer: Self-pay | Admitting: Orthopaedic Surgery

## 2021-04-02 ENCOUNTER — Observation Stay (HOSPITAL_COMMUNITY)
Admission: RE | Admit: 2021-04-02 | Discharge: 2021-04-05 | Disposition: A | Discharge status: Home Health | Payer: BC Managed Care – PPO | Source: Ambulatory Visit | Attending: Orthopaedic Surgery | Admitting: Orthopaedic Surgery

## 2021-04-02 DIAGNOSIS — Z01818 Encounter for other preprocedural examination: Secondary | ICD-10-CM

## 2021-04-02 DIAGNOSIS — Z7982 Long term (current) use of aspirin: Secondary | ICD-10-CM | POA: Insufficient documentation

## 2021-04-02 DIAGNOSIS — G8918 Other acute postprocedural pain: Secondary | ICD-10-CM | POA: Diagnosis not present

## 2021-04-02 DIAGNOSIS — I10 Essential (primary) hypertension: Secondary | ICD-10-CM | POA: Diagnosis not present

## 2021-04-02 DIAGNOSIS — M1711 Unilateral primary osteoarthritis, right knee: Principal | ICD-10-CM | POA: Diagnosis present

## 2021-04-02 DIAGNOSIS — Z7901 Long term (current) use of anticoagulants: Secondary | ICD-10-CM | POA: Diagnosis not present

## 2021-04-02 DIAGNOSIS — Z20822 Contact with and (suspected) exposure to covid-19: Secondary | ICD-10-CM | POA: Insufficient documentation

## 2021-04-02 DIAGNOSIS — Z7984 Long term (current) use of oral hypoglycemic drugs: Secondary | ICD-10-CM | POA: Diagnosis not present

## 2021-04-02 DIAGNOSIS — Z96651 Presence of right artificial knee joint: Secondary | ICD-10-CM

## 2021-04-02 HISTORY — PX: HARDWARE REMOVAL: SHX979

## 2021-04-02 HISTORY — PX: TOTAL KNEE ARTHROPLASTY: SHX125

## 2021-04-02 LAB — GLUCOSE, CAPILLARY
Glucose-Capillary: 100 mg/dL — ABNORMAL HIGH (ref 70–99)
Glucose-Capillary: 172 mg/dL — ABNORMAL HIGH (ref 70–99)
Glucose-Capillary: 213 mg/dL — ABNORMAL HIGH (ref 70–99)

## 2021-04-02 LAB — ABO/RH: ABO/RH(D): O POS

## 2021-04-02 LAB — TYPE AND SCREEN
ABO/RH(D): O POS
Antibody Screen: NEGATIVE

## 2021-04-02 SURGERY — ARTHROPLASTY, KNEE, TOTAL
Anesthesia: Monitor Anesthesia Care | Site: Knee | Laterality: Right

## 2021-04-02 MED ORDER — PROPOFOL 500 MG/50ML IV EMUL
INTRAVENOUS | Status: AC
  Filled 2021-04-02: qty 50

## 2021-04-02 MED ORDER — ACETAMINOPHEN 325 MG PO TABS: 325.0000 mg | ORAL_TABLET | Freq: Four times a day (QID) | ORAL | Status: DC | PRN

## 2021-04-02 MED ORDER — PHENOL 1.4 % MT LIQD: 1.0000 | OROMUCOSAL | Status: DC | PRN

## 2021-04-02 MED ORDER — HYDROMORPHONE HCL 1 MG/ML IJ SOLN: 0.2500 mg | INTRAMUSCULAR | Status: DC | PRN

## 2021-04-02 MED ORDER — MENTHOL 3 MG MT LOZG: 1.0000 | LOZENGE | OROMUCOSAL | Status: DC | PRN

## 2021-04-02 MED ORDER — OXYCODONE HCL 5 MG PO TABS
10.0000 mg | ORAL_TABLET | ORAL | Status: DC | PRN
  Administered 2021-04-03 – 2021-04-05 (×5): 10 mg via ORAL
  Filled 2021-04-02 (×2): qty 2

## 2021-04-02 MED ORDER — METHOCARBAMOL 500 MG IVPB - SIMPLE MED
500.0000 mg | Freq: Four times a day (QID) | INTRAVENOUS | Status: DC | PRN
  Filled 2021-04-02: qty 50

## 2021-04-02 MED ORDER — BUPIVACAINE IN DEXTROSE 0.75-8.25 % IT SOLN
INTRATHECAL | Status: DC | PRN
  Administered 2021-04-02: 1.8 mL via INTRATHECAL

## 2021-04-02 MED ORDER — DEXAMETHASONE SODIUM PHOSPHATE 10 MG/ML IJ SOLN
INTRAMUSCULAR | Status: DC | PRN
  Administered 2021-04-02: 4 mg via INTRAVENOUS

## 2021-04-02 MED ORDER — FENTANYL CITRATE PF 50 MCG/ML IJ SOSY
50.0000 ug | PREFILLED_SYRINGE | INTRAMUSCULAR | Status: DC
  Administered 2021-04-02: 100 ug via INTRAVENOUS
  Filled 2021-04-02: qty 2

## 2021-04-02 MED ORDER — PROPOFOL 10 MG/ML IV BOLUS
INTRAVENOUS | Status: DC | PRN
Start: 2021-04-02 — End: 2021-04-02
  Administered 2021-04-02 (×2): 20 mg via INTRAVENOUS

## 2021-04-02 MED ORDER — LOSARTAN POTASSIUM 50 MG PO TABS
100.0000 mg | ORAL_TABLET | Freq: Every day | ORAL | Status: DC
  Administered 2021-04-02 – 2021-04-05 (×4): 100 mg via ORAL
  Filled 2021-04-02 (×4): qty 2

## 2021-04-02 MED ORDER — METOCLOPRAMIDE HCL 5 MG/ML IJ SOLN: 5.0000 mg | Freq: Three times a day (TID) | INTRAMUSCULAR | Status: DC | PRN

## 2021-04-02 MED ORDER — METOCLOPRAMIDE HCL 5 MG PO TABS: 5.0000 mg | ORAL_TABLET | Freq: Three times a day (TID) | ORAL | Status: DC | PRN

## 2021-04-02 MED ORDER — ONDANSETRON HCL 4 MG/2ML IJ SOLN
INTRAMUSCULAR | Status: AC
  Filled 2021-04-02: qty 2

## 2021-04-02 MED ORDER — PROPOFOL 500 MG/50ML IV EMUL
INTRAVENOUS | Status: DC | PRN
  Administered 2021-04-02: 75 ug/kg/min via INTRAVENOUS

## 2021-04-02 MED ORDER — ORAL CARE MOUTH RINSE: 15.0000 mL | Freq: Once | OROMUCOSAL | Status: AC

## 2021-04-02 MED ORDER — DEXAMETHASONE SODIUM PHOSPHATE 10 MG/ML IJ SOLN
INTRAMUSCULAR | Status: AC
  Filled 2021-04-02: qty 1

## 2021-04-02 MED ORDER — OXYCODONE HCL 5 MG PO TABS
5.0000 mg | ORAL_TABLET | ORAL | Status: DC | PRN
  Administered 2021-04-02 – 2021-04-05 (×5): 10 mg via ORAL
  Filled 2021-04-02 (×8): qty 2

## 2021-04-02 MED ORDER — CEFAZOLIN SODIUM-DEXTROSE 2-4 GM/100ML-% IV SOLN
2.0000 g | INTRAVENOUS | Status: AC
  Administered 2021-04-02: 2 g via INTRAVENOUS
  Filled 2021-04-02: qty 100

## 2021-04-02 MED ORDER — ALUM & MAG HYDROXIDE-SIMETH 200-200-20 MG/5ML PO SUSP: 30.0000 mL | ORAL | Status: DC | PRN

## 2021-04-02 MED ORDER — METFORMIN HCL 500 MG PO TABS
1000.0000 mg | ORAL_TABLET | Freq: Two times a day (BID) | ORAL | Status: DC
  Administered 2021-04-03 – 2021-04-04 (×4): 1000 mg via ORAL
  Filled 2021-04-02 (×5): qty 2

## 2021-04-02 MED ORDER — ASPIRIN 81 MG PO CHEW
81.0000 mg | CHEWABLE_TABLET | Freq: Two times a day (BID) | ORAL | Status: DC
  Administered 2021-04-02 – 2021-04-05 (×6): 81 mg via ORAL
  Filled 2021-04-02 (×6): qty 1

## 2021-04-02 MED ORDER — FENTANYL CITRATE (PF) 100 MCG/2ML IJ SOLN
INTRAMUSCULAR | Status: DC | PRN
Start: 2021-04-02 — End: 2021-04-02
  Administered 2021-04-02 (×2): 25 ug via INTRAVENOUS

## 2021-04-02 MED ORDER — SODIUM CHLORIDE 0.9 % IV SOLN: INTRAVENOUS | Status: DC

## 2021-04-02 MED ORDER — ATORVASTATIN CALCIUM 10 MG PO TABS
10.0000 mg | ORAL_TABLET | Freq: Every day | ORAL | Status: DC
  Administered 2021-04-02 – 2021-04-04 (×3): 10 mg via ORAL
  Filled 2021-04-02 (×3): qty 1

## 2021-04-02 MED ORDER — FENTANYL CITRATE (PF) 100 MCG/2ML IJ SOLN
INTRAMUSCULAR | Status: AC
  Filled 2021-04-02: qty 2

## 2021-04-02 MED ORDER — DEXMEDETOMIDINE (PRECEDEX) IN NS 20 MCG/5ML (4 MCG/ML) IV SYRINGE
PREFILLED_SYRINGE | INTRAVENOUS | Status: AC
  Filled 2021-04-02: qty 5

## 2021-04-02 MED ORDER — ONDANSETRON HCL 4 MG/2ML IJ SOLN
4.0000 mg | Freq: Four times a day (QID) | INTRAMUSCULAR | Status: DC | PRN
  Administered 2021-04-03 (×2): 4 mg via INTRAVENOUS
  Filled 2021-04-02 (×2): qty 2

## 2021-04-02 MED ORDER — CHLORHEXIDINE GLUCONATE 0.12 % MT SOLN
15.0000 mL | Freq: Once | OROMUCOSAL | Status: AC
  Administered 2021-04-02: 15 mL via OROMUCOSAL

## 2021-04-02 MED ORDER — MIDAZOLAM HCL 2 MG/2ML IJ SOLN
INTRAMUSCULAR | Status: DC | PRN
  Administered 2021-04-02 (×2): 1 mg via INTRAVENOUS

## 2021-04-02 MED ORDER — MIDAZOLAM HCL 2 MG/2ML IJ SOLN
INTRAMUSCULAR | Status: AC
  Filled 2021-04-02: qty 2

## 2021-04-02 MED ORDER — OXYCODONE HCL 5 MG PO TABS: 5.0000 mg | ORAL_TABLET | Freq: Once | ORAL | Status: DC | PRN

## 2021-04-02 MED ORDER — MIDAZOLAM HCL 2 MG/2ML IJ SOLN
1.0000 mg | INTRAMUSCULAR | Status: DC
  Administered 2021-04-02: 2 mg via INTRAVENOUS
  Filled 2021-04-02: qty 2

## 2021-04-02 MED ORDER — LACTATED RINGERS IV SOLN: INTRAVENOUS | Status: DC

## 2021-04-02 MED ORDER — OXYCODONE HCL 5 MG/5ML PO SOLN: 5.0000 mg | Freq: Once | ORAL | Status: DC | PRN

## 2021-04-02 MED ORDER — DOCUSATE SODIUM 100 MG PO CAPS
100.0000 mg | ORAL_CAPSULE | Freq: Two times a day (BID) | ORAL | Status: DC
  Administered 2021-04-02 – 2021-04-05 (×6): 100 mg via ORAL
  Filled 2021-04-02 (×6): qty 1

## 2021-04-02 MED ORDER — PHENYLEPHRINE 40 MCG/ML (10ML) SYRINGE FOR IV PUSH (FOR BLOOD PRESSURE SUPPORT)
PREFILLED_SYRINGE | INTRAVENOUS | Status: AC
  Filled 2021-04-02: qty 10

## 2021-04-02 MED ORDER — BUPIVACAINE LIPOSOME 1.3 % IJ SUSP
INTRAMUSCULAR | Status: DC | PRN
  Administered 2021-04-02: 20 mL

## 2021-04-02 MED ORDER — LIDOCAINE 2% (20 MG/ML) 5 ML SYRINGE
INTRAMUSCULAR | Status: DC | PRN
  Administered 2021-04-02: 40 mg via INTRAVENOUS

## 2021-04-02 MED ORDER — PANTOPRAZOLE SODIUM 40 MG PO TBEC
40.0000 mg | DELAYED_RELEASE_TABLET | Freq: Every day | ORAL | Status: DC
  Administered 2021-04-02 – 2021-04-05 (×4): 40 mg via ORAL
  Filled 2021-04-02 (×4): qty 1

## 2021-04-02 MED ORDER — HYDROMORPHONE HCL 1 MG/ML IJ SOLN
0.5000 mg | INTRAMUSCULAR | Status: DC | PRN
  Administered 2021-04-03 (×2): 1 mg via INTRAVENOUS
  Filled 2021-04-02 (×2): qty 1

## 2021-04-02 MED ORDER — LIDOCAINE HCL (PF) 2 % IJ SOLN
INTRAMUSCULAR | Status: AC
  Filled 2021-04-02: qty 5

## 2021-04-02 MED ORDER — TRANEXAMIC ACID-NACL 1000-0.7 MG/100ML-% IV SOLN
INTRAVENOUS | Status: DC | PRN
  Administered 2021-04-02: 1000 mg via INTRAVENOUS

## 2021-04-02 MED ORDER — TRANEXAMIC ACID-NACL 1000-0.7 MG/100ML-% IV SOLN
INTRAVENOUS | Status: AC
  Filled 2021-04-02: qty 100

## 2021-04-02 MED ORDER — BUPIVACAINE HCL (PF) 0.5 % IJ SOLN
INTRAMUSCULAR | Status: DC | PRN
  Administered 2021-04-02: 30 mL via PERINEURAL

## 2021-04-02 MED ORDER — DEXAMETHASONE SODIUM PHOSPHATE 10 MG/ML IJ SOLN
INTRAMUSCULAR | Status: DC | PRN
  Administered 2021-04-02: 10 mg

## 2021-04-02 MED ORDER — SODIUM CHLORIDE (PF) 0.9 % IJ SOLN
INTRAMUSCULAR | Status: AC
  Filled 2021-04-02: qty 20

## 2021-04-02 MED ORDER — ONDANSETRON HCL 4 MG PO TABS: 4.0000 mg | ORAL_TABLET | Freq: Four times a day (QID) | ORAL | Status: DC | PRN

## 2021-04-02 MED ORDER — POVIDONE-IODINE 10 % EX SWAB
2.0000 "application " | Freq: Once | CUTANEOUS | Status: AC
  Administered 2021-04-02: 2 via TOPICAL

## 2021-04-02 MED ORDER — 0.9 % SODIUM CHLORIDE (POUR BTL) OPTIME
TOPICAL | Status: DC | PRN
  Administered 2021-04-02: 1000 mL

## 2021-04-02 MED ORDER — SODIUM CHLORIDE 0.9 % IR SOLN
Status: DC | PRN
  Administered 2021-04-02: 1000 mL

## 2021-04-02 MED ORDER — STERILE WATER FOR IRRIGATION IR SOLN
Status: DC | PRN
  Administered 2021-04-02: 2000 mL

## 2021-04-02 MED ORDER — DIPHENHYDRAMINE HCL 12.5 MG/5ML PO ELIX: 12.5000 mg | ORAL_SOLUTION | ORAL | Status: DC | PRN

## 2021-04-02 MED ORDER — PROPOFOL 1000 MG/100ML IV EMUL
INTRAVENOUS | Status: AC
  Filled 2021-04-02: qty 100

## 2021-04-02 MED ORDER — ONDANSETRON HCL 4 MG/2ML IJ SOLN
INTRAMUSCULAR | Status: DC | PRN
  Administered 2021-04-02: 4 mg via INTRAVENOUS

## 2021-04-02 MED ORDER — CEFAZOLIN SODIUM-DEXTROSE 1-4 GM/50ML-% IV SOLN
1.0000 g | Freq: Four times a day (QID) | INTRAVENOUS | Status: AC
  Administered 2021-04-02 – 2021-04-03 (×2): 1 g via INTRAVENOUS
  Filled 2021-04-02 (×2): qty 50

## 2021-04-02 MED ORDER — ACETAMINOPHEN 500 MG PO TABS
1000.0000 mg | ORAL_TABLET | Freq: Once | ORAL | Status: AC
  Administered 2021-04-02: 1000 mg via ORAL
  Filled 2021-04-02: qty 2

## 2021-04-02 MED ORDER — SODIUM CHLORIDE (PF) 0.9 % IJ SOLN
INTRAMUSCULAR | Status: DC | PRN
  Administered 2021-04-02: 20 mL

## 2021-04-02 MED ORDER — LACTATED RINGERS IV SOLN: INTRAVENOUS | Status: DC | PRN

## 2021-04-02 MED ORDER — METHOCARBAMOL 500 MG PO TABS
500.0000 mg | ORAL_TABLET | Freq: Four times a day (QID) | ORAL | Status: DC | PRN
  Administered 2021-04-03 – 2021-04-05 (×3): 500 mg via ORAL
  Filled 2021-04-02 (×3): qty 1

## 2021-04-02 MED ORDER — ONDANSETRON HCL 4 MG/2ML IJ SOLN: 4.0000 mg | Freq: Once | INTRAMUSCULAR | Status: DC | PRN

## 2021-04-02 SURGICAL SUPPLY — 82 items
APL SKNCLS STERI-STRIP NONHPOA (GAUZE/BANDAGES/DRESSINGS)
BAG COUNTER SPONGE SURGICOUNT (BAG) IMPLANT
BAG SPEC THK2 15X12 ZIP CLS (MISCELLANEOUS) ×2
BAG SPNG CNTER NS LX DISP (BAG)
BAG ZIPLOCK 12X15 (MISCELLANEOUS) ×4 IMPLANT
BANDAGE ESMARK 6X9 LF (GAUZE/BANDAGES/DRESSINGS) ×3 IMPLANT
BENZOIN TINCTURE PRP APPL 2/3 (GAUZE/BANDAGES/DRESSINGS) IMPLANT
BLADE SAG 18X100X1.27 (BLADE) ×4 IMPLANT
BLADE SURG SZ10 CARB STEEL (BLADE) ×8 IMPLANT
BNDG CMPR 9X6 STRL LF SNTH (GAUZE/BANDAGES/DRESSINGS) ×2
BNDG ELASTIC 6X5.8 VLCR STR LF (GAUZE/BANDAGES/DRESSINGS) ×7 IMPLANT
BNDG ESMARK 6X9 LF (GAUZE/BANDAGES/DRESSINGS) ×3
BOWL SMART MIX CTS (DISPOSABLE) ×1 IMPLANT
BSPLAT TIB 5D C 16NT STM RT (Knees) ×2 IMPLANT
CEMENT BONE R 1X40 (Cement) ×2 IMPLANT
COMP FEM CMT CR PS 56 RT (Joint) ×3 IMPLANT
COMPONENT FEM CMT CR PS 56 RT (Joint) IMPLANT
COOLER ICEMAN CLASSIC (MISCELLANEOUS) ×4 IMPLANT
COVER SURGICAL LIGHT HANDLE (MISCELLANEOUS) ×4 IMPLANT
CUFF TOURN SGL QUICK 34 (TOURNIQUET CUFF) ×3
CUFF TRNQT CYL 34X4.125X (TOURNIQUET CUFF) ×3 IMPLANT
DRAPE INCISE IOBAN 66X45 STRL (DRAPES) ×4 IMPLANT
DRAPE POUCH INSTRU U-SHP 10X18 (DRAPES) ×4 IMPLANT
DRAPE STERI IOBAN 125X83 (DRAPES) ×4 IMPLANT
DRAPE U-SHAPE 47X51 STRL (DRAPES) ×4 IMPLANT
DRSG PAD ABDOMINAL 8X10 ST (GAUZE/BANDAGES/DRESSINGS) ×8 IMPLANT
DURAPREP 26ML APPLICATOR (WOUND CARE) ×4 IMPLANT
ELECT BLADE TIP CTD 4 INCH (ELECTRODE) ×4 IMPLANT
ELECT REM PT RETURN 15FT ADLT (MISCELLANEOUS) ×4 IMPLANT
GAUZE SPONGE 4X4 12PLY STRL (GAUZE/BANDAGES/DRESSINGS) ×4 IMPLANT
GAUZE XEROFORM 1X8 LF (GAUZE/BANDAGES/DRESSINGS) ×1 IMPLANT
GLOVE SRG 8 PF TXTR STRL LF DI (GLOVE) ×6 IMPLANT
GLOVE SURG ENC MOIS LTX SZ7 (GLOVE) ×1 IMPLANT
GLOVE SURG ENC MOIS LTX SZ7.5 (GLOVE) ×4 IMPLANT
GLOVE SURG NEOPR MICRO LF SZ8 (GLOVE) ×4 IMPLANT
GLOVE SURG UNDER POLY LF SZ6.5 (GLOVE) ×2 IMPLANT
GLOVE SURG UNDER POLY LF SZ7 (GLOVE) ×1 IMPLANT
GLOVE SURG UNDER POLY LF SZ8 (GLOVE) ×6
GOWN SRG 2XL LVL 4 BRTHBL STRL (GOWNS) IMPLANT
GOWN STRL NON-REIN 2XL LVL4 (GOWNS) ×3
GOWN STRL REUS W/TWL LRG LVL3 (GOWN DISPOSABLE) ×1 IMPLANT
GOWN STRL REUS W/TWL XL LVL3 (GOWN DISPOSABLE) ×8 IMPLANT
HANDPIECE INTERPULSE COAX TIP (DISPOSABLE) ×3
HDLS TROCR DRIL PIN KNEE 75 (PIN) ×12
HOLDER FOLEY CATH W/STRAP (MISCELLANEOUS) ×1 IMPLANT
IMMOBILIZER KNEE 20 (SOFTGOODS) ×3
IMMOBILIZER KNEE 20 THIGH 36 (SOFTGOODS) ×3 IMPLANT
INSERT TIB AS PERS SZ CD 12 RT (Insert) ×1 IMPLANT
IV NS 1000ML (IV SOLUTION) ×3
IV NS 1000ML BAXH (IV SOLUTION) IMPLANT
KIT BASIN OR (CUSTOM PROCEDURE TRAY) ×4 IMPLANT
KIT TURNOVER KIT A (KITS) ×1 IMPLANT
NS IRRIG 1000ML POUR BTL (IV SOLUTION) ×4 IMPLANT
PACK ORTHO EXTREMITY (CUSTOM PROCEDURE TRAY) ×3 IMPLANT
PACK TOTAL KNEE CUSTOM (KITS) ×4 IMPLANT
PAD COLD SHLDR WRAP-ON (PAD) ×4 IMPLANT
PADDING CAST ABS 6INX4YD NS (CAST SUPPLIES) ×1
PADDING CAST ABS COTTON 6X4 NS (CAST SUPPLIES) IMPLANT
PADDING CAST COTTON 6X4 STRL (CAST SUPPLIES) ×8 IMPLANT
PIN DRILL HDLS TROCAR 75 4PK (PIN) IMPLANT
PROTECTOR NERVE ULNAR (MISCELLANEOUS) ×4 IMPLANT
SCREW FEMALE HEX FIX 25X2.5 (ORTHOPEDIC DISPOSABLE SUPPLIES) ×2 IMPLANT
SET HNDPC FAN SPRY TIP SCT (DISPOSABLE) ×3 IMPLANT
SET PAD KNEE POSITIONER (MISCELLANEOUS) ×4 IMPLANT
SPIKE FLUID TRANSFER (MISCELLANEOUS) IMPLANT
SPONGE T-LAP 18X18 ~~LOC~~+RFID (SPONGE) ×12 IMPLANT
STAPLER VISISTAT 35W (STAPLE) ×1 IMPLANT
STEM TIBIA 5 DEG SZ C R KNEE (Knees) IMPLANT
STRIP CLOSURE SKIN 1/2X4 (GAUZE/BANDAGES/DRESSINGS) IMPLANT
SUT MNCRL AB 4-0 PS2 18 (SUTURE) IMPLANT
SUT VIC AB 0 CT1 27 (SUTURE) ×3
SUT VIC AB 0 CT1 27XBRD ANTBC (SUTURE) ×3 IMPLANT
SUT VIC AB 1 CT1 27 (SUTURE) ×6
SUT VIC AB 1 CT1 27XBRD ANTBC (SUTURE) ×3 IMPLANT
SUT VIC AB 1 CT1 36 (SUTURE) ×8 IMPLANT
SUT VIC AB 2-0 CT1 27 (SUTURE) ×6
SUT VIC AB 2-0 CT1 TAPERPNT 27 (SUTURE) ×6 IMPLANT
TIBIA STEM 5 DEG SZ C R KNEE (Knees) ×3 IMPLANT
TOWEL OR 17X26 10 PK STRL BLUE (TOWEL DISPOSABLE) ×8 IMPLANT
TRAY FOLEY MTR SLVR 14FR STAT (SET/KITS/TRAYS/PACK) ×1 IMPLANT
TRAY FOLEY MTR SLVR 16FR STAT (SET/KITS/TRAYS/PACK) IMPLANT
WATER STERILE IRR 1000ML POUR (IV SOLUTION) ×8 IMPLANT

## 2021-04-02 NOTE — Progress Notes (Signed)
Assisted Dr. Nunzio Cobbs with Right Knee Adductor Canal block. Side rails up, monitors on throughout procedure. See vital signs in flow sheet. Tolerated Procedure well.

## 2021-04-02 NOTE — Anesthesia Procedure Notes (Signed)
Date/Time: 04/02/2021 4:02 PM Performed by: Cynda Familia, CRNA Pre-anesthesia Checklist: Patient identified, Patient being monitored, Suction available, Timeout performed and Emergency Drugs available Patient Re-evaluated:Patient Re-evaluated prior to induction Oxygen Delivery Method: Simple face mask Placement Confirmation: positive ETCO2 and breath sounds checked- equal and bilateral Dental Injury: Teeth and Oropharynx as per pre-operative assessment

## 2021-04-02 NOTE — Anesthesia Postprocedure Evaluation (Signed)
Anesthesia Post Note  Patient: Carrie [name]  Procedure(s) Performed: Right TOTAL KNEE ARTHROPLASTY (Right: Knee) Right Knee HARDWARE REMOVAL (Right)     Anesthesia Type: Regional Anesthetic complications: no   No notable events documented.  Last Vitals:  Vitals:   04/02/21 1930 04/02/21 1945  BP: (!) 141/88 (!) 149/85  Pulse: 80 73  Resp: 19 15  Temp:  36.7 C  SpO2: 99% 99%    Last Pain:  Vitals:   04/02/21 1930  TempSrc:   PainSc: 0-No pain                 Pervis Hocking

## 2021-04-02 NOTE — Interval H&P Note (Signed)
History and Physical Interval Note: The patient understands that she is here today for a right knee replacement to treat her right knee osteoarthritis and her failed patellofemoral knee replacement.  There has been no interval or acute change in her medical status.  Please see recent H&P.  The risks and benefits of surgery been explained in detail and informed consent is obtained.  The right knee has been marked.  04/02/2021 12:36 PM  Carrie [name]  has presented today for surgery, with the diagnosis of Osteoarthritis / Retained Hardware Right Knee.  The various methods of treatment have been discussed with the patient and family. After consideration of risks, benefits and other options for treatment, the patient has consented to  Procedure(s): Right TOTAL KNEE ARTHROPLASTY (Right) Right Knee HARDWARE REMOVAL (Right) as a surgical intervention.  The patient's history has been reviewed, patient examined, no change in status, stable for surgery.  I have reviewed the patient's chart and labs.  Questions were answered to the patient's satisfaction.     Mcarthur Rossetti

## 2021-04-02 NOTE — Transfer of Care (Signed)
Immediate Anesthesia Transfer of Care Note  Patient: Carrie [name]  Procedure(s) Performed: Right TOTAL KNEE ARTHROPLASTY (Right: Knee) Right Knee HARDWARE REMOVAL (Right)  Patient Location: PACU  Anesthesia Type:Spinal  Level of Consciousness: awake and alert   Airway & Oxygen Therapy: Spont vent/mask O2  Post-op Assessment: Report given to RN and Post -op Vital signs reviewed and stable  Post vital signs: Reviewed  Last Vitals:  Vitals Value Taken Time  BP 129/96 04/02/21 1800  Temp    Pulse 78 04/02/21 1801  Resp 15 04/02/21 1801  SpO2 100 % 04/02/21 1801  Vitals shown include unvalidated device data.  Last Pain:  Vitals:   04/02/21 1328  TempSrc:   PainSc: 8       Patients Stated Pain Goal: 4 (55/97/41 6384)  Complications: No notable events documented.

## 2021-04-02 NOTE — Anesthesia Procedure Notes (Signed)
Anesthesia Regional Block: Adductor canal block   Pre-Anesthetic Checklist: , timeout performed,  Correct Patient, Correct Site, Correct Laterality,  Correct Procedure, Correct Position, site marked,  Risks and benefits discussed,  Surgical consent,  Pre-op evaluation,  At surgeon's request and post-op pain management  Laterality: Right  Prep: Maximum Sterile Barrier Precautions used, chloraprep       Needles:  Injection technique: Single-shot  Needle Type: Echogenic Stimulator Needle     Needle Length: 9cm  Needle Gauge: 22     Additional Needles:   Procedures:,,,, ultrasound used (permanent image in chart),,    Narrative:  Start time: 04/02/2021 3:10 PM End time: 04/02/2021 3:15 PM Injection made incrementally with aspirations every 5 mL.  Performed by: Personally  Anesthesiologist: Pervis Hocking, DO  Additional Notes: Monitors applied. No increased pain on injection. No increased resistance to injection. Injection made in 5cc increments. Good needle visualization. Patient tolerated procedure well.

## 2021-04-02 NOTE — Brief Op Note (Signed)
04/02/2021  5:38 PM  PATIENT:  Carrie [name]  51 y.o. female  PRE-OPERATIVE DIAGNOSIS:  Osteoarthritis / Retained Hardware Right Knee  POST-OPERATIVE DIAGNOSIS:  Osteoarthritis / Retained Hardware Right Knee  PROCEDURE:  Procedure(s): Right TOTAL KNEE ARTHROPLASTY (Right) Right Knee HARDWARE REMOVAL (Right)  SURGEON:  Surgeon(s) and Role:    Mcarthur Rossetti, MD - Primary  PHYSICIAN ASSISTANT:  Benita Stabile, PA-C  ANESTHESIA:   local, regional, and spinal  EBL:  125 mL   COUNTS:  YES  TOURNIQUET:   Total Tourniquet Time Documented: Thigh (Right) - 53 minutes Total: Thigh (Right) - 53 minutes   DICTATION: .Other Dictation: Dictation Number [id number]  PLAN OF CARE: Admit for overnight observation  PATIENT DISPOSITION:  PACU - hemodynamically stable.   Delay start of Pharmacological VTE agent (>24hrs) due to surgical blood loss or risk of bleeding: no

## 2021-04-02 NOTE — Anesthesia Procedure Notes (Signed)
Spinal  Patient location during procedure: OR Start time: 04/02/2021 4:09 PM End time: 04/02/2021 4:14 PM Reason for block: surgical anesthesia Staffing Performed: anesthesiologist  Anesthesiologist: Pervis Hocking, DO Preanesthetic Checklist Completed: patient identified, IV checked, risks and benefits discussed, surgical consent, monitors and equipment checked, pre-op evaluation and timeout performed Spinal Block Patient position: sitting Prep: DuraPrep and site prepped and draped Patient monitoring: cardiac monitor, continuous pulse ox and blood pressure Approach: midline Location: L3-4 Injection technique: single-shot Needle Needle type: Pencan  Needle gauge: 24 G Needle length: 9 cm Assessment Sensory level: T6 Events: CSF return Additional Notes Functioning IV was confirmed and monitors were applied. Sterile prep and drape, including hand hygiene and sterile gloves were used. The patient was positioned and the spine was prepped. The skin was anesthetized with lidocaine.  Free flow of clear CSF was obtained prior to injecting local anesthetic into the CSF.  The spinal needle aspirated freely following injection.  The needle was carefully withdrawn.  The patient tolerated the procedure well.

## 2021-04-03 ENCOUNTER — Other Ambulatory Visit: Payer: Self-pay

## 2021-04-03 DIAGNOSIS — Z7984 Long term (current) use of oral hypoglycemic drugs: Secondary | ICD-10-CM | POA: Diagnosis not present

## 2021-04-03 DIAGNOSIS — I10 Essential (primary) hypertension: Secondary | ICD-10-CM | POA: Diagnosis not present

## 2021-04-03 DIAGNOSIS — M1711 Unilateral primary osteoarthritis, right knee: Secondary | ICD-10-CM | POA: Diagnosis not present

## 2021-04-03 DIAGNOSIS — Z7901 Long term (current) use of anticoagulants: Secondary | ICD-10-CM | POA: Diagnosis not present

## 2021-04-03 DIAGNOSIS — Z7982 Long term (current) use of aspirin: Secondary | ICD-10-CM | POA: Diagnosis not present

## 2021-04-03 DIAGNOSIS — Z20822 Contact with and (suspected) exposure to covid-19: Secondary | ICD-10-CM | POA: Diagnosis not present

## 2021-04-03 LAB — BASIC METABOLIC PANEL
Anion gap: 9 (ref 5–15)
BUN: 12 mg/dL (ref 6–20)
CO2: 23 mmol/L (ref 22–32)
Calcium: 8.5 mg/dL — ABNORMAL LOW (ref 8.9–10.3)
Chloride: 105 mmol/L (ref 98–111)
Creatinine, Ser: 0.63 mg/dL (ref 0.44–1.00)
GFR, Estimated: >60 mL/min (ref >60)
Glucose, Bld: 193 mg/dL — ABNORMAL HIGH (ref 70–99)
Potassium: 4.1 mmol/L (ref 3.5–5.1)
Sodium: 137 mmol/L (ref 135–145)

## 2021-04-03 LAB — CBC
HCT: 38.7 % (ref 36.0–46.0)
Hemoglobin: 12.8 g/dL (ref 12.0–15.0)
MCH: 31.1 pg (ref 26.0–34.0)
MCHC: 33.1 g/dL (ref 30.0–36.0)
MCV: 93.9 fL (ref 80.0–100.0)
Platelets: 304 10*3/uL (ref 150–400)
RBC: 4.12 MIL/uL (ref 3.87–5.11)
RDW: 13.9 % (ref 11.5–15.5)
WBC: 12.5 10*3/uL — ABNORMAL HIGH (ref 4.0–10.5)
nRBC: 0 % (ref 0.0–0.2)

## 2021-04-03 LAB — GLUCOSE, CAPILLARY
Glucose-Capillary: 176 mg/dL — ABNORMAL HIGH (ref 70–99)
Glucose-Capillary: 187 mg/dL — ABNORMAL HIGH (ref 70–99)
Glucose-Capillary: 139 mg/dL — ABNORMAL HIGH (ref 70–99)
Glucose-Capillary: 133 mg/dL — ABNORMAL HIGH (ref 70–99)

## 2021-04-03 MED ORDER — OXYCODONE HCL 5 MG PO TABS: 5.0000 mg | ORAL_TABLET | ORAL | 0 refills | Status: DC | PRN

## 2021-04-03 MED ORDER — ONDANSETRON 4 MG PO TBDP
4.0000 mg | ORAL_TABLET | Freq: Three times a day (TID) | ORAL | 0 refills | Status: DC | PRN
Start: 2021-04-03 — End: 2022-02-28

## 2021-04-03 MED ORDER — ASPIRIN 81 MG PO CHEW: 81.0000 mg | CHEWABLE_TABLET | Freq: Two times a day (BID) | ORAL | 0 refills | Status: DC

## 2021-04-03 MED ORDER — METHOCARBAMOL 500 MG PO TABS: 500.0000 mg | ORAL_TABLET | Freq: Four times a day (QID) | ORAL | 0 refills | Status: DC | PRN

## 2021-04-03 NOTE — TOC Transition Note (Addendum)
Transition of Care [facility]) - CM/SW Discharge Note  Patient Details  Name: Carrie [name] MRN: [id number] Date of Birth: 1970/07/27  [facility] CM/SW Contact:  Sherie Don, LCSW Phone Number: 04/03/2021, 9:59 AM  Clinical Narrative: Patient is expected to discharge home after working with PT. CSW spoke with patient to confirm discharge plan and needs. Patient will need HHPT, but [facility] declined the referral. Patient has a rolling walker and elevated toilet at home, so there are no DME needs at this time. CSW made HHPT referral to [facility] with Alvis Lemmings, which was accepted. CSW updated patient. TOC signing off.  Final next level of care: [place] Barriers to Discharge: No Barriers Identified  Patient Goals and CMS Choice Patient states their goals for this hospitalization and ongoing recovery are:: Discharge home with [place] CMS Medicare.gov Compare Post Acute Care list provided to:: Patient Choice offered to / list presented to : Patient  Discharge Plan and Services        DME Arranged: N/A DME Agency: NA HH Arranged: PT HH Agency: [place] Date [place]: 04/03/21 Representative spoke with at [facility] [place]: Tommi Rumps  Readmission Risk Interventions No flowsheet data found.

## 2021-04-03 NOTE — Op Note (Signed)
NAME: Carrie [name], Carrie [name] MEDICAL RECORD NO: [id number] ACCOUNT NO: [id number] DATE OF BIRTH: 1970-10-03 FACILITY: [id number] PHYSICIAN: Lind Guest. Ninfa Linden, MD  Operative Report   DATE OF PROCEDURE: 04/02/2021  PREOPERATIVE DIAGNOSES: 1.  Primary osteoarthritis and degenerative joint disease, right knee. 2.  Previous patellofemoral arthroplasty, right knee.  POSTOPERATIVE DIAGNOSES: 1.  Primary osteoarthritis and degenerative joint disease, right knee. 2.  Previous patellofemoral arthroplasty, right knee.  PROCEDURE: 1.  Removal of right knee patellofemoral arthroplasty. 2.  Right primary total knee arthroplasty.  IMPLANTS:  Biomet Zimmer Persona knee system with size right narrow femur, CR femur, size C right tibial tray, 12 mm medial congruent polyethylene insert.  SURGEON:.  Lind Guest. Ninfa Linden, MD  ASSISTANT:  Erskine Emery, PA-C  ANESTHESIA: 1.  Right lower extremity adductor canal block. 2.  Spinal. 3.  Local with Exparel and saline around the arthrotomy.  TOURNIQUET TIME:  Under one hour.  ANTIBIOTICS:  2 g IV Ancef.  ESTIMATED BLOOD LOSS:  Less than 100 mL.  COMPLICATIONS:  None.  INDICATIONS:  The patient is a 51 year old female who has a 13-year history of a patellofemoral arthroplasty that was placed in 2010 for isolated patellofemoral arthritis.  This was done elsewhere.  Over the years, she has developed worsening  osteoarthritis in the medial and lateral compartments of her knee and this was confirmed with MRI of her knee.  At this point, with recurrent effusion, continued right knee pain with weightbearing and the detrimental affect it is having on her quality of  life and activities of daily living as well as her mobility, she does wish to proceed with a total knee arthroplasty.  We talked about removing the patellofemoral arthroplasty and converting this to a total knee arthroplasty.  We talked about the risk  of acute blood loss anemia,  nerve or vessel injury, fracture, infection, DVT, implant failure and skin and soft tissue issues.  We talked about our goals being decreased pain, improve mobility and overall improve quality of life.  DESCRIPTION OF PROCEDURE:  After informed consent was obtained, appropriate right knee was marked and adductor canal block was obtained in the right lower extremity in the holding room.  She was then brought to the operating room and sat up on the  operating table where spinal anesthesia was obtained.  She was laid in supine position on the operating table.  A Foley catheter was placed and a nonsterile tourniquet was placed around her upper right thigh.  Her right thigh, knee, leg, ankle and foot  were prepped and draped with DuraPrep and sterile drapes including a sterile stockinette.  A timeout was called identified and she was identified as correct patient, correct right knee.  We then used Esmarch to wrap that leg and tourniquet was inflated  to 300 mm of pressure.  We then made an incision, which was a curvilinear incision through her previous incision from the patellofemoral arthrotomy.  This was a medial based incision and we carried this proximally and distally.  We dissected down the  knee joint, carried out a medial parapatellar arthrotomy.  We found a moderate joint effusion.  The patellar component and the trochlear groove component from her previous patellofemoral arthroplasty was intact.  She had full-thickness cartilage loss in  areas over the medial femoral condyle and lateral femoral condyle as well as the tibial plateau.  We removed remnants of the ACL, medial and lateral meniscus.  With the knee in a flexed position, we first made  our proximal tibia cut correcting for varus  and valgus and a 3-degree slope.  We made this to take 2 mm off the low side and 10 mm off the high side.  We made this cut without difficulty.  We then went to the femur and removed the trochlea component of the  patellofemoral placement.  This was  easily removed without any significant bone loss.  We removed cement debris as well.  We then used the intramedullary guide for making our distal femoral cut, setting this for a right knee at 5 degrees externally rotated and 10 mm distal femoral cut.  We  made this cut without difficulty, and brought the knee back down to full extension. With a 10 mm extension block, we achieved full extension.  We then went back to the femur and put our femoral sizing guide based off the epicondylar axis and Whitesides  line.  Based off of this, we chose a size 5 femur.  We put a 4-in-1 cutting block for a size 5 femur, made our anterior and posterior cuts followed by our chamfer cuts.  We then went back to the tibia and chose a size C right tibia tray for coverage of  the tibial plateau. We set the rotation off the tibial tubercle and the femur, made our keel punch and drill hole off of this.  We then trialed our size C right tibia, followed by our size 5 right femur.  We trialled a 10 mm fixed bearing polyethylene  insert, which was a medial congruent insert, and went up to 12 mm medial insert.  We were pleased with stability and range of motion with a 12 mm insert.  Of note, the polyethylene component of the patella was intact, so we left that in place.  We then  put the knee through several cycles of motion with the trial components in place and we were pleased with range of motion and stability.  We then removed all trial components from the knee and dried the knee real well.  We irrigated with normal saline  solution using pulsatile lavage.  We then dried the knee again and placed our Exparel around the arthrotomy.  We then mixed our cement with the knee in a flexed position. Cemented our Biomet Zimmer Persona right tibial tray size C. followed by our right  5 narrow femur.  We placed our 12 mm medial congruent fixed bearing polyethylene insert.  We then brought the knee back down  to full extension, removed cement debris from the knee and held the knee compressed and fully extended for the cement to harden.   Once it hardened, we put the knee through several cycles of motion.  Again, we were pleased with range of motion and stability.  We then let the tourniquet down and hemostasis was obtained with electrocautery.  The arthrotomy was closed with #1 Vicryl  suture, followed by 0 Vicryl to close the deep tissue and 2-0 Vicryl to close the subcutaneous tissue.  The skin was closed with staples.  A well-padded sterile dressing was applied.  She was taken to recovery room in stable condition with all final  counts being correct and no complications noted.  Of note, Erskine Emery, PA-C did assist during the entire case.  His assistance was crucial for facilitating all aspects of this case.   NIK D: 04/02/2021 5:37:09 pm T: 04/03/2021 2:22:00 am  JOB: [id number]/ [id number]

## 2021-04-03 NOTE — Discharge Instructions (Signed)

## 2021-04-03 NOTE — Progress Notes (Signed)
Subjective: 1 Day Post-Op Procedure(s) (LRB): Right TOTAL KNEE ARTHROPLASTY (Right) Right Knee HARDWARE REMOVAL (Right) Patient reports pain as moderate.  Significant nausea and vomiting this morning.  Objective: Vital signs in last 24 hours: Temp:  [97.5 F (36.4 C)-98.2 F (36.8 C)] 97.5 F (36.4 C) (02/18 0951) Pulse Rate:  [67-101] 67 (02/18 0951) Resp:  [11-20] 20 (02/18 0951) BP: (117-154)/(67-97) 117/92 (02/18 0951) SpO2:  [92 %-100 %] 93 % (02/18 0951) Weight:  [70.1 kg] 70.1 kg (02/17 1328)  Intake/Output from previous day: 02/17 0701 - 02/18 0700 In: 2598.9 [P.O.:840; I.V.:1558.9; IV Piggyback:200] Out: 1175 [Urine:1050; Blood:125] Intake/Output this shift: No intake/output data recorded.  Recent Labs    04/03/21 0353  HGB 12.8   Recent Labs    04/03/21 0353  WBC 12.5*  RBC 4.12  HCT 38.7  PLT 304   Recent Labs    04/03/21 0353  NA 137  K 4.1  CL 105  CO2 23  BUN 12  CREATININE 0.63  GLUCOSE 193*  CALCIUM 8.5*   No results for input(s): LABPT, INR in the last 72 hours.  Sensation intact distally Intact pulses distally Dorsiflexion/Plantar flexion intact Incision: dressing C/D/I No cellulitis present Compartment soft   Assessment/Plan: 1 Day Post-Op Procedure(s) (LRB): Right TOTAL KNEE ARTHROPLASTY (Right) Right Knee HARDWARE REMOVAL (Right) Up with therapy Plan for discharge tomorrow Discharge home with home health      Mcarthur Rossetti 04/03/2021, 10:53 AM

## 2021-04-03 NOTE — Progress Notes (Addendum)
PT NOTE  04/03/21 1500  PT Visit Information  Last PT Received On 04/03/21  Assistance Needed +1  Pt continues to mobilize well however  continues to have significant N/V despite meds therefore incr amb deferred. Pt returned to bed and initiated TKA HEP. Tol exercises well despite not having had pain meds.  History of Present Illness 51 yo female s/p Removal of right knee patellofemoral arthroplasty(2011), conversion to  Right primary TKA. PMH: arthritis. esophagitis  Subjective Data  Patient Stated Goal home  Precautions  Precautions Fall;Knee  Required Braces or Orthoses Knee Immobilizer - Right  Knee Immobilizer - Right Discontinue once straight leg raise with < 10 degree lag  Restrictions  Weight Bearing Restrictions No  Other Position/Activity Restrictions WBAT  Pain Assessment  Pain Assessment 0-10  Pain Score 7  Pain Location right knee  Pain Descriptors / Indicators Aching;Grimacing;Sore  Pain Intervention(s) Limited activity within patient's tolerance;Monitored during session;Premedicated before session;Repositioned;Ice applied  Cognition  Arousal/Alertness Awake/alert  Behavior During Therapy WFL for tasks assessed/performed  Overall Cognitive Status Within Functional Limits for tasks assessed  Bed Mobility  Overal bed mobility Needs Assistance  Bed Mobility Sit to Supine  Sit to supine Min assist  General bed mobility comments assist with RLE, incr time d/t nausea  Transfers  Overall transfer level Needs assistance  Equipment used Rolling walker (2 wheels)  Transfers Sit to/from Stand;Bed to chair/wheelchair/BSC  Sit to Stand Min assist;Min guard  Bed to/from chair/wheelchair/BSC transfer type: Step pivot  Step pivot transfers Min guard  General transfer comment cues for hand placement and RLE position, cues for sequencing pivot  Ambulation/Gait  General Gait Details  (deferred d/t nausea)  Total Joint Exercises  Ankle Circles/Pumps AROM;Both;10 reps  Quad Sets  AROM;Both;10 reps  Heel Slides AAROM;Right;10 reps  PT - End of Session  Activity Tolerance Other (comment) (ltd by N/V)  Patient left in bed;with call bell/phone within reach;with bed alarm set   PT - Assessment/Plan  PT Plan Current plan remains appropriate  PT Visit Diagnosis Other abnormalities of gait and mobility (R26.89);Difficulty in walking, not elsewhere classified (R26.2)  PT Frequency (ACUTE ONLY) 7X/week  Follow Up Recommendations Follow physician's recommendations for discharge plan and follow up therapies  Assistance recommended at discharge Intermittent Supervision/Assistance  Patient can return home with the following Help with stairs or ramp for entrance;Assist for transportation;Assistance with cooking/housework  PT equipment None recommended by PT  AM-PAC PT "6 Clicks" Mobility Outcome Measure (Version 2)  Help needed turning from your back to your side while in a flat bed without using bedrails? 3  Help needed moving from lying on your back to sitting on the side of a flat bed without using bedrails? 3  Help needed moving to and from a bed to a chair (including a wheelchair)? 3  Help needed standing up from a chair using your arms (e.g., wheelchair or bedside chair)? 3  Help needed to walk in hospital room? 3  Help needed climbing 3-5 steps with a railing?  2  6 Click Score 17  Consider Recommendation of Discharge To: Home with [facility]  Progressive Mobility  Activity Transferred from chair to bed  PT Goal Progression  Progress towards PT goals Progressing toward goals  Acute Rehab PT Goals  PT Goal Formulation With patient  Time For Goal Achievement 04/10/21  Potential to Achieve Goals Good  PT Time Calculation  PT Start Time (ACUTE ONLY) 1539  PT Stop Time (ACUTE ONLY) 1556  PT Time Calculation (  min) (ACUTE ONLY) 17 min  PT General Charges  $$ ACUTE PT VISIT 1 Visit  PT Treatments  $Therapeutic Exercise 8-22 mins

## 2021-04-03 NOTE — Evaluation (Signed)
Physical Therapy Evaluation Patient Details Name: Carrie [name] MRN: [id number] DOB: [birth year] Today's Date: 04/03/2021  History of Present Illness  51 yo female s/p Removal of right knee patellofemoral arthroplasty(2011), conversion to  Right primary TKA. PMH: arthritis. esophagitis  Clinical Impression  Pt is s/p THA resulting in the deficits listed below (see PT Problem List).  Pt mobilizing well but having some N/V. Limited gait d/t incr nausea with mobility.   Pt will benefit from skilled PT to increase their independence and safety with mobility to allow discharge to the venue listed below.         Recommendations for follow up therapy are one component of a multi-disciplinary discharge planning process, led by the attending physician.  Recommendations may be updated based on patient status, additional functional criteria and insurance authorization.  Follow Up Recommendations Follow physician's recommendations for discharge plan and follow up therapies    Assistance Recommended at Discharge    Patient can return home with the following  Help with stairs or ramp for entrance;Assist for transportation;Assistance with cooking/housework    Equipment Recommendations None recommended by PT  Recommendations for Other Services       Functional Status Assessment Patient has had a recent decline in their functional status and demonstrates the ability to make significant improvements in function in a reasonable and predictable amount of time.     Precautions / Restrictions Precautions Precautions: Fall Required Braces or Orthoses: Knee Immobilizer - Right Knee Immobilizer - Right: Discontinue once straight leg raise with < 10 degree lag Restrictions Weight Bearing Restrictions: No Other Position/Activity Restrictions: WBAT      Mobility  Bed Mobility Overal bed mobility: Needs Assistance Bed Mobility: Supine to Sit     Supine to sit: Min assist     General bed mobility  comments: assist with RLE, incr time    Transfers Overall transfer level: Needs assistance Equipment used: Rolling walker (2 wheels) Transfers: Sit to/from Stand Sit to Stand: Min assist, Min guard           General transfer comment: cues for hand placement and RLE position    Ambulation/Gait Ambulation/Gait assistance: Min guard Gait Distance (Feet): 16 Feet (x2) Assistive device: Rolling walker (2 wheels) Gait Pattern/deviations: Step-to pattern       General Gait Details: cues for sequence and RW position  Stairs            Wheelchair Mobility    Modified Rankin (Stroke Patients Only)       Balance                                             Pertinent Vitals/Pain Pain Assessment Pain Assessment: 0-10 Pain Score: 4  Pain Location: right knee Pain Descriptors / Indicators: Aching, Grimacing, Sore Pain Intervention(s): Repositioned, Monitored during session, Limited activity within patient's tolerance, Premedicated before session    Home Living Family/patient expects to be discharged to:: Private residence Living Arrangements: Spouse/significant other;Children Available Help at Discharge: Family Type of Home: House Home Access: Stairs to enter Entrance Stairs-Rails: Right Entrance Stairs-Number of Steps: 3   Home Layout: One level Home Equipment: Conservation officer, nature (2 wheels)      Prior Function Prior Level of Function : Independent/Modified Independent                     Hand Dominance  Extremity/Trunk Assessment   Upper Extremity Assessment Upper Extremity Assessment: Overall WFL for tasks assessed    Lower Extremity Assessment Lower Extremity Assessment: RLE deficits/detail RLE Deficits / Details: ankle WFL, knee and hip grossly 2+/5       Communication      Cognition Arousal/Alertness: Awake/alert Behavior During Therapy: WFL for tasks assessed/performed Overall Cognitive Status: Within  Functional Limits for tasks assessed                                          General Comments      Exercises     Assessment/Plan    PT Assessment Patient needs continued PT services  PT Problem List Decreased strength;Decreased range of motion;Decreased activity tolerance;Decreased mobility;Pain;Decreased knowledge of use of DME;Decreased balance       PT Treatment Interventions DME instruction;Therapeutic exercise;Functional mobility training;Therapeutic activities;Patient/family education;Gait training    PT Goals (Current goals can be found in the Care Plan section)  Acute Rehab PT Goals Patient Stated Goal: home PT Goal Formulation: With patient Time For Goal Achievement: 04/10/21 Potential to Achieve Goals: Good    Frequency 7X/week     Co-evaluation               AM-PAC PT "6 Clicks" Mobility  Outcome Measure Help needed turning from your back to your side while in a flat bed without using bedrails?: A Little Help needed moving from lying on your back to sitting on the side of a flat bed without using bedrails?: A Little Help needed moving to and from a bed to a chair (including a wheelchair)?: A Little Help needed standing up from a chair using your arms (e.g., wheelchair or bedside chair)?: A Little Help needed to walk in hospital room?: A Little Help needed climbing 3-5 steps with a railing? : A Lot 6 Click Score: 17    End of Session Equipment Utilized During Treatment: Gait belt Activity Tolerance: Patient tolerated treatment well Patient left: in chair;with call bell/phone within reach;with chair alarm set   PT Visit Diagnosis: Other abnormalities of gait and mobility (R26.89);Difficulty in walking, not elsewhere classified (R26.2)    Time: 0174-9449 PT Time Calculation (min) (ACUTE ONLY): 25 min   Charges:   PT Evaluation $PT Eval Low Complexity: 1 Low PT Treatments $Gait Training: 8-22 mins        Baxter Flattery, PT  Acute  Rehab Dept ([place]) [phone] Pager [phone]  04/03/2021   [facility] 04/03/2021, 1:34 PM

## 2021-04-04 DIAGNOSIS — Z20822 Contact with and (suspected) exposure to covid-19: Secondary | ICD-10-CM | POA: Diagnosis not present

## 2021-04-04 DIAGNOSIS — Z7982 Long term (current) use of aspirin: Secondary | ICD-10-CM | POA: Diagnosis not present

## 2021-04-04 DIAGNOSIS — M1711 Unilateral primary osteoarthritis, right knee: Secondary | ICD-10-CM | POA: Diagnosis not present

## 2021-04-04 DIAGNOSIS — Z7901 Long term (current) use of anticoagulants: Secondary | ICD-10-CM | POA: Diagnosis not present

## 2021-04-04 DIAGNOSIS — I10 Essential (primary) hypertension: Secondary | ICD-10-CM | POA: Diagnosis not present

## 2021-04-04 DIAGNOSIS — Z7984 Long term (current) use of oral hypoglycemic drugs: Secondary | ICD-10-CM | POA: Diagnosis not present

## 2021-04-04 LAB — GLUCOSE, CAPILLARY
Glucose-Capillary: 141 mg/dL — ABNORMAL HIGH (ref 70–99)
Glucose-Capillary: 127 mg/dL — ABNORMAL HIGH (ref 70–99)
Glucose-Capillary: 140 mg/dL — ABNORMAL HIGH (ref 70–99)
Glucose-Capillary: 147 mg/dL — ABNORMAL HIGH (ref 70–99)

## 2021-04-04 NOTE — Progress Notes (Signed)
Physical Therapy Treatment Patient Details Name: Carrie [name] MRN: [id number] DOB: Jun 22, 1970 Today's Date: 04/04/2021   History of Present Illness 51 yo female s/p Removal of right knee patellofemoral arthroplasty(2011), conversion to  Right primary TKA. PMH: arthritis. esophagitis    PT Comments    Pt continues to progress with mobility however with ongoing nausea & vomiting. Hasn't had anything to eat or drink, RN placed her back on IVF.  Pt amb ~ 65', 10' with RW and supervision, participated in exercises and tol well. Pt declined to practice stairs d/t nausea increasing with gait, PT in agreement.  Concerned for d/c today given ongoing N/V, still limiting mobility although improved overall from yesterday.  Discussed with RN.  Recommendations for follow up therapy are one component of a multi-disciplinary discharge planning process, led by the attending physician.  Recommendations may be updated based on patient status, additional functional criteria and insurance authorization.  Follow Up Recommendations  Follow physician's recommendations for discharge plan and follow up therapies     Assistance Recommended at Discharge Intermittent Supervision/Assistance  Patient can return home with the following Help with stairs or ramp for entrance;Assist for transportation;Assistance with cooking/housework   Equipment Recommendations  None recommended by PT    Recommendations for Other Services       Precautions / Restrictions Precautions Precautions: Fall;Knee Required Braces or Orthoses: Knee Immobilizer - Right Knee Immobilizer - Right: Discontinue once straight leg raise with < 10 degree lag Restrictions Weight Bearing Restrictions: No Other Position/Activity Restrictions: WBAT     Mobility  Bed Mobility Overal bed mobility: Needs Assistance Bed Mobility: Sit to Supine, Supine to Sit     Supine to sit: Min assist Sit to supine: Min assist   General bed mobility  comments: light min assist with RLE, pt self assists RLE with UEs    Transfers Overall transfer level: Needs assistance Equipment used: Rolling walker (2 wheels) Transfers: Sit to/from Stand, Bed to chair/wheelchair/BSC Sit to Stand: Min guard, Supervision           General transfer comment: cues for hand placement, supervision for safety    Ambulation/Gait Ambulation/Gait assistance: Min guard Gait Distance (Feet): 65 Feet (10' more) Assistive device: Rolling walker (2 wheels) Gait Pattern/deviations: Step-to pattern, Step-through pattern, Decreased stance time - right       General Gait Details: cues for sequence and RW position (deferred d/t nausea)   Stairs             Wheelchair Mobility    Modified Rankin (Stroke Patients Only)       Balance                                            Cognition Arousal/Alertness: Awake/alert Behavior During Therapy: WFL for tasks assessed/performed Overall Cognitive Status: Within Functional Limits for tasks assessed                                          Exercises Total Joint Exercises Ankle Circles/Pumps: AROM, Both, 10 reps Quad Sets: AROM, Both, 10 reps Heel Slides: AAROM, Right, 10 reps Straight Leg Raises: AROM, AAROM, Right, 10 reps Knee Flexion: AAROM, Right, 5 reps, Seated Goniometric ROM: ~ 8 to 70 degrees R knee flexion- AAROM    General Comments  Pertinent Vitals/Pain Pain Assessment Pain Assessment: 0-10 Pain Score: 4  Pain Location: right knee Pain Descriptors / Indicators: Aching, Grimacing, Sore Pain Intervention(s): Limited activity within patient's tolerance, Monitored during session, Premedicated before session, Repositioned    Home Living                          Prior Function            PT Goals (current goals can now be found in the care plan section) Acute Rehab PT Goals Patient Stated Goal: home PT Goal Formulation:  With patient Time For Goal Achievement: 04/10/21 Potential to Achieve Goals: Good Progress towards PT goals: Progressing toward goals    Frequency    7X/week      PT Plan Current plan remains appropriate    Co-evaluation              AM-PAC PT "6 Clicks" Mobility   Outcome Measure  Help needed turning from your back to your side while in a flat bed without using bedrails?: A Little Help needed moving from lying on your back to sitting on the side of a flat bed without using bedrails?: A Little Help needed moving to and from a bed to a chair (including a wheelchair)?: A Little Help needed standing up from a chair using your arms (e.g., wheelchair or bedside chair)?: A Little Help needed to walk in hospital room?: A Little Help needed climbing 3-5 steps with a railing? : A Lot 6 Click Score: 17    End of Session Equipment Utilized During Treatment: Gait belt Activity Tolerance: Patient tolerated treatment well Patient left: in bed;with call bell/phone within reach;with bed alarm set   PT Visit Diagnosis: Other abnormalities of gait and mobility (R26.89);Difficulty in walking, not elsewhere classified (R26.2)     Time: 1224-8250 PT Time Calculation (min) (ACUTE ONLY): 25 min  Charges:  $Gait Training: 8-22 mins $Therapeutic Exercise: 8-22 mins                     Baxter Flattery, PT  Acute Rehab Dept ([place]) [phone] Pager [phone]  04/04/2021    [facility] 04/04/2021, 12:39 PM

## 2021-04-04 NOTE — Discharge Summary (Signed)
Discharge Diagnoses:  Principal Problem:   Unilateral primary osteoarthritis, right knee Active Problems:   Status post total knee replacement, right   Surgeries: Procedure(s): Right TOTAL KNEE ARTHROPLASTY Right Knee HARDWARE REMOVAL on 04/02/2021    Consultants:   Discharged Condition: Improved  Hospital Course: Carrie [name] is an 51 y.o. female who was admitted 04/02/2021 with a chief complaint of osteoarthritis right knee, with a final diagnosis of Osteoarthritis / Retained Hardware Right Knee.  Patient was brought to the operating room on 04/02/2021 and underwent Procedure(s): Right TOTAL KNEE ARTHROPLASTY Right Knee HARDWARE REMOVAL.    Patient was given perioperative antibiotics:  Anti-infectives (From admission, onward)    Start     Dose/Rate Route Frequency Ordered Stop   04/02/21 2200  ceFAZolin (ANCEF) IVPB 1 g/50 mL premix        1 g 100 mL/hr over 30 Minutes Intravenous Every 6 hours 04/02/21 2105 04/03/21 0430   04/02/21 1300  ceFAZolin (ANCEF) IVPB 2g/100 mL premix        2 g 200 mL/hr over 30 Minutes Intravenous On call to O.R. 04/02/21 1251 04/02/21 1615     .  Patient was given sequential compression devices, early ambulation, and aspirin for DVT prophylaxis.  Recent vital signs: Patient Vitals for the past 24 hrs:  BP Temp Temp src Pulse Resp SpO2  04/04/21 0630 139/63 98.8 F (37.1 C) -- 78 17 97 %  04/03/21 2059 (!) 156/94 98.3 F (36.8 C) -- 81 16 98 %  04/03/21 1703 (!) 153/88 98.1 F (36.7 C) Oral 69 14 100 %  04/03/21 1421 138/83 97.6 F (36.4 C) Oral 64 14 97 %  04/03/21 0951 (!) 117/92 (!) 97.5 F (36.4 C) Oral 67 20 93 %  .  Recent laboratory studies: DG Knee Right Port  Result Date: 04/02/2021 CLINICAL DATA:  Status post right knee replacement. EXAM: PORTABLE RIGHT KNEE - 1-2 VIEW COMPARISON:  11/30/2020 FINDINGS: Interval revision of the prior right patellofemoral unicompartmental arthroplasty to a total right knee arthroplasty. No  perihardware lucency is seen to indicate hardware failure or loosening. Expected postoperative changes including subcutaneous and intra-articular air and fluid. Moderate soft tissue swelling. Surgical skin staples overlie the anterior knee. IMPRESSION: Status post total right knee arthroplasty without evidence of hardware failure. Electronically Signed   By: Yvonne Kendall M.D.   On: 04/02/2021 18:31    Discharge Medications:   Allergies as of 04/04/2021       Reactions   Codeine Other (See Comments)   Severe headaches        Medication List     STOP taking these medications    aspirin 81 MG EC tablet Replaced by: aspirin 81 MG chewable tablet       TAKE these medications    aspirin 81 MG chewable tablet Chew 1 tablet (81 mg total) by mouth 2 (two) times daily. Replaces: aspirin 81 MG EC tablet   atorvastatin 10 MG tablet Commonly known as: LIPITOR Take 10 mg by mouth at bedtime.   estradiol 0.1 mg/24hr patch Commonly known as: CLIMARA - Dosed in mg/24 hr Place 0.1 mg onto the skin every Sunday.   ibuprofen 200 MG tablet Commonly known as: ADVIL Take 800 mg by mouth every 6 (six) hours as needed for moderate pain or headache.   losartan 100 MG tablet Commonly known as: COZAAR Take 100 mg by mouth daily.   metFORMIN 1000 MG tablet Commonly known as: GLUCOPHAGE Take 1,000 mg by mouth  2 (two) times daily.   methocarbamol 500 MG tablet Commonly known as: ROBAXIN Take 1 tablet (500 mg total) by mouth every 6 (six) hours as needed for muscle spasms.   ondansetron 4 MG disintegrating tablet Commonly known as: ZOFRAN-ODT Take 1 tablet (4 mg total) by mouth every 8 (eight) hours as needed for nausea or vomiting.   oxyCODONE 5 MG immediate release tablet Commonly known as: Oxy IR/ROXICODONE Take 1-2 tablets (5-10 mg total) by mouth every 4 (four) hours as needed for moderate pain (pain score 4-6).   pantoprazole 40 MG tablet Commonly known as: PROTONIX Take 40 mg by  mouth daily.   traMADol 50 MG tablet Commonly known as: ULTRAM Take 1 tablet (50 mg total) by mouth every 6 (six) hours as needed.               Durable Medical Equipment  (From admission, onward)           Start     Ordered   04/02/21 2106  DME 3 n 1  Once        04/02/21 2105   04/02/21 2106  DME Walker rolling  Once       Question Answer Comment  Walker: With 5 Inch Wheels   Patient needs a walker to treat with the following condition Status post total right knee replacement      04/02/21 2105            Diagnostic Studies: DG Knee Right Port  Result Date: 04/02/2021 CLINICAL DATA:  Status post right knee replacement. EXAM: PORTABLE RIGHT KNEE - 1-2 VIEW COMPARISON:  11/30/2020 FINDINGS: Interval revision of the prior right patellofemoral unicompartmental arthroplasty to a total right knee arthroplasty. No perihardware lucency is seen to indicate hardware failure or loosening. Expected postoperative changes including subcutaneous and intra-articular air and fluid. Moderate soft tissue swelling. Surgical skin staples overlie the anterior knee. IMPRESSION: Status post total right knee arthroplasty without evidence of hardware failure. Electronically Signed   By: Yvonne Kendall M.D.   On: 04/02/2021 18:31    Patient benefited maximally from their hospital stay and there were no complications.     Disposition: Discharge disposition: 01-Home or Self Care      Discharge Instructions     Call MD / Call 911   Complete by: As directed    If you experience chest pain or shortness of breath, CALL 911 and be transported to the hospital emergency room.  If you develope a fever above 101 F, pus (white drainage) or increased drainage or redness at the wound, or calf pain, call your surgeon's office.   Constipation Prevention   Complete by: As directed    Drink plenty of fluids.  Prune juice may be helpful.  You may use a stool softener, such as Colace (over the counter)  100 mg twice a day.  Use MiraLax (over the counter) for constipation as needed.   Diet - low sodium heart healthy   Complete by: As directed    Increase activity slowly as tolerated   Complete by: As directed    Post-operative opioid taper instructions:   Complete by: As directed    POST-OPERATIVE OPIOID TAPER INSTRUCTIONS: It is important to wean off of your opioid medication as soon as possible. If you do not need pain medication after your surgery it is ok to stop day one. Opioids include: Codeine, Hydrocodone(Norco, Vicodin), Oxycodone(Percocet, oxycontin) and hydromorphone amongst others.  Long term and even short term  use of opiods can cause: Increased pain response Dependence Constipation Depression Respiratory depression And more.  Withdrawal symptoms can include Flu like symptoms Nausea, vomiting And more Techniques to manage these symptoms Hydrate well Eat regular healthy meals Stay active Use relaxation techniques(deep breathing, meditating, yoga) Do Not substitute Alcohol to help with tapering If you have been on opioids for less than two weeks and do not have pain than it is ok to stop all together.  Plan to wean off of opioids This plan should start within one week post op of your joint replacement. Maintain the same interval or time between taking each dose and first decrease the dose.  Cut the total daily intake of opioids by one tablet each day Next start to increase the time between doses. The last dose that should be eliminated is the evening dose.          Follow-up Information     Mcarthur Rossetti, MD Follow up in 2 week(s).   Specialty: Orthopedic Surgery Contact information: [place] [address] [phone]         Care, [facility] Follow up.   Specialty: Home Health Services Why: Alvis Lemmings will provide PT in the home. Contact information: [place] [address] [phone]                   Signed: Newt Minion 04/04/2021, 9:15 AM

## 2021-04-04 NOTE — Progress Notes (Signed)
PT NOTE    04/04/21 1500  PT Visit Information  Last PT Received On 04/04/21  Assistance Needed +1  Pt agreeable to OOB/amb with encouragement. Pt reports she still has not eaten or had anything to drink, she is reluctant to mobilize d/t N/V. No vomiting this pm but incr nausea with mobility.  Pt able amb incr distance despite feeling weak from lack of sustenance    History of Present Illness 51 yo female s/p Removal of right knee patellofemoral arthroplasty(2011), conversion to  Right primary TKA. PMH: arthritis. esophagitis  Subjective Data  Patient Stated Goal home  Precautions  Precautions Fall;Knee  Required Braces or Orthoses Knee Immobilizer - Right  Knee Immobilizer - Right Discontinue once straight leg raise with < 10 degree lag  Restrictions  Weight Bearing Restrictions No  Other Position/Activity Restrictions WBAT  Pain Assessment  Pain Assessment 0-10  Pain Score 4  Pain Location right knee  Pain Descriptors / Indicators Aching;Grimacing;Sore  Pain Intervention(s) Limited activity within patient's tolerance;Monitored during session;Premedicated before session;Ice applied  Cognition  Arousal/Alertness Awake/alert  Behavior During Therapy WFL for tasks assessed/performed  Overall Cognitive Status Within Functional Limits for tasks assessed  Bed Mobility  Overal bed mobility Needs Assistance  Bed Mobility Sit to Supine;Supine to Sit  Supine to sit Min guard  Sit to supine Min assist;Min guard  General bed mobility comments light min assist with RLE fully on to bed, pt self assists RLE with UEs to come to sit  Transfers  Overall transfer level Needs assistance  Equipment used Rolling walker (2 wheels)  Transfers Sit to/from Stand;Bed to chair/wheelchair/BSC  Sit to Stand Min guard;Supervision  General transfer comment cues for hand placement, supervision for safety  Ambulation/Gait  Ambulation/Gait assistance Supervision  Gait Distance (Feet) 80 Feet (10')   Assistive device Rolling walker (2 wheels)  Gait Pattern/deviations Step-to pattern;Step-through pattern;Decreased stance time - right  General Gait Details cues for sequence and RW position (deferred d/t nausea)  PT - End of Session  Equipment Utilized During Treatment Gait belt  Activity Tolerance Patient tolerated treatment well  Patient left in bed;with call bell/phone within reach;with bed alarm set;with family/visitor present   PT - Assessment/Plan  PT Plan Current plan remains appropriate  PT Visit Diagnosis Other abnormalities of gait and mobility (R26.89);Difficulty in walking, not elsewhere classified (R26.2)  PT Frequency (ACUTE ONLY) 7X/week  Follow Up Recommendations Follow physician's recommendations for discharge plan and follow up therapies  Assistance recommended at discharge Intermittent Supervision/Assistance  Patient can return home with the following Help with stairs or ramp for entrance;Assist for transportation;Assistance with cooking/housework  PT equipment None recommended by PT  AM-PAC PT "6 Clicks" Mobility Outcome Measure (Version 2)  Help needed turning from your back to your side while in a flat bed without using bedrails? 3  Help needed moving from lying on your back to sitting on the side of a flat bed without using bedrails? 3  Help needed moving to and from a bed to a chair (including a wheelchair)? 3  Help needed standing up from a chair using your arms (e.g., wheelchair or bedside chair)? 3  Help needed to walk in hospital room? 3  Help needed climbing 3-5 steps with a railing?  2  6 Click Score 17  Consider Recommendation of Discharge To: Home with [facility]  Progressive Mobility  What is the highest level of mobility based on the progressive mobility assessment? Level 5 (Walks with assist in room/hall) - Balance while  stepping forward/back and can walk in room with assist - Complete  Activity Ambulated with assistance in hallway  PT Goal Progression   Progress towards PT goals Progressing toward goals  Acute Rehab PT Goals  PT Goal Formulation With patient  Time For Goal Achievement 04/10/21  Potential to Achieve Goals Good  PT Time Calculation  PT Start Time (ACUTE ONLY) 1457  PT Stop Time (ACUTE ONLY) 1515  PT Time Calculation (min) (ACUTE ONLY) 18 min  PT General Charges  $$ ACUTE PT VISIT 1 Visit  PT Treatments  $Gait Training 8-22 mins

## 2021-04-05 DIAGNOSIS — I10 Essential (primary) hypertension: Secondary | ICD-10-CM | POA: Diagnosis not present

## 2021-04-05 DIAGNOSIS — Z7982 Long term (current) use of aspirin: Secondary | ICD-10-CM | POA: Diagnosis not present

## 2021-04-05 DIAGNOSIS — Z7984 Long term (current) use of oral hypoglycemic drugs: Secondary | ICD-10-CM | POA: Diagnosis not present

## 2021-04-05 DIAGNOSIS — Z7901 Long term (current) use of anticoagulants: Secondary | ICD-10-CM | POA: Diagnosis not present

## 2021-04-05 DIAGNOSIS — M1711 Unilateral primary osteoarthritis, right knee: Secondary | ICD-10-CM | POA: Diagnosis not present

## 2021-04-05 DIAGNOSIS — Z20822 Contact with and (suspected) exposure to covid-19: Secondary | ICD-10-CM | POA: Diagnosis not present

## 2021-04-05 LAB — GLUCOSE, CAPILLARY
Glucose-Capillary: 126 mg/dL — ABNORMAL HIGH (ref 70–99)
Glucose-Capillary: 126 mg/dL — ABNORMAL HIGH (ref 70–99)

## 2021-04-05 NOTE — Progress Notes (Signed)
Patient ID: Carrie [name], female   DOB: 11/25/1970, 51 y.o.   MRN: [id number] Vitals stable this am and right operative knee stable.  Mobility has been slow due to nausea.  Wants to try to go home today.

## 2021-04-05 NOTE — Progress Notes (Signed)
Provided discharge education/instructions, all questions and concerns addressed. Pt not in acute distress, discharged home with belongings accompanied by husband. 

## 2021-04-05 NOTE — Progress Notes (Signed)
Physical Therapy Treatment Patient Details Name: Carrie [name] MRN: [id number] DOB: 1971-01-31 Today's Date: 04/05/2021   History of Present Illness 51 yo female s/p Removal of right knee patellofemoral arthroplasty(2011), conversion to  Right primary TKA. PMH: arthritis. esophagitis    PT Comments    Pt progressing well with mobility. Reviewed stairs and pt did well. Primary problem continues to be N/V. Pt is ready to d/c with family assist as needed from PT standpoint.   Recommendations for follow up therapy are one component of a multi-disciplinary discharge planning process, led by the attending physician.  Recommendations may be updated based on patient status, additional functional criteria and insurance authorization.  Follow Up Recommendations  Follow physician's recommendations for discharge plan and follow up therapies     Assistance Recommended at Discharge Intermittent Supervision/Assistance  Patient can return home with the following Help with stairs or ramp for entrance;Assist for transportation;Assistance with cooking/housework   Equipment Recommendations  None recommended by PT    Recommendations for Other Services       Precautions / Restrictions Precautions Precautions: Fall;Knee Precaution Booklet Issued: No Precaution Comments: KI not utilized, pt is unable to do SLR I'ly however good quad activation, no knee buckling with mobility Restrictions Weight Bearing Restrictions: No Other Position/Activity Restrictions: WBAT     Mobility  Bed Mobility Overal bed mobility: Needs Assistance Bed Mobility: Sit to Supine, Supine to Sit     Supine to sit: Min guard, Supervision Sit to supine: Supervision, Min assist   General bed mobility comments: able to self assist with gait belt    Transfers Overall transfer level: Needs assistance Equipment used: Rolling walker (2 wheels) Transfers: Sit to/from Stand Sit to Stand: Supervision, Modified independent  (Device/Increase time)           General transfer comment: pt demonstrates good carryover, correct hand placement    Ambulation/Gait Ambulation/Gait assistance: Supervision Gait Distance (Feet): 60 Feet Assistive device: Rolling walker (2 wheels) Gait Pattern/deviations: Step-to pattern, Step-through pattern, Decreased stance time - right       General Gait Details: supervision for safety, no LOB, good gait stability with RW support   Stairs Stairs: Yes Stairs assistance: Min guard Stair Management: Two rails, Step to pattern, Forwards Number of Stairs: 3 General stair comments: cues for sequence, good stability   Wheelchair Mobility    Modified Rankin (Stroke Patients Only)       Balance                                            Cognition Arousal/Alertness: Awake/alert Behavior During Therapy: WFL for tasks assessed/performed Overall Cognitive Status: Within Functional Limits for tasks assessed                                          Exercises Total Joint Exercises Ankle Circles/Pumps: AROM, Both, 10 reps    General Comments        Pertinent Vitals/Pain Pain Assessment Pain Assessment: 0-10 Pain Score: 2  Pain Location: right knee Pain Descriptors / Indicators: Aching, Sore Pain Intervention(s): Limited activity within patient's tolerance, Monitored during session, Premedicated before session, Repositioned, Ice applied    Home Living  Prior Function            PT Goals (current goals can now be found in the care plan section) Acute Rehab PT Goals Patient Stated Goal: home PT Goal Formulation: With patient Time For Goal Achievement: 04/10/21 Potential to Achieve Goals: Good Progress towards PT goals: Progressing toward goals    Frequency    7X/week      PT Plan Current plan remains appropriate    Co-evaluation              AM-PAC PT "6 Clicks" Mobility    Outcome Measure  Help needed turning from your back to your side while in a flat bed without using bedrails?: A Little Help needed moving from lying on your back to sitting on the side of a flat bed without using bedrails?: A Little Help needed moving to and from a bed to a chair (including a wheelchair)?: None Help needed standing up from a chair using your arms (e.g., wheelchair or bedside chair)?: None Help needed to walk in hospital room?: A Little Help needed climbing 3-5 steps with a railing? : A Little 6 Click Score: 20    End of Session Equipment Utilized During Treatment: Gait belt Activity Tolerance: Patient tolerated treatment well Patient left: in bed;with call bell/phone within reach;with bed alarm set Nurse Communication: Mobility status PT Visit Diagnosis: Other abnormalities of gait and mobility (R26.89);Difficulty in walking, not elsewhere classified (R26.2)     Time: 7124-5809 PT Time Calculation (min) (ACUTE ONLY): 19 min  Charges:  $Gait Training: 8-22 mins                     Baxter Flattery, PT  Acute Rehab Dept ([place]) [phone] Pager [phone]  04/05/2021    [facility] 04/05/2021, 11:10 AM

## 2021-04-05 NOTE — Plan of Care (Signed)
°  Problem: Education: Goal: Knowledge of General Education information will improve Description: Including pain rating scale, medication(s)/side effects and non-pharmacologic comfort measures Outcome: Progressing   Problem: Health Behavior/Discharge Planning: Goal: Ability to manage health-related needs will improve Outcome: Progressing   Problem: Clinical Measurements: Goal: Ability to maintain clinical measurements within normal limits will improve Outcome: Progressing   Problem: Nutrition: Goal: Adequate nutrition will be maintained Outcome: Progressing   Problem: Coping: Goal: Level of anxiety will decrease Outcome: Progressing   Problem: Elimination: Goal: Will not experience complications related to bowel motility Outcome: Progressing   Problem: Pain Managment: Goal: General experience of comfort will improve Outcome: Progressing   Problem: Safety: Goal: Ability to remain free from injury will improve Outcome: Progressing

## 2021-04-06 ENCOUNTER — Encounter (HOSPITAL_COMMUNITY): Payer: Self-pay | Admitting: Orthopaedic Surgery

## 2021-04-15 ENCOUNTER — Encounter: Payer: Self-pay | Admitting: Orthopaedic Surgery

## 2021-04-15 ENCOUNTER — Other Ambulatory Visit: Payer: Self-pay

## 2021-04-15 ENCOUNTER — Ambulatory Visit (INDEPENDENT_AMBULATORY_CARE_PROVIDER_SITE_OTHER): Payer: BC Managed Care – PPO | Admitting: Orthopaedic Surgery

## 2021-04-15 DIAGNOSIS — Z96651 Presence of right artificial knee joint: Secondary | ICD-10-CM

## 2021-04-15 MED ORDER — OXYCODONE HCL 5 MG PO TABS: 5.0000 mg | ORAL_TABLET | ORAL | 0 refills | Status: DC | PRN

## 2021-04-15 NOTE — Progress Notes (Signed)
The patient is 2 weeks status post a right total knee arthroplasty.  We are asked to remove the patellofemoral arthroplasty to then replace her knee.  She unfortunately has had no therapy at home or outpatient since surgery.  Somehow the case managers at the hospital did not get anything set up for her. ? ?Her incision looks good from her right knee surgery.  The staples are removed and Steri-Strips applied.  Her calf is soft.  She has good flexion extension of her foot and ankle but her motion is severely limited of her right knee. ? ?It is essential to get an outpatient physical therapy soon as possible since she has had no therapy.  She did let me know that when she had the patellofemoral replacement years ago that she had to have a manipulation under anesthesia at that point.  Hopefully will not have to proceed with that but we will see what she looks like in 4 weeks from now.  I did refill her oxycodone as well. ?

## 2021-04-19 ENCOUNTER — Encounter (HOSPITAL_COMMUNITY): Payer: Self-pay

## 2021-04-19 ENCOUNTER — Ambulatory Visit (HOSPITAL_COMMUNITY): Payer: BC Managed Care – PPO | Attending: Orthopaedic Surgery

## 2021-04-19 ENCOUNTER — Other Ambulatory Visit: Payer: Self-pay

## 2021-04-19 DIAGNOSIS — M1711 Unilateral primary osteoarthritis, right knee: Secondary | ICD-10-CM | POA: Diagnosis not present

## 2021-04-19 DIAGNOSIS — M25561 Pain in right knee: Secondary | ICD-10-CM | POA: Insufficient documentation

## 2021-04-19 DIAGNOSIS — R262 Difficulty in walking, not elsewhere classified: Secondary | ICD-10-CM | POA: Insufficient documentation

## 2021-04-19 DIAGNOSIS — M25661 Stiffness of right knee, not elsewhere classified: Secondary | ICD-10-CM | POA: Insufficient documentation

## 2021-04-19 DIAGNOSIS — Z96651 Presence of right artificial knee joint: Secondary | ICD-10-CM | POA: Insufficient documentation

## 2021-04-19 DIAGNOSIS — G8929 Other chronic pain: Secondary | ICD-10-CM | POA: Insufficient documentation

## 2021-04-19 NOTE — Therapy (Signed)
[place], [place], [address] Phone: [phone]   Fax:  [phone]  Physical Therapy Evaluation  Patient Details  Name: Carrie [name] MRN: [id number] Date of Birth: Feb 04, 1971 Referring Provider (PT): Mcarthur Rossetti, MD   Encounter Date: 04/19/2021   PT End of Session - 04/19/21 0910     Visit Number 1    Number of Visits 24    Date for PT Re-Evaluation 06/19/21    Authorization Type BCBS no auth needed    PT Start Time 0816    PT Stop Time 0858    PT Time Calculation (min) 42 min    Equipment Utilized During Treatment Other (comment)   front wheeled walker   Activity Tolerance Patient tolerated treatment well;No increased pain    Behavior During Therapy WFL for tasks assessed/performed             Past Medical History:  Diagnosis Date   Arthritis    Essential hypertension    GERD (gastroesophageal reflux disease)    Mixed hyperlipidemia    PONV (postoperative nausea and vomiting)     Past Surgical History:  Procedure Laterality Date   ABDOMINAL HYSTERECTOMY     HARDWARE REMOVAL Right 04/02/2021   Procedure: Right Knee HARDWARE REMOVAL;  Surgeon: Mcarthur Rossetti, MD;  Location: [facility];  Service: Orthopedics;  Laterality: Right;   KNEE ARTHROSCOPY  2009   MASS EXCISION  02/11/2011   Procedure: EXCISION MASS;  Surgeon: Jessy Oto, MD;  Location: [place];  Service: Orthopedics;  Laterality: Right;  resection of neuroma right infrapatellar medial knee   PATELLA ARTHROPLASTY     rt knee-   TOTAL KNEE ARTHROPLASTY  2011   partial-[place] reg    TOTAL KNEE ARTHROPLASTY Right 04/02/2021   Procedure: Right TOTAL KNEE ARTHROPLASTY;  Surgeon: Mcarthur Rossetti, MD;  Location: [facility];  Service: Orthopedics;  Laterality: Right;   TUBAL LIGATION      There were no vitals filed for this visit.    Subjective Assessment - 04/19/21 0904     Subjective S:  Patient is a 51 year old female who reports to physical therapy evaluation for her post operative care of a right total knee replacement preformed 04/02/2021.  She reports that she has not had any therapy services yet as home health did not come and is having difficulty with her knee.  13 years ago fall with patellar dislocation with partical knee replacement post, she has had pain ever since and now has been given a full replacement.  States now not as much pain, however lots of swelling is of concern. Able to get around the house with modifications including increased time as its a struggle" and using FWW.  At home, she tries to bend it as much as I can and lay it straight out to see how straight.   Had difficulty with post anesthesia and finally back to eating better.    Pertinent History Prior R partical knee post patellar dislocation, multiple right knee surgeries    Limitations Sitting;Lifting;Standing;Walking;House hold activities    How long can you sit comfortably? 20 mins, needs to move    How long can you stand comfortably? 5 mins, needs to move    How long can you walk comfortably? 100 feet modified with FWW    Patient Stated Goals avoid manipulation, get knee to a good spot    Currently in Pain? Yes  Pain Score 3     Pain Location Knee    Pain Orientation Right    Pain Descriptors / Indicators Aching;Discomfort;Pressure    Pain Type Surgical pain    Pain Radiating Towards some statements of secondary right hip discomfort    Pain Onset More than a month ago    Pain Frequency Intermittent    Aggravating Factors  bending, walking, big movement    Pain Relieving Factors rest    Effect of Pain on Daily Activities slow, modified, able to do all but not efficiently                [facility] PT Assessment - 04/19/21 0001       Assessment   Medical Diagnosis Right total knee replacement    Referring Provider (PT) Mcarthur Rossetti, MD    Onset Date/Surgical Date 04/02/21     Next MD Visit 05/13/21    Prior Therapy Yes many years ago, none for current surgical course since leaving hospital      Precautions   Precautions None      Restrictions   Weight Bearing Restrictions No      Balance Screen   Has the patient fallen in the past 6 months No    Has the patient had a decrease in activity level because of a fear of falling?  Yes    Is the patient reluctant to leave their home because of a fear of falling?  Yes      Home Ecologist residence      Prior Function   Level of Independence Independent      Cognition   Overall Cognitive Status Within Functional Limits for tasks assessed      Observation/Other Assessments   Observations Large surgical inscision from distal to proximal knee that moves medial to patella with edema present, steri strips only remaining    Skin Integrity good      Observation/Other Assessments-Edema    Edema Circumferential      Circumferential Edema   Circumferential - Right 40 cm    Circumferential - Left  35.5 cm      Sensation   Light Touch Appears Intact      Coordination   Gross Motor Movements are Fluid and Coordinated Yes      Functional Tests   Functional tests Sit to Stand      Sit to Stand   Comments increased left LE avoiding right LE weight bearing, avoiding right LE knee flexion; mod use of B UEs      ROM / Strength   AROM / PROM / Strength AROM;PROM      AROM   Overall AROM  Deficits    Overall AROM Comments limited    AROM Assessment Site Knee    Right/Left Knee Right;Left    Right Knee Extension -14    Right Knee Flexion 50    Left Knee Extension 0    Left Knee Flexion 120      PROM   Overall PROM  Deficits    Overall PROM Comments limited    PROM Assessment Site Knee    Right/Left Knee Right    Right Knee Extension -10    Right Knee Flexion 60      Palpation   Patella mobility hypomobile      Ambulation/Gait   Ambulation/Gait Yes    Ambulation/Gait  Assistance 6: Modified independent (Device/Increase time)    Ambulation Distance (Feet) 50 Feet  Assistive device Rolling walker    Gait Pattern Step-to pattern;Decreased step length - right;Decreased stance time - right;Decreased hip/knee flexion - right;Decreased weight shift to right;Right hip hike    Ambulation Surface Level                        Objective measurements completed on examination: See above findings.                PT Education - 04/19/21 0908     Education Details POC, evaluation findings, post op knee replacement, elevation, movement; HEP original given heel slides, ankle pumps elevated, quad set, SAQ, SLR, hip abd supine, seated knee flexion AAROM and LAQ    Person(s) Educated Patient    Methods Explanation;Demonstration;Handout;Verbal cues    Comprehension Verbalized understanding;Returned demonstration              PT Short Term Goals - 04/19/21 0917       PT SHORT TERM GOAL #1   Title Patient will be able to improve right knee AROM to 0 degrees extension to improve gait mechanics and standing safety.    Baseline 04/19/21: -14 deg extension AROM    Time 1    Period Months    Status New    Target Date 05/20/21      PT SHORT TERM GOAL #2   Title Patient will be able to improve right knee flexion AROM to 80 degrees to improve function.    Baseline 04/19/21: Currently at 50 degrees AROM start of session and 62 degrees end of session    Time 1    Period Months    Status New    Target Date 05/20/21      PT SHORT TERM GOAL #3   Title Patient will be able to ambulate without use of front wheel walker throughout home for improved ADLs.    Baseline 04/19/21: Patient currently using front wheel walker consistently    Time 1    Period Months    Status New    Target Date 05/20/21               PT Long Term Goals - 04/19/21 0920       PT LONG TERM GOAL #1   Title Patient will be able to get to 0 degrees extension and at  least 110 degrees flexion for right knee AROM for proper mechanics.    Baseline 04/19/21: -14 deg extension and 62 degrees flexion end of session AROM    Time 2    Period Months    Status New    Target Date 06/19/21      PT LONG TERM GOAL #2   Title Patient will be able to ambulate in community over 30 minutes without assistive devices and with good form.    Baseline 04/19/21: using front wheeled walker at home and community    Time 2    Period Months    Status New    Target Date 06/19/21      PT LONG TERM GOAL #3   Title Patient will improve FOTO score to at least 69 degrees to show improved functional abilities    Baseline 04/19/21: 55 today    Time 2    Period Months    Status New    Target Date 06/19/21                    Plan - 04/19/21 9326  Clinical Impression Statement A: Patient is a 51 year old female who is attending physical therapy evaluation today for her right total knee replacement preformed on 04/02/2021. A delay in care secondary to no home health services has caused her to be behind expected post operative status as is urgent in increasing in clinic physical therapy care currently.  She presents with limited knee AROM to 14-50 degrees at start of session, 10-62 after session, and PROM 10-60 degrees with gross LE weakness, difficulty walking, and continued edema present.  These deficits are accompanied by poor endurance, limited activity tolerance, and limited functional mobility with ADLs. She is having to modify ADLs and is working at home as indicated by subjective information and objective measures which are affecting overall participation. At this time, the patient will benefit from skilled physical therapy in order to decrease symptoms, reduce impairment, and improve function.    Personal Factors and Comorbidities Past/Current Experience;Time since onset of injury/illness/exacerbation    Examination-Activity Limitations Locomotion  Level;Carry;Dressing;Hygiene/Grooming;Lift;Stand;Squat;Stairs    Examination-Participation Restrictions Cleaning;Community Activity;Laundry;Occupation;Yard Work    Merchant [employer] officer Stable/Uncomplicated    Designer, jewellery Low    Rehab Potential Fair    PT Frequency 3x / week    PT Duration 8 weeks    PT Treatment/Interventions ADLs/Self Care Home Management;Biofeedback;Gait training;Stair training;Functional mobility training;Therapeutic activities;Therapeutic exercise;Balance training;Neuromuscular re-education;Patient/family education;Manual techniques;Manual lymph drainage;Scar mobilization;Passive range of motion;Energy conservation;Taping;Vasopneumatic Device;Joint Manipulations    PT Next Visit Plan Review HEP specifically seated activities; trial of bike/nustep for continuous movement; 2MWT; manual tx as needed    PT Home Exercise Plan Access Code: [email or web address]  URL: [email or web address]  Date: 04/19/2021  Prepared by: Jerilynn Som    Exercises  Supine Heel Slides - 3-5 x daily - 7 x weekly - 2 sets - 20 reps  Supine Single Leg Ankle Pumps - 3-5 x daily - 7 x weekly - 2 sets - 20 reps  Supine Quad Set (Mirrored) - 3 x daily - 7 x weekly - 2 sets - 20 reps  Supine Short Arc Quad (Mirrored) - 3 x daily - 7 x weekly - 2 sets - 10 reps  Supine Active Straight Leg Raise - 3 x daily - 7 x weekly - 2 sets - 10 reps  Supine Hip Abduction - 3 x daily - 7 x weekly - 2 sets - 20 reps  Seated Knee Flexion AAROM - 3 x daily - 7 x weekly - 2 sets - 10 reps - 5 seconds hold  Seated Long Arc Quad - 3 x daily - 7 x weekly - 2 sets - 10 reps    Consulted and Agree with Plan of Care Patient             Patient will benefit from skilled therapeutic intervention in order to improve the following deficits and impairments:  Abnormal gait, Decreased range of motion, Difficulty walking, Increased fascial restricitons, Decreased endurance, Decreased activity tolerance, Pain,  Decreased balance, Decreased scar mobility, Hypomobility, Impaired flexibility, Decreased mobility, Decreased strength, Increased edema  Visit Diagnosis: Status post total knee replacement, right  Stiffness of right knee, not elsewhere classified  Chronic pain of right knee  Difficulty in walking, not elsewhere classified     Problem List Patient Active Problem List   Diagnosis Date Noted   Pain in right knee 04/19/2021   Stiffness of right knee, not elsewhere classified 04/19/2021   Difficulty in walking, not elsewhere classified 04/19/2021   Status post total knee replacement, right 04/02/2021  Allergic rhinitis due to pollen 25/83/4621   Eosinophilic esophagitis 94/71/2527   Rash and other nonspecific skin eruption 06/22/2020   Unilateral primary osteoarthritis, right knee 10/09/2017   AC joint arthropathy 01/04/2017   Acute pain of right shoulder 11/15/2016   Other derangements of patella, unspecified knee 09/02/2015   Recurrent dislocation of right patella 09/02/2015   Breast hypertrophy in female 05/13/2015   Neuroma of lower extremity 02/11/2011    Class: Chronic     9:34 AM, 04/19/21  Margarette Asal. Carlis Abbott PT, DPT  Contract Physical Therapist at  [facility] [place], [place], [address] Phone: [phone]   Fax:  [phone]  Name: DAYANA DALPORTO MRN: [id number] Date of Birth: 08/23/1970

## 2021-04-19 NOTE — Patient Instructions (Signed)
Access Code: [email or web address] ?URL: [email or web address] ?Date: 04/19/2021 ?Prepared by: Jerilynn Som ? ?Exercises ?Supine Heel Slides - 3-5 x daily - 7 x weekly - 2 sets - 20 reps ?Supine Single Leg Ankle Pumps - 3-5 x daily - 7 x weekly - 2 sets - 20 reps ?Supine Quad Set (Mirrored) - 3 x daily - 7 x weekly - 2 sets - 20 reps ?Supine Short Arc Quad (Mirrored) - 3 x daily - 7 x weekly - 2 sets - 10 reps ?Supine Active Straight Leg Raise - 3 x daily - 7 x weekly - 2 sets - 10 reps ?Supine Hip Abduction - 3 x daily - 7 x weekly - 2 sets - 20 reps ?Seated Knee Flexion AAROM - 3 x daily - 7 x weekly - 2 sets - 10 reps - 5 seconds hold ?Seated Long Arc Quad - 3 x daily - 7 x weekly - 2 sets - 10 reps ? ?Patient Education ?Total Knee Replacement Handout ?TKR Precautions Handout ?

## 2021-04-21 ENCOUNTER — Other Ambulatory Visit: Payer: Self-pay

## 2021-04-21 ENCOUNTER — Encounter (HOSPITAL_COMMUNITY): Payer: Self-pay

## 2021-04-21 ENCOUNTER — Ambulatory Visit (HOSPITAL_COMMUNITY): Payer: BC Managed Care – PPO

## 2021-04-21 DIAGNOSIS — M25561 Pain in right knee: Secondary | ICD-10-CM | POA: Diagnosis not present

## 2021-04-21 DIAGNOSIS — R262 Difficulty in walking, not elsewhere classified: Secondary | ICD-10-CM | POA: Diagnosis not present

## 2021-04-21 DIAGNOSIS — M25661 Stiffness of right knee, not elsewhere classified: Secondary | ICD-10-CM

## 2021-04-21 DIAGNOSIS — G8929 Other chronic pain: Secondary | ICD-10-CM | POA: Diagnosis not present

## 2021-04-21 DIAGNOSIS — Z96651 Presence of right artificial knee joint: Secondary | ICD-10-CM | POA: Diagnosis not present

## 2021-04-21 DIAGNOSIS — M1711 Unilateral primary osteoarthritis, right knee: Secondary | ICD-10-CM | POA: Diagnosis not present

## 2021-04-21 NOTE — Therapy (Signed)
[place], [place], [address] Phone: [phone]   Fax:  [phone]  Physical Therapy Treatment  Patient Details  Name: Carrie [name] MRN: [id number] Date of Birth: Sep 08, 1970 Referring Provider (PT): Mcarthur Rossetti, MD   Encounter Date: 04/21/2021   PT End of Session - 04/21/21 1119     Visit Number 2    Number of Visits 24    Date for PT Re-Evaluation 06/19/21    Authorization Type BCBS no auth needed    PT Start Time 1032    PT Stop Time 1114    PT Time Calculation (min) 42 min    Activity Tolerance Patient tolerated treatment well;No increased pain    Behavior During Therapy WFL for tasks assessed/performed             Past Medical History:  Diagnosis Date   Arthritis    Essential hypertension    GERD (gastroesophageal reflux disease)    Mixed hyperlipidemia    PONV (postoperative nausea and vomiting)     Past Surgical History:  Procedure Laterality Date   ABDOMINAL HYSTERECTOMY     HARDWARE REMOVAL Right 04/02/2021   Procedure: Right Knee HARDWARE REMOVAL;  Surgeon: Mcarthur Rossetti, MD;  Location: [facility];  Service: Orthopedics;  Laterality: Right;   KNEE ARTHROSCOPY  2009   MASS EXCISION  02/11/2011   Procedure: EXCISION MASS;  Surgeon: Jessy Oto, MD;  Location: [place];  Service: Orthopedics;  Laterality: Right;  resection of neuroma right infrapatellar medial knee   PATELLA ARTHROPLASTY     rt knee-   TOTAL KNEE ARTHROPLASTY  2011   partial-[place] reg    TOTAL KNEE ARTHROPLASTY Right 04/02/2021   Procedure: Right TOTAL KNEE ARTHROPLASTY;  Surgeon: Mcarthur Rossetti, MD;  Location: [facility];  Service: Orthopedics;  Laterality: Right;   TUBAL LIGATION      There were no vitals filed for this visit.   Subjective Assessment - 04/21/21 1033     Subjective A: Patient states that she did well after last session, felt really good and was working  on HEP consistently. She is feeling very stiff today in knee and continues to state that the swelling is getting in the way.    Pertinent History Prior R partical knee post patellar dislocation, multiple right knee surgeries    Limitations Sitting;Lifting;Standing;Walking;House hold activities    How long can you sit comfortably? 20 mins, needs to move    How long can you stand comfortably? 5 mins, needs to move    How long can you walk comfortably? 100 feet modified with FWW    Patient Stated Goals avoid manipulation, get knee to a good spot    Currently in Pain? Yes    Pain Score 3     Pain Location Knee    Pain Orientation Right;Mid    Pain Descriptors / Indicators Aching;Pressure    Pain Type Surgical pain    Pain Onset More than a month ago    Pain Frequency Intermittent    Multiple Pain Sites Yes    Pain Score 6    Pain Location Hip    Pain Orientation Right;Posterior    Pain Descriptors / Indicators Aching;Discomfort    Pain Type Other (Comment)    Pain Onset More than a month ago                [facility] PT Assessment - 04/21/21 0001  Assessment   Medical Diagnosis Right total knee replacement    Referring Provider (PT) Mcarthur Rossetti, MD    Onset Date/Surgical Date 04/02/21                           [facility] Adult PT Treatment/Exercise - 04/21/21 0001       Ambulation/Gait   Ambulation/Gait Yes    Ambulation/Gait Assistance 6: Modified independent (Device/Increase time)    Ambulation Distance (Feet) 100 Feet    Assistive device None    Gait Pattern Step-to pattern    Ambulation Surface Level    Gait velocity slow    Pre-Gait Activities Standing at table top for trial of increased knee flexion marching prior to gait training      Exercises   Exercises Knee/Hip      Knee/Hip Exercises: Stretches   Passive Hamstring Stretch Right;5 reps;10 seconds   with strap for B UE pull     Knee/Hip Exercises: Aerobic   Nustep 8 mins level 1  seat 8      Knee/Hip Exercises: Standing   Hip Abduction AROM;Stengthening;Both;2 sets;10 reps    Abduction Limitations increase rest when WB onto R      Knee/Hip Exercises: Seated   Heel Slides AROM;Right;1 set;5 reps   withcueing for quad relaxation     Knee/Hip Exercises: Supine   Quad Sets AROM;Strengthening;Right;2 sets;15 reps    Short Arc Quad Sets AROM;Right;2 sets;10 reps    Heel Slides AROM;Right;2 sets;10 reps      Manual Therapy   Manual Therapy Edema management;Joint mobilization;Soft tissue mobilization;Passive ROM    Manual therapy comments supine with two pillows under head and towel roll under foot    Edema Management elevated massage for drainage    Joint Mobilization trial of grade II AP extension mobs x 20 reps x 2 sets with towel roll under ankle    Soft tissue mobilization gross to R LE focusing on quads, calf, and anterior tip    Passive ROM knee extension OP holding 30 seconds x 2 in SLR; knee flexion supine OP holding 30 seconds x 4                       PT Short Term Goals - 04/19/21 0917       PT SHORT TERM GOAL #1   Title Patient will be able to improve right knee AROM to 0 degrees extension to improve gait mechanics and standing safety.    Baseline 04/19/21: -14 deg extension AROM    Time 1    Period Months    Status New    Target Date 05/20/21      PT SHORT TERM GOAL #2   Title Patient will be able to improve right knee flexion AROM to 80 degrees to improve function.    Baseline 04/19/21: Currently at 50 degrees AROM start of session and 62 degrees end of session    Time 1    Period Months    Status New    Target Date 05/20/21      PT SHORT TERM GOAL #3   Title Patient will be able to ambulate without use of front wheel walker throughout home for improved ADLs.    Baseline 04/19/21: Patient currently using front wheel walker consistently    Time 1    Period Months    Status New    Target Date 05/20/21  PT Long  Term Goals - 04/19/21 0920       PT LONG TERM GOAL #1   Title Patient will be able to get to 0 degrees extension and at least 110 degrees flexion for right knee AROM for proper mechanics.    Baseline 04/19/21: -14 deg extension and 62 degrees flexion end of session AROM    Time 2    Period Months    Status New    Target Date 06/19/21      PT LONG TERM GOAL #2   Title Patient will be able to ambulate in community over 30 minutes without assistive devices and with good form.    Baseline 04/19/21: using front wheeled walker at home and community    Time 2    Period Months    Status New    Target Date 06/19/21      PT LONG TERM GOAL #3   Title Patient will improve FOTO score to at least 69 degrees to show improved functional abilities    Baseline 04/19/21: 55 today    Time 2    Period Months    Status New    Target Date 06/19/21                   Plan - 04/21/21 1120     Clinical Impression Statement A: Patient attending first full treatment session after evaluation with good review of HEP. Todays session continued to build on prior evaluation with primary focus on R knee ROM secondary to continued stiffness and edema.  Patient demonstrated poor ROM at initiation of session with increased statement of feeling stiff today, however showed improved ROM post treatment focusing on manual edema release, PROM overpressure, and increased functional training for knee flexion marching and gait training.  Patient is a good candidate to continue with skilled physical therapy to progress current needs of post operative recovery while improving functional status as able.    Personal Factors and Comorbidities Past/Current Experience;Time since onset of injury/illness/exacerbation    Examination-Activity Limitations Locomotion Level;Carry;Dressing;Hygiene/Grooming;Lift;Stand;Squat;Stairs    Examination-Participation Restrictions Cleaning;Community Activity;Laundry;Occupation;Yard Work    [employer]  Potential Fair    PT Frequency 3x / week    PT Duration 8 weeks    PT Treatment/Interventions ADLs/Self Care Home Management;Biofeedback;Gait training;Stair training;Functional mobility training;Therapeutic activities;Therapeutic exercise;Balance training;Neuromuscular re-education;Patient/family education;Manual techniques;Manual lymph drainage;Scar mobilization;Passive range of motion;Energy conservation;Taping;Vasopneumatic Device;Joint Manipulations    PT Next Visit Plan Review HEP specifically seated activities; continue with nustep for continuous movement; add bike for ROM focus; 2MWT; manual tx as needed    Consulted and Agree with Plan of Care Patient             Patient will benefit from skilled therapeutic intervention in order to improve the following deficits and impairments:  Abnormal gait, Decreased range of motion, Difficulty walking, Increased fascial restricitons, Decreased endurance, Decreased activity tolerance, Pain, Decreased balance, Decreased scar mobility, Hypomobility, Impaired flexibility, Decreased mobility, Decreased strength, Increased edema  Visit Diagnosis: Stiffness of right knee, not elsewhere classified  Status post total knee replacement, right  Chronic pain of right knee  Difficulty in walking, not elsewhere classified     Problem List Patient Active Problem List   Diagnosis Date Noted   Pain in right knee 04/19/2021   Stiffness of right knee, not elsewhere classified 04/19/2021   Difficulty in walking, not elsewhere classified 04/19/2021   Status post total knee replacement, right 04/02/2021   Allergic rhinitis due to pollen 06/22/2020  Eosinophilic esophagitis 53/20/2334   Rash and other nonspecific skin eruption 06/22/2020   Unilateral primary osteoarthritis, right knee 10/09/2017   AC joint arthropathy 01/04/2017   Acute pain of right shoulder 11/15/2016   Other derangements of patella, unspecified knee 09/02/2015   Recurrent  dislocation of right patella 09/02/2015   Breast hypertrophy in female 05/13/2015   Neuroma of lower extremity 02/11/2011    Class: Chronic     11:23 AM, 04/21/21  Margarette Asal. Carlis Abbott PT, DPT  Contract Physical Therapist at  [facility] [place], [place], [address] Phone: [phone]   Fax:  [phone]  Name: Carrie [name] MRN: [id number] Date of Birth: 1970-07-06

## 2021-04-23 ENCOUNTER — Other Ambulatory Visit: Payer: Self-pay

## 2021-04-23 ENCOUNTER — Ambulatory Visit (HOSPITAL_COMMUNITY): Payer: BC Managed Care – PPO

## 2021-04-23 ENCOUNTER — Encounter (HOSPITAL_COMMUNITY): Payer: Self-pay

## 2021-04-23 DIAGNOSIS — M25561 Pain in right knee: Secondary | ICD-10-CM | POA: Diagnosis not present

## 2021-04-23 DIAGNOSIS — Z96651 Presence of right artificial knee joint: Secondary | ICD-10-CM

## 2021-04-23 DIAGNOSIS — M1711 Unilateral primary osteoarthritis, right knee: Secondary | ICD-10-CM | POA: Diagnosis not present

## 2021-04-23 DIAGNOSIS — M25661 Stiffness of right knee, not elsewhere classified: Secondary | ICD-10-CM

## 2021-04-23 DIAGNOSIS — R262 Difficulty in walking, not elsewhere classified: Secondary | ICD-10-CM

## 2021-04-23 DIAGNOSIS — G8929 Other chronic pain: Secondary | ICD-10-CM

## 2021-04-23 NOTE — Therapy (Signed)
OUTPATIENT PHYSICAL THERAPY TREATMENT NOTE   Patient Name: Carrie [name] MRN: [id number] DOB:1970/08/31, 51 y.o., female Today's Date: 04/23/2021  PCP: Jake Samples, PA-C REFERRING PROVIDER: Jake Samples, [place]*   PT End of Session - 04/23/21 1117     Visit Number 3    Number of Visits 24    Date for PT Re-Evaluation 06/19/21    Authorization Type BCBS no auth needed    PT Start Time 1115    PT Stop Time 1155    PT Time Calculation (min) 40 min    Activity Tolerance Patient tolerated treatment well;No increased pain    Behavior During Therapy WFL for tasks assessed/performed             Past Medical History:  Diagnosis Date   Arthritis    Essential hypertension    GERD (gastroesophageal reflux disease)    Mixed hyperlipidemia    PONV (postoperative nausea and vomiting)    Past Surgical History:  Procedure Laterality Date   ABDOMINAL HYSTERECTOMY     HARDWARE REMOVAL Right 04/02/2021   Procedure: Right Knee HARDWARE REMOVAL;  Surgeon: Mcarthur Rossetti, MD;  Location: [facility];  Service: Orthopedics;  Laterality: Right;   KNEE ARTHROSCOPY  2009   MASS EXCISION  02/11/2011   Procedure: EXCISION MASS;  Surgeon: Jessy Oto, MD;  Location: [facility];  Service: Orthopedics;  Laterality: Right;  resection of neuroma right infrapatellar medial knee   PATELLA ARTHROPLASTY     rt knee-   TOTAL KNEE ARTHROPLASTY  2011   partial-[place] reg    TOTAL KNEE ARTHROPLASTY Right 04/02/2021   Procedure: Right TOTAL KNEE ARTHROPLASTY;  Surgeon: Mcarthur Rossetti, MD;  Location: [facility];  Service: Orthopedics;  Laterality: Right;   TUBAL LIGATION     Patient Active Problem List   Diagnosis Date Noted   Pain in right knee 04/19/2021   Stiffness of right knee, not elsewhere classified 04/19/2021   Difficulty in walking, not elsewhere classified 04/19/2021   Status post total knee replacement, right 04/02/2021   Allergic rhinitis due to pollen  18/56/3149   Eosinophilic esophagitis 70/26/3785   Rash and other nonspecific skin eruption 06/22/2020   Unilateral primary osteoarthritis, right knee 10/09/2017   AC joint arthropathy 01/04/2017   Acute pain of right shoulder 11/15/2016   Other derangements of patella, unspecified knee 09/02/2015   Recurrent dislocation of right patella 09/02/2015   Breast hypertrophy in female 05/13/2015   Neuroma of lower extremity 02/11/2011    Class: Chronic    REFERRING DIAG: Right TKA  04/02/21 surgery date  THERAPY DIAG:  Stiffness of right knee, not elsewhere classified  Status post total knee replacement, right  Chronic pain of right knee  Difficulty in walking, not elsewhere classified  PERTINENT HISTORY: Prior right partial replacement 12+ years ago  PRECAUTIONS: none  SUBJECTIVE: Patient was doing exercises yesterday after good after last session and she had more increase just yesterday afternoon, concern for right back of knee strain and has back off exercises since.   PAIN:  Are you having pain? Yes: NPRS scale: 4/10 Pain location: Right knee posterior and med/lateral  Pain description: throbs, aches Aggravating factors: movement Relieving factors: none recently     TODAY'S TREATMENT:  04/23/21:  purposeful repeat from last, see assessment Ambulation/Gait     Ambulation/Gait Yes     Ambulation/Gait Assistance 6: Modified independent (Device/Increase time)     Ambulation Distance (Feet) 100 Feet  Assistive device None     Gait Pattern Step-to pattern     Ambulation Surface Level     Gait velocity slow     Pre-Gait Activities Standing at table top for trial of increased knee flexion marching prior to gait training          Exercises    Exercises Knee/Hip          Knee/Hip Exercises: Stretches    Passive Hamstring Stretch Right;5 reps;10 seconds   with strap for B UE pull         Knee/Hip Exercises: Aerobic    Nustep 8 mins level 1 seat 8          Knee/Hip  Exercises: Standing    Hip Abduction AROM;Stengthening;Both;2 sets;10 reps     Abduction Limitations increase rest when WB onto R          Knee/Hip Exercises: Seated    Heel Slides AROM;Right;1 set;5 reps   withcueing for quad relaxation         Knee/Hip Exercises: Supine    Quad Sets AROM;Strengthening;Right;2 sets;15 reps     Short Arc Quad Sets AROM;Right;2 sets;10 reps     Heel Slides AROM;Right;2 sets;10 reps          Manual Therapy    Manual Therapy Edema management;Joint mobilization;Soft tissue mobilization;Passive ROM     Manual therapy comments supine with two pillows under head and towel roll under foot     Edema Management elevated massage for drainage     Joint Mobilization trial of grade II AP extension mobs x 20 reps x 2 sets with towel roll under ankle     Soft tissue mobilization gross to R LE focusing on quads, calf, and anterior tip     Passive ROM knee extension OP holding 30 seconds x 2 in SLR; knee flexion supine OP holding 30 seconds x 4       PATIENT EDUCATION: Education details: Discussion and education on returning back into HEP with adding in one exercise at a time at each 2 hour interval to be able to do full HEP tomorrow as able, starting with heel slides and quad sets Person educated: Patient Education method: Explanation and Demonstration Education comprehension: verbalized understanding and returned demonstration   HOME EXERCISE PROGRAM: Access Code: [email or web address] URL: [email or web address] Date: 04/19/2021 Prepared by: Jerilynn Som   Exercises Supine Heel Slides - 3-5 x daily - 7 x weekly - 2 sets - 20 reps Supine Single Leg Ankle Pumps - 3-5 x daily - 7 x weekly - 2 sets - 20 reps Supine Quad Set (Mirrored) - 3 x daily - 7 x weekly - 2 sets - 20 reps Supine Short Arc Quad (Mirrored) - 3 x daily - 7 x weekly - 2 sets - 10 reps Supine Active Straight Leg Raise - 3 x daily - 7 x weekly - 2 sets - 10 reps Supine Hip Abduction - 3 x  daily - 7 x weekly - 2 sets - 20 reps Seated Knee Flexion AAROM - 3 x daily - 7 x weekly - 2 sets - 10 reps - 5 seconds hold Seated Long Arc Quad - 3 x daily - 7 x weekly - 2 sets - 10 reps   PT Short Term Goals -       PT SHORT TERM GOAL #1   Title Patient will be able to improve right knee AROM to 0 degrees extension to improve gait mechanics and standing safety.  Baseline 04/19/21: -14 deg extension AROM    Time 1    Period Months    Status New    Target Date 05/20/21      PT SHORT TERM GOAL #2   Title Patient will be able to improve right knee flexion AROM to 80 degrees to improve function.    Baseline 04/19/21: Currently at 50 degrees AROM start of session and 62 degrees end of session    Time 1    Period Months    Status New    Target Date 05/20/21      PT SHORT TERM GOAL #3   Title Patient will be able to ambulate without use of front wheel walker throughout home for improved ADLs.    Baseline 04/19/21: Patient currently using front wheel walker consistently    Time 1    Period Months    Status New    Target Date 05/20/21              PT Long Term Goals       PT LONG TERM GOAL #1   Title Patient will be able to get to 0 degrees extension and at least 110 degrees flexion for right knee AROM for proper mechanics.    Baseline 04/19/21: -14 deg extension and 62 degrees flexion end of session AROM    Time 2    Period Months    Status New    Target Date 06/19/21      PT LONG TERM GOAL #2   Title Patient will be able to ambulate in community over 30 minutes without assistive devices and with good form.    Baseline 04/19/21: using front wheeled walker at home and community    Time 2    Period Months    Status New    Target Date 06/19/21      PT LONG TERM GOAL #3   Title Patient will improve FOTO score to at least 69 degrees to show improved functional abilities    Baseline 04/19/21: 55 today    Time 2    Period Months    Status New    Target Date 06/19/21               Plan -     Clinical Impression Statement A: Todays session repeated prior session as patient has temporary good response for 24 hours and then increase symptoms yesterday with concern for irritation in posterior region after HEP.  Patient demonstrated tenderness to palpation in medial hamstrings with decreased ability to put knee into extension and ambulate.  Thus, todays session utilized manual techniques to release posterior region and continue with mobilization and exercises to encourage proper muscle recruitment and decreased guarding.  Patient showed improved ROM to 72 degrees flexion and -9 degrees extension post treatment focusing on manual edema release and Patient is a good candidate to continue with skilled physical therapy to progress current needs of post operative recovery while improving functional status as able.    Personal Factors and Comorbidities Past/Current Experience;Time since onset of injury/illness/exacerbation    Examination-Activity Limitations Locomotion Level;Carry;Dressing;Hygiene/Grooming;Lift;Stand;Squat;Stairs    Examination-Participation Restrictions Cleaning;Community Activity;Laundry;Occupation;Yard Work    [employer]    PT Frequency 3x / week    PT Duration 8 weeks    PT Treatment/Interventions ADLs/Self Care Home Management;Biofeedback;Gait training;Stair training;Functional mobility training;Therapeutic activities;Therapeutic exercise;Balance training;Neuromuscular re-education;Patient/family education;Manual techniques;Manual lymph drainage;Scar mobilization;Passive range of motion;Energy conservation;Taping;Vasopneumatic Device;Joint Manipulations    PT Next Visit Plan Review HEP specifically  seated activities; continue with nustep for continuous movement; add bike for ROM focus; 2MWT; manual tx as needed    Consulted and Agree with Plan of Care Patient                Jamse Belfast, PT, DPT 04/23/2021, 12:05 PM

## 2021-04-25 ENCOUNTER — Encounter: Payer: Self-pay | Admitting: Orthopaedic Surgery

## 2021-04-26 ENCOUNTER — Other Ambulatory Visit: Payer: Self-pay

## 2021-04-26 ENCOUNTER — Ambulatory Visit (HOSPITAL_COMMUNITY): Payer: BC Managed Care – PPO

## 2021-04-26 DIAGNOSIS — M1711 Unilateral primary osteoarthritis, right knee: Secondary | ICD-10-CM | POA: Diagnosis not present

## 2021-04-26 DIAGNOSIS — R262 Difficulty in walking, not elsewhere classified: Secondary | ICD-10-CM | POA: Diagnosis not present

## 2021-04-26 DIAGNOSIS — Z96651 Presence of right artificial knee joint: Secondary | ICD-10-CM | POA: Diagnosis not present

## 2021-04-26 DIAGNOSIS — M25661 Stiffness of right knee, not elsewhere classified: Secondary | ICD-10-CM

## 2021-04-26 DIAGNOSIS — G8929 Other chronic pain: Secondary | ICD-10-CM | POA: Diagnosis not present

## 2021-04-26 DIAGNOSIS — M25561 Pain in right knee: Secondary | ICD-10-CM | POA: Diagnosis not present

## 2021-04-26 NOTE — Therapy (Signed)
OUTPATIENT PHYSICAL THERAPY TREATMENT NOTE   Patient Name: Carrie [name] MRN: [id number] DOB:[birth year], 51 y.o., female Today's Date: 04/26/2021  PCP: Avis Epley, PA-C REFERRING PROVIDER: Avis Epley, PA*   PT End of Session - 04/26/21 1258     Visit Number 4    Number of Visits 24    Date for PT Re-Evaluation 06/19/21    Authorization Type BCBS no auth needed    PT Start Time 1256    PT Stop Time 1347    PT Time Calculation (min) 51 min    Activity Tolerance Patient tolerated treatment well;No increased pain    Behavior During Therapy WFL for tasks assessed/performed              Past Medical History:  Diagnosis Date   Arthritis    Essential hypertension    GERD (gastroesophageal reflux disease)    Mixed hyperlipidemia    PONV (postoperative nausea and vomiting)    Past Surgical History:  Procedure Laterality Date   ABDOMINAL HYSTERECTOMY     HARDWARE REMOVAL Right 04/02/2021   Procedure: Right Knee HARDWARE REMOVAL;  Surgeon: Kathryne Hitch, MD;  Location: [facility];  Service: Orthopedics;  Laterality: Right;   KNEE ARTHROSCOPY  2009   MASS EXCISION  02/11/2011   Procedure: EXCISION MASS;  Surgeon: Kerrin Champagne, MD;  Location: [facility];  Service: Orthopedics;  Laterality: Right;  resection of neuroma right infrapatellar medial knee   PATELLA ARTHROPLASTY     rt knee-   TOTAL KNEE ARTHROPLASTY  2011   partial-[place] reg    TOTAL KNEE ARTHROPLASTY Right 04/02/2021   Procedure: Right TOTAL KNEE ARTHROPLASTY;  Surgeon: Kathryne Hitch, MD;  Location: [facility];  Service: Orthopedics;  Laterality: Right;   TUBAL LIGATION     Patient Active Problem List   Diagnosis Date Noted   Pain in right knee 04/19/2021   Stiffness of right knee, not elsewhere classified 04/19/2021   Difficulty in walking, not elsewhere classified 04/19/2021   Status post total knee replacement, right 04/02/2021   Allergic rhinitis due to  pollen 06/22/2020   Eosinophilic esophagitis 06/22/2020   Rash and other nonspecific skin eruption 06/22/2020   Unilateral primary osteoarthritis, right knee 10/09/2017   AC joint arthropathy 01/04/2017   Acute pain of right shoulder 11/15/2016   Other derangements of patella, unspecified knee 09/02/2015   Recurrent dislocation of right patella 09/02/2015   Breast hypertrophy in female 05/13/2015   Neuroma of lower extremity 02/11/2011    Class: Chronic    REFERRING DIAG: Right TKA  04/02/21 surgery date  THERAPY DIAG:  Stiffness of right knee, not elsewhere classified  Status post total knee replacement, right  Chronic pain of right knee  Difficulty in walking, not elsewhere classified  PERTINENT HISTORY: Prior right partial replacement 12+ years ago  PRECAUTIONS: none  SUBJECTIVE: Patient reports her non surgical knee is bothering her 5/10 L knee; surgical knee 3/10 R knee. She has a call into the MD for an appt this week regarding non surgical knee.  PAIN:  Are you having pain? Yes Are you having pain? Yes: NPRS scale: 5/10 Pain location: Right knee posterior and med/lateral  Pain description: throbs, aches Aggravating factors: movement Relieving factors: none recently          TODAY'S TREATMENT:   Seated heel slides x 10 Seated heel slides with overpressure x 10 Supine knee extension hang x 5 min; towel roll under heel Manual STW  to  Right knee, especially medial hamstrings x 10 min; no other treatment performed at the time PROM Right  knee extension x 5 reps AAROM R knee flexion on table x 10 reps Standing in parallel bars gastroc stretch slant board 5 x 20 sec Standing knee flexion stretch on 6 inch step 2 x 10 Standing hamstring stretch on step 5 x 20 sec Gait training with emphasis on increased heel strike and toe off 100 ft with RW.           PATIENT EDUCATION: Education details: Discussion and education on returning back into HEP with adding in  one exercise at a time at each 2 hour interval to be able to do full HEP tomorrow as able, starting with heel slides and quad sets Person educated: Patient Education method: Explanation and Demonstration Education comprehension: verbalized understanding and returned demonstration   HOME EXERCISE PROGRAM: Access Code: [email or web address] URL: [email or web address] Date: 04/19/2021 Prepared by: Lonzo Cloud   Exercises Supine Heel Slides - 3-5 x daily - 7 x weekly - 2 sets - 20 reps Supine Single Leg Ankle Pumps - 3-5 x daily - 7 x weekly - 2 sets - 20 reps Supine Quad Set (Mirrored) - 3 x daily - 7 x weekly - 2 sets - 20 reps Supine Short Arc Quad (Mirrored) - 3 x daily - 7 x weekly - 2 sets - 10 reps Supine Active Straight Leg Raise - 3 x daily - 7 x weekly - 2 sets - 10 reps Supine Hip Abduction - 3 x daily - 7 x weekly - 2 sets - 20 reps Seated Knee Flexion AAROM - 3 x daily - 7 x weekly - 2 sets - 10 reps - 5 seconds hold Seated Long Arc Quad - 3 x daily - 7 x weekly - 2 sets - 10 reps  Access Code: [email or web address] URL: [email or web address] Date: 04/26/2021 Prepared by: [facility]  Exercises Standing Gastroc Stretch on Step - 2 x daily - 7 x weekly - 5 sets - 1 reps - 20 hold Standing Knee Flexion Stretch on Step - 2 x daily - 7 x weekly - 2 sets - 10 reps Standing Hamstring Stretch with Step - 2 x daily - 7 x weekly - 1 sets - 5 reps - 20 hold    PT Short Term Goals -       PT SHORT TERM GOAL #1   Title Patient will be able to improve right knee AROM to 0 degrees extension to improve gait mechanics and standing safety.    Baseline 04/19/21: -14 deg extension AROM    Time 1    Period Months    Status New    Target Date 05/20/21      PT SHORT TERM GOAL #2   Title Patient will be able to improve right knee flexion AROM to 80 degrees to improve function.    Baseline 04/26/21 AAROM R knee 80 degrees in sitting today.    Time 1    Period Months    Status New     Target Date 05/20/21      PT SHORT TERM GOAL #3   Title Patient will be able to ambulate without use of front wheel walker throughout home for improved ADLs.    Baseline 04/19/21: Patient currently using front wheel walker consistently    Time 1    Period Months    Status New    Target Date 05/20/21  PT Long Term Goals       PT LONG TERM GOAL #1   Title Patient will be able to get to 0 degrees extension and at least 110 degrees flexion for right knee AROM for proper mechanics.    Baseline 04/19/21: -14 deg extension and 62 degrees flexion end of session AROM    Time 2    Period Months    Status New    Target Date 06/19/21      PT LONG TERM GOAL #2   Title Patient will be able to ambulate in community over 30 minutes without assistive devices and with good form.    Baseline 04/19/21: using front wheeled walker at home and community    Time 2    Period Months    Status New    Target Date 06/19/21      PT LONG TERM GOAL #3   Title Patient will improve FOTO score to at least 69 degrees to show improved functional abilities    Baseline 04/19/21: 54 today    Time 2    Period Months    Status New    Target Date 06/19/21              Plan -     Clinical Impression Statement Today's session  focused on mobility of Right  knee.  She is limited today due to non surgical knee pain.  Patient is making progress with R knee flexion to 80 degrees but she continues significant Right knee extension lag.  Patient is a good candidate to continue with skilled physical therapy to progress current needs of post operative recovery while improving functional status as able.    Personal Factors and Comorbidities Past/Current Experience;Time since onset of injury/illness/exacerbation    Examination-Activity Limitations Locomotion Level;Carry;Dressing;Hygiene/Grooming;Lift;Stand;Squat;Stairs    Examination-Participation Restrictions Cleaning;Community Activity;Laundry;Occupation;Yard  Work    [employer]    PT Frequency 3x / week    PT Duration 8 weeks    PT Treatment/Interventions ADLs/Self Care Home Management;Biofeedback;Gait training;Stair training;Functional mobility training;Therapeutic activities;Therapeutic exercise;Balance training;Neuromuscular re-education;Patient/family education;Manual techniques;Manual lymph drainage;Scar mobilization;Passive range of motion;Energy conservation;Taping;Vasopneumatic Device;Joint Manipulations    PT Next Visit Plan Review HEP specifically seated activities; continue with nustep for continuous movement; add bike for ROM focus; ; manual tx as needed    Consulted and Agree with Plan of Care Patient                3:07 PM, 04/26/21 Carrie [name] MPT [facility] [place] [phone] [phone]

## 2021-04-28 ENCOUNTER — Ambulatory Visit (INDEPENDENT_AMBULATORY_CARE_PROVIDER_SITE_OTHER): Payer: BC Managed Care – PPO

## 2021-04-28 ENCOUNTER — Ambulatory Visit (HOSPITAL_COMMUNITY): Payer: BC Managed Care – PPO

## 2021-04-28 ENCOUNTER — Ambulatory Visit (INDEPENDENT_AMBULATORY_CARE_PROVIDER_SITE_OTHER): Payer: BC Managed Care – PPO | Admitting: Physician Assistant

## 2021-04-28 ENCOUNTER — Encounter: Payer: Self-pay | Admitting: Physician Assistant

## 2021-04-28 ENCOUNTER — Other Ambulatory Visit: Payer: Self-pay

## 2021-04-28 DIAGNOSIS — M25562 Pain in left knee: Secondary | ICD-10-CM

## 2021-04-28 DIAGNOSIS — G8929 Other chronic pain: Secondary | ICD-10-CM

## 2021-04-28 DIAGNOSIS — M25661 Stiffness of right knee, not elsewhere classified: Secondary | ICD-10-CM | POA: Diagnosis not present

## 2021-04-28 DIAGNOSIS — R262 Difficulty in walking, not elsewhere classified: Secondary | ICD-10-CM | POA: Diagnosis not present

## 2021-04-28 DIAGNOSIS — Z96651 Presence of right artificial knee joint: Secondary | ICD-10-CM | POA: Diagnosis not present

## 2021-04-28 DIAGNOSIS — M25561 Pain in right knee: Secondary | ICD-10-CM | POA: Diagnosis not present

## 2021-04-28 DIAGNOSIS — M1711 Unilateral primary osteoarthritis, right knee: Secondary | ICD-10-CM | POA: Diagnosis not present

## 2021-04-28 MED ORDER — LIDOCAINE HCL 1 % IJ SOLN
3.0000 mL | INTRAMUSCULAR | Status: AC | PRN
  Administered 2021-04-28: 3 mL

## 2021-04-28 MED ORDER — METHYLPREDNISOLONE ACETATE 40 MG/ML IJ SUSP
40.0000 mg | INTRAMUSCULAR | Status: AC | PRN
  Administered 2021-04-28: 40 mg via INTRA_ARTICULAR

## 2021-04-28 NOTE — Therapy (Signed)
OUTPATIENT PHYSICAL THERAPY TREATMENT NOTE   Patient Name: Carrie [name] MRN: [id number] DOB:1971-01-21, 51 y.o., female Today's Date: 04/28/2021  PCP: Ladon Applebaum REFERRING PROVIDER: Magnus Ivan   PT End of Session - 04/28/21 1440     Visit Number 5    Number of Visits 24    Date for PT Re-Evaluation 06/19/21    Authorization Type BCBS no auth needed    PT Start Time 1433    PT Stop Time 1515    PT Time Calculation (min) 42 min    Activity Tolerance Patient tolerated treatment well;No increased pain    Behavior During Therapy WFL for tasks assessed/performed               Past Medical History:  Diagnosis Date   Arthritis    Essential hypertension    GERD (gastroesophageal reflux disease)    Mixed hyperlipidemia    PONV (postoperative nausea and vomiting)    Past Surgical History:  Procedure Laterality Date   ABDOMINAL HYSTERECTOMY     HARDWARE REMOVAL Right 04/02/2021   Procedure: Right Knee HARDWARE REMOVAL;  Surgeon: Kathryne Hitch, MD;  Location: [facility];  Service: Orthopedics;  Laterality: Right;   KNEE ARTHROSCOPY  2009   MASS EXCISION  02/11/2011   Procedure: EXCISION MASS;  Surgeon: Kerrin Champagne, MD;  Location: [facility];  Service: Orthopedics;  Laterality: Right;  resection of neuroma right infrapatellar medial knee   PATELLA ARTHROPLASTY     rt knee-   TOTAL KNEE ARTHROPLASTY  2011   partial-[place] reg    TOTAL KNEE ARTHROPLASTY Right 04/02/2021   Procedure: Right TOTAL KNEE ARTHROPLASTY;  Surgeon: Kathryne Hitch, MD;  Location: [facility];  Service: Orthopedics;  Laterality: Right;   TUBAL LIGATION     Patient Active Problem List   Diagnosis Date Noted   Pain in right knee 04/19/2021   Stiffness of right knee, not elsewhere classified 04/19/2021   Difficulty in walking, not elsewhere classified 04/19/2021   Status post total knee replacement, right 04/02/2021   Allergic rhinitis due to pollen 06/22/2020    Eosinophilic esophagitis 06/22/2020   Rash and other nonspecific skin eruption 06/22/2020   Unilateral primary osteoarthritis, right knee 10/09/2017   AC joint arthropathy 01/04/2017   Acute pain of right shoulder 11/15/2016   Other derangements of patella, unspecified knee 09/02/2015   Recurrent dislocation of right patella 09/02/2015   Breast hypertrophy in female 05/13/2015   Neuroma of lower extremity 02/11/2011    Class: Chronic    REFERRING DIAG: Right TKA  04/02/21 surgery date  THERAPY DIAG:  Stiffness of right knee, not elsewhere classified  Status post total knee replacement, right  Chronic pain of right knee  Difficulty in walking, not elsewhere classified  PERTINENT HISTORY: Prior right partial replacement 12+ years ago  PRECAUTIONS: none  SUBJECTIVE: Patient had an appointment with Dr. Doroteo Glassman PA today and he drew fluid of her left non surgical knee and gave her a cortisone injections with instructions to take it easy on the Left knee for the next couple of days.  PAIN:  Are you having pain? Yes Are you having pain? Yes: NPRS scale: 5/10 Pain location: Right knee posterior and med/lateral ; L knee soreness Pain description: throbs, aches Aggravating factors: movement Relieving factors: none recently          TODAY'S TREATMENT:   Seated heel slides x 10 Seated heel slides with overpressure x 10 Supine knee extension hang  x 5 min; towel roll under heel Manual STW to  Right knee, especially medial hamstrings x 10 min; no other treatment performed during manual treatment PROM Right  knee extension x 8 reps AAROM R knee flexion on table x 10 reps; AAROM R knee flexion 85 deg today   *held standing exercise today secondary to cortisone injection Left knee         PATIENT EDUCATION: Education details: discussed ice for pain management and swelling Person educated: Patient Education method: Explanation and Demonstration Education  comprehension: verbalized understanding HOME EXERCISE PROGRAM: Access Code: [email or web address] URL: [email or web address] Date: 04/19/2021 Prepared by: Lonzo Cloud   Exercises Supine Heel Slides - 3-5 x daily - 7 x weekly - 2 sets - 20 reps Supine Single Leg Ankle Pumps - 3-5 x daily - 7 x weekly - 2 sets - 20 reps Supine Quad Set (Mirrored) - 3 x daily - 7 x weekly - 2 sets - 20 reps Supine Short Arc Quad (Mirrored) - 3 x daily - 7 x weekly - 2 sets - 10 reps Supine Active Straight Leg Raise - 3 x daily - 7 x weekly - 2 sets - 10 reps Supine Hip Abduction - 3 x daily - 7 x weekly - 2 sets - 20 reps Seated Knee Flexion AAROM - 3 x daily - 7 x weekly - 2 sets - 10 reps - 5 seconds hold Seated Long Arc Quad - 3 x daily - 7 x weekly - 2 sets - 10 reps  Access Code: [email or web address] URL: [email or web address] Date: 04/26/2021 Prepared by: [facility] - Re[facility]  Exercises Standing Gastroc Stretch on Step - 2 x daily - 7 x weekly - 5 sets - 1 reps - 20 hold Standing Knee Flexion Stretch on Step - 2 x daily - 7 x weekly - 2 sets - 10 reps Standing Hamstring Stretch with Step - 2 x daily - 7 x weekly - 1 sets - 5 reps - 20 hold    PT Short Term Goals -       PT SHORT TERM GOAL #1   Title Patient will be able to improve right knee AROM to 0 degrees extension to improve gait mechanics and standing safety.    Baseline 04/19/21: -14 deg extension AROM    Time 1    Period Months    Status New    Target Date 05/20/21      PT SHORT TERM GOAL #2   Title Patient will be able to improve right knee flexion AROM to 80 degrees to improve function.    Baseline 04/26/21 AAROM R knee 85 degrees in sitting today.    Time 1    Period Months    Status New    Target Date 05/20/21      PT SHORT TERM GOAL #3   Title Patient will be able to ambulate without use of front wheel walker throughout home for improved ADLs.    Baseline 04/19/21: Patient currently using front wheel walker consistently     Time 1    Period Months    Status New    Target Date 05/20/21              PT Long Term Goals       PT LONG TERM GOAL #1   Title Patient will be able to get to 0 degrees extension and at least 110 degrees flexion for right knee AROM for proper mechanics.    Baseline 04/19/21: -14 deg extension  and 62 degrees flexion end of session AROM    Time 2    Period Months    Status New    Target Date 06/19/21      PT LONG TERM GOAL #2   Title Patient will be able to ambulate in community over 30 minutes without assistive devices and with good form.    Baseline 04/19/21: using front wheeled walker at home and community    Time 2    Period Months    Status New    Target Date 06/19/21      PT LONG TERM GOAL #3   Title Patient will improve FOTO score to at least 69 degrees to show improved functional abilities    Baseline 04/19/21: 55 today    Time 2    Period Months    Status New    Target Date 06/19/21              Plan -     Clinical Impression Statement Today's session  focused on mobility of Right  knee.  She is limited today due to Left non surgical knee cortison injection and fluid removal with MD instructions to take it easy a couple days.   Patient is making progress with R knee AAROM sitting flexion to 85 degrees today but she continues significant Right knee extension lag.  Patient is a good candidate to continue with skilled physical therapy to progress current needs of post operative recovery while improving functional status as able.    Personal Factors and Comorbidities Past/Current Experience;Time since onset of injury/illness/exacerbation    Examination-Activity Limitations Locomotion Level;Carry;Dressing;Hygiene/Grooming;Lift;Stand;Squat;Stairs    Examination-Participation Restrictions Cleaning;Community Activity;Laundry;Occupation;Yard Work    [employer]    PT Frequency 3x / week    PT Duration 8 weeks    PT Treatment/Interventions ADLs/Self Care Home  Management;Biofeedback;Gait training;Stair training;Functional mobility training;Therapeutic activities;Therapeutic exercise;Balance training;Neuromuscular re-education;Patient/family education;Manual techniques;Manual lymph drainage;Scar mobilization;Passive range of motion;Energy conservation;Taping;Vasopneumatic Device;Joint Manipulations    PT Next Visit Plan Resume standing exercise, Nustep   Consulted and Agree with Plan of Care Patient                3:30 PM, 04/28/21 Dorethia Jeanmarie Small Ezrah Dembeck MPT [facility] [place] [phone]

## 2021-04-28 NOTE — Progress Notes (Signed)
? ?Office Visit Note ?  ?Patient: Carrie [name]           ?Date of Birth: [birth year]           ?MRN: [id number] ?Visit Date: 04/28/2021 ?             ?Requested by: Jake Samples, PA-C ?[address] ?[place],  [place] [address] ?PCP: Jake Samples, PA-C ? ? ?Assessment & Plan: ?Visit Diagnoses:  ?1. Acute pain of left knee   ? ? ?Plan: We will have her remove the Ace bandage that was applied after the injection the day before presenting in bed tonight.  She will begin working on quad strengthening exercises as discussed on Friday.  We will see her back at her regular scheduled postop appointment for the right knee and reevaluate left knee at that time.  She will continue physical therapy for the right knee.  Questions were encouraged and answered at length. ? ?Follow-Up Instructions: Return in about 2 weeks (around 05/12/2021).  ? ?Orders:  ?Orders Placed This Encounter  ?Procedures  ? Large Joint Inj  ? XR Knee 1-2 Views Left  ? ?No orders of the defined types were placed in this encounter. ? ? ? ? Procedures: ?Large Joint Inj: L knee on 04/28/2021 9:45 AM ?Indications: pain ?Details: 22 G 1.5 in needle, anterolateral approach ? ?Arthrogram: No ? ?Medications: 3 mL lidocaine 1 %; 40 mg methylPREDNISolone acetate 40 MG/ML ?Aspirate: 1 mL yellow ?Outcome: tolerated well, no immediate complications ?Procedure, treatment alternatives, risks and benefits explained, specific risks discussed. Consent was given by the patient. Immediately prior to procedure a time out was called to verify the correct patient, procedure, equipment, support staff and site/side marked as required. Patient was prepped and draped in the usual sterile fashion.  ? ? ? ? ?Clinical Data: ?No additional findings. ? ? ?Subjective: ?Chief Complaint  ?Patient presents with  ? Left Knee - Pain  ? ? ?HPI ?Carrie [name] returns today due to left knee pain.  She states it started really this past Friday she was having some discomfort in the knee prior  to her right knee surgery.  Right knee overall is improving.  She is working with therapy.  She states the left knee is giving out 3 times.  No known injury.  She is using a walker.  She states rest helps some.  She notes pain in the posterior aspect of the knee. ?Review of Systems ?Negative for fevers chills.  Please see HPI otherwise negative or noncontributory. ? ?Objective: ?Vital Signs: There were no vitals taken for this visit. ? ?Physical Exam ?Constitutional:   ?   Appearance: She is not ill-appearing or diaphoretic.  ?Pulmonary:  ?   Effort: Pulmonary effort is normal.  ?Neurological:  ?   Mental Status: She is alert and oriented to person, place, and time.  ? ? ?Ortho Exam ?Bilateral knees right knee lacks full extension by approximately 5 degrees flexion to approximately 85 degrees.  Surgical incisions healing well no signs of infection.  Left knee full extension slight hyperextension in fact.  Flexion to approximate 110 degrees.  Passive range of motion reveals patellofemoral crepitus.  Slight tenderness along medial joint line.  Plus minus effusion no abnormal warmth erythema.  Calf supple nontender.  No instability valgus varus stressing anterior drawer is negative on the left knee.  McMurray's is negative left knee.  Patella tracks laterally. ?Specialty Comments:  ?No specialty comments available. ? ?Imaging: ?XR Knee 1-2 Views  Left ? ?Result Date: 04/28/2021 ?Left knee 2 views:   Mild narrowing medial lateral joint line.  Patella tracks laterally.  Mild patellofemoral arthritic changes.  No acute fractures bony abnormalities.  ? ? ?PMFS History: ?Patient Active Problem List  ? Diagnosis Date Noted  ? Pain in right knee 04/19/2021  ? Stiffness of right knee, not elsewhere classified 04/19/2021  ? Difficulty in walking, not elsewhere classified 04/19/2021  ? Status post total knee replacement, right 04/02/2021  ? Allergic rhinitis due to pollen 06/22/2020  ? Eosinophilic esophagitis 28/36/6294  ? Rash  and other nonspecific skin eruption 06/22/2020  ? Unilateral primary osteoarthritis, right knee 10/09/2017  ? AC joint arthropathy 01/04/2017  ? Acute pain of right shoulder 11/15/2016  ? Other derangements of patella, unspecified knee 09/02/2015  ? Recurrent dislocation of right patella 09/02/2015  ? Breast hypertrophy in female 05/13/2015  ? Neuroma of lower extremity 02/11/2011  ?  Class: Chronic  ? ?Past Medical History:  ?Diagnosis Date  ? Arthritis   ? Essential hypertension   ? GERD (gastroesophageal reflux disease)   ? Mixed hyperlipidemia   ? PONV (postoperative nausea and vomiting)   ?  ?Family History  ?Problem Relation Age of Onset  ? Breast cancer Mother   ? Heart disease Father   ? Prostate cancer Father   ?  ?Past Surgical History:  ?Procedure Laterality Date  ? ABDOMINAL HYSTERECTOMY    ? HARDWARE REMOVAL Right 04/02/2021  ? Procedure: Right Knee HARDWARE REMOVAL;  Surgeon: Mcarthur Rossetti, MD;  Location: [facility];  Service: Orthopedics;  Laterality: Right;  ? KNEE ARTHROSCOPY  2009  ? MASS EXCISION  02/11/2011  ? Procedure: EXCISION MASS;  Surgeon: Jessy Oto, MD;  Location: [place];  Service: Orthopedics;  Laterality: Right;  resection of neuroma right infrapatellar medial knee  ? PATELLA ARTHROPLASTY    ? rt knee-  ? TOTAL KNEE ARTHROPLASTY  2011  ? partial-[place] reg   ? TOTAL KNEE ARTHROPLASTY Right 04/02/2021  ? Procedure: Right TOTAL KNEE ARTHROPLASTY;  Surgeon: Mcarthur Rossetti, MD;  Location: [facility];  Service: Orthopedics;  Laterality: Right;  ? TUBAL LIGATION    ? ?Social History  ? ?Occupational History  ? Not on file  ?Tobacco Use  ? Smoking status: Never  ? Smokeless tobacco: Never  ?Vaping Use  ? Vaping Use: Never used  ?Substance and Sexual Activity  ? Alcohol use: No  ?  Alcohol/week: 0.0 standard drinks  ? Drug use: No  ? Sexual activity: Yes  ?  Birth control/protection: Surgical  ?  Comment: hyst  ? ? ? ? ? ? ?

## 2021-04-30 ENCOUNTER — Ambulatory Visit (HOSPITAL_COMMUNITY): Payer: BC Managed Care – PPO

## 2021-04-30 ENCOUNTER — Other Ambulatory Visit: Payer: Self-pay

## 2021-04-30 ENCOUNTER — Encounter (HOSPITAL_COMMUNITY): Payer: Self-pay

## 2021-04-30 DIAGNOSIS — M25661 Stiffness of right knee, not elsewhere classified: Secondary | ICD-10-CM

## 2021-04-30 DIAGNOSIS — R262 Difficulty in walking, not elsewhere classified: Secondary | ICD-10-CM | POA: Diagnosis not present

## 2021-04-30 DIAGNOSIS — G8929 Other chronic pain: Secondary | ICD-10-CM | POA: Diagnosis not present

## 2021-04-30 DIAGNOSIS — Z96651 Presence of right artificial knee joint: Secondary | ICD-10-CM | POA: Diagnosis not present

## 2021-04-30 DIAGNOSIS — M25561 Pain in right knee: Secondary | ICD-10-CM | POA: Diagnosis not present

## 2021-04-30 DIAGNOSIS — M1711 Unilateral primary osteoarthritis, right knee: Secondary | ICD-10-CM

## 2021-04-30 NOTE — Therapy (Signed)
?OUTPATIENT PHYSICAL THERAPY TREATMENT NOTE ? ? ?Patient Name: Carrie [name] ?MRN: [id number] ?DOB:August 05, 1970, 51 y.o., female ?Today's Date: 04/30/2021 ? ?PCP: Jake Samples, PA-C ?REFERRING PROVIDER: Ninfa Linden ? ? PT End of Session - 04/30/21 1152   ? ? Visit Number 6   ? Number of Visits 24   ? Date for PT Re-Evaluation 06/19/21   ? Authorization Type BCBS no auth needed   ? PT Start Time 1115   ? PT Stop Time 6269   ? PT Time Calculation (min) 43 min   ? Activity Tolerance Patient tolerated treatment well;No increased pain   ? Behavior During Therapy [facility] for tasks assessed/performed   ? ?  ?  ? ?  ? ? ? ? ? ?Past Medical History:  ?Diagnosis Date  ? Arthritis   ? Essential hypertension   ? GERD (gastroesophageal reflux disease)   ? Mixed hyperlipidemia   ? PONV (postoperative nausea and vomiting)   ? ?Past Surgical History:  ?Procedure Laterality Date  ? ABDOMINAL HYSTERECTOMY    ? HARDWARE REMOVAL Right 04/02/2021  ? Procedure: Right Knee HARDWARE REMOVAL;  Surgeon: Mcarthur Rossetti, MD;  Location: [facility];  Service: Orthopedics;  Laterality: Right;  ? KNEE ARTHROSCOPY  2009  ? MASS EXCISION  02/11/2011  ? Procedure: EXCISION MASS;  Surgeon: Jessy Oto, MD;  Location: [place];  Service: Orthopedics;  Laterality: Right;  resection of neuroma right infrapatellar medial knee  ? PATELLA ARTHROPLASTY    ? rt knee-  ? TOTAL KNEE ARTHROPLASTY  2011  ? partial-[place] reg   ? TOTAL KNEE ARTHROPLASTY Right 04/02/2021  ? Procedure: Right TOTAL KNEE ARTHROPLASTY;  Surgeon: Mcarthur Rossetti, MD;  Location: [facility];  Service: Orthopedics;  Laterality: Right;  ? TUBAL LIGATION    ? ?Patient Active Problem List  ? Diagnosis Date Noted  ? Pain in right knee 04/19/2021  ? Stiffness of right knee, not elsewhere classified 04/19/2021  ? Difficulty in walking, not elsewhere classified 04/19/2021  ? Status post total knee replacement, right 04/02/2021  ? Allergic rhinitis due to pollen  06/22/2020  ? Eosinophilic esophagitis 48/54/6270  ? Rash and other nonspecific skin eruption 06/22/2020  ? Unilateral primary osteoarthritis, right knee 10/09/2017  ? AC joint arthropathy 01/04/2017  ? Acute pain of right shoulder 11/15/2016  ? Other derangements of patella, unspecified knee 09/02/2015  ? Recurrent dislocation of right patella 09/02/2015  ? Breast hypertrophy in female 05/13/2015  ? Neuroma of lower extremity 02/11/2011  ?  Class: Chronic  ? ? ?REFERRING DIAG: Right TKA  04/02/21 surgery date ? ?THERAPY DIAG:  ?No diagnosis found. ? ?PERTINENT HISTORY: Prior right partial replacement 12+ years ago ? ?PRECAUTIONS: none ? ?SUBJECTIVE: Patient feeling a bit better overall, ready to do more after a few days of rest for the left non surgical knee to do more. Yesterday she felt like she moved well except each morning "feels like square one".  MD gave a 100 degree goal for next visit in flexion.  ? ?PAIN:  ?Are you having pain? Yes ?Are you having pain? Yes: NPRS scale: 2/10 ?Pain location: Right knee posterior and med/lateral  ?Pain description: throbs, aches ?Aggravating factors: movement ?Relieving factors: none recently ?  ?  ? ? ?TODAY'S TREATMENT:  ?04/30/21:  ?Supine SAQ into towel roll B for overflow cueing into right quad 2 x10  ?Supine heel slides x 10 with gentle overpressure ?Supine knee extension hang x 5 min; towel roll under  heel ?Manual STW to  Right knee, especially medial hamstrings x 10 min; no other treatment performed during manual treatment ?PROM Right knee extension x 8 reps ?AAROM R knee flexion on table x 10 reps; AAROM R knee flexion 79 deg today ?Supine modified thomas stretch for knee flexion and hip opening x 60 seconds ?Supine straight leg raise hamstring stretch for extension 2 x 30 seconds ?Seated knee swings gentle at table top for relaxed muscle cueing x 60 seconds ?Seated heel slides x 10 ?NuStep 8 mins level 1 seat 8  ? ?Knee PROM end of session -10degrees extension to  flexion of 79 degrees ? ? ?3/15 ?Seated heel slides x 10 ?Seated heel slides with overpressure x 10 ?Supine knee extension hang x 5 min; towel roll under heel ?Manual STW to  Right knee, especially medial hamstrings x 10 min; no other treatment performed during manual treatment ?PROM Right  knee extension x 8 reps ?AAROM R knee flexion on table x 10 reps; AAROM R knee flexion 85 deg today ? ? ? ? ?PATIENT EDUCATION: ?Education details: discussed ice for pain management and swelling only after activity suggested today, no ice late at night, and more movement throughout evening before bed  ?Person educated: Patient ?Education method: Explanation and Demonstration ?Education comprehension: verbalized understanding ? ?HOME EXERCISE PROGRAM: ?Access Code: [email or web address] ?URL: [email or web address] ?Date: 04/19/2021 ?Prepared by: Jerilynn Som ?  ?Exercises ?Supine Heel Slides - 3-5 x daily - 7 x weekly - 2 sets - 20 reps ?Supine Single Leg Ankle Pumps - 3-5 x daily - 7 x weekly - 2 sets - 20 reps ?Supine Quad Set (Mirrored) - 3 x daily - 7 x weekly - 2 sets - 20 reps ?Supine Short Arc Quad (Mirrored) - 3 x daily - 7 x weekly - 2 sets - 10 reps ?Supine Active Straight Leg Raise - 3 x daily - 7 x weekly - 2 sets - 10 reps ?Supine Hip Abduction - 3 x daily - 7 x weekly - 2 sets - 20 reps ?Seated Knee Flexion AAROM - 3 x daily - 7 x weekly - 2 sets - 10 reps - 5 seconds hold ?Seated Long Arc Quad - 3 x daily - 7 x weekly - 2 sets - 10 reps ? ?Access Code: [email or web address] ?URL: [email or web address] ?Date: 04/26/2021 ?Prepared by: [facility] - Rehab ? ?Exercises ?Standing Gastroc Stretch on Step - 2 x daily - 7 x weekly - 5 sets - 1 reps - 20 hold ?Standing Knee Flexion Stretch on Step - 2 x daily - 7 x weekly - 2 sets - 10 reps ?Standing Hamstring Stretch with Step - 2 x daily - 7 x weekly - 1 sets - 5 reps - 20 hold ? ? ? PT Short Term Goals -   ? ?  ? PT SHORT TERM GOAL #1  ? Title Patient will be able to improve  right knee AROM to 0 degrees extension to improve gait mechanics and standing safety.   ? Baseline 04/19/21: -14 deg extension AROM   ? Time 1   ? Period Months   ? Status New   ? Target Date 05/20/21   ?  ? PT SHORT TERM GOAL #2  ? Title Patient will be able to improve right knee flexion AROM to 80 degrees to improve function.   ? Baseline 04/26/21 AAROM R knee 85 degrees in sitting today.   ? Time 1   ? Period Months   ? Status New   ?  Target Date 05/20/21   ?  ? PT SHORT TERM GOAL #3  ? Title Patient will be able to ambulate without use of front wheel walker throughout home for improved ADLs.   ? Baseline 04/19/21: Patient currently using front wheel walker consistently   ? Time 1   ? Period Months   ? Status New   ? Target Date 05/20/21   ? ?  ?  ? ?  ? ? ? PT Long Term Goals   ? ?  ? PT LONG TERM GOAL #1  ? Title Patient will be able to get to 0 degrees extension and at least 110 degrees flexion for right knee AROM for proper mechanics.   ? Baseline 04/19/21: -14 deg extension and 62 degrees flexion end of session AROM   ? Time 2   ? Period Months   ? Status New   ? Target Date 06/19/21   ?  ? PT LONG TERM GOAL #2  ? Title Patient will be able to ambulate in community over 30 minutes without assistive devices and with good form.   ? Baseline 04/19/21: using front wheeled walker at home and community   ? Time 2   ? Period Months   ? Status New   ? Target Date 06/19/21   ?  ? PT LONG TERM GOAL #3  ? Title Patient will improve FOTO score to at least 69 degrees to show improved functional abilities   ? Baseline 04/19/21: 55 today   ? Time 2   ? Period Months   ? Status New   ? Target Date 06/19/21   ? ?  ?  ? ?  ? ? ? Plan -   ? ? Clinical Impression Statement Today's session  focused on continued right knee post operative care for ROM primarily today.  Non-surgical side improving and may be ready for paused standing activities next session to best progress functional skills.  Continues to demonstrate knee extension limited  in ROM that need primary focus as knee flexion continues to improve with decreased guarding.   Patient is a good candidate to continue with skilled physical therapy to progress current needs of post operative reco

## 2021-05-03 ENCOUNTER — Ambulatory Visit (HOSPITAL_COMMUNITY): Payer: BC Managed Care – PPO

## 2021-05-03 ENCOUNTER — Other Ambulatory Visit: Payer: Self-pay

## 2021-05-03 DIAGNOSIS — G8929 Other chronic pain: Secondary | ICD-10-CM

## 2021-05-03 DIAGNOSIS — R262 Difficulty in walking, not elsewhere classified: Secondary | ICD-10-CM | POA: Diagnosis not present

## 2021-05-03 DIAGNOSIS — M25661 Stiffness of right knee, not elsewhere classified: Secondary | ICD-10-CM | POA: Diagnosis not present

## 2021-05-03 DIAGNOSIS — M1711 Unilateral primary osteoarthritis, right knee: Secondary | ICD-10-CM | POA: Diagnosis not present

## 2021-05-03 DIAGNOSIS — Z96651 Presence of right artificial knee joint: Secondary | ICD-10-CM | POA: Diagnosis not present

## 2021-05-03 DIAGNOSIS — M25561 Pain in right knee: Secondary | ICD-10-CM | POA: Diagnosis not present

## 2021-05-03 NOTE — Therapy (Addendum)
OUTPATIENT PHYSICAL THERAPY TREATMENT NOTE   Patient Name: ANALAYAH LAMKINS MRN: [id number] DOB:January 05, 1971, 51 y.o., female Today's Date: 05/03/2021  PCP: Ladon Applebaum REFERRING PROVIDER: Magnus Ivan   PT End of Session - 05/03/21 1254     Visit Number 7    Number of Visits 24    Date for PT Re-Evaluation 06/19/21    Authorization Type BCBS no auth needed    PT Start Time 1255    PT Stop Time 1347    PT Time Calculation (min) 52 min    Activity Tolerance Patient tolerated treatment well;No increased pain    Behavior During Therapy WFL for tasks assessed/performed                 Past Medical History:  Diagnosis Date   Arthritis    Essential hypertension    GERD (gastroesophageal reflux disease)    Mixed hyperlipidemia    PONV (postoperative nausea and vomiting)    Past Surgical History:  Procedure Laterality Date   ABDOMINAL HYSTERECTOMY     HARDWARE REMOVAL Right 04/02/2021   Procedure: Right Knee HARDWARE REMOVAL;  Surgeon: Kathryne Hitch, MD;  Location: [facility];  Service: Orthopedics;  Laterality: Right;   KNEE ARTHROSCOPY  2009   MASS EXCISION  02/11/2011   Procedure: EXCISION MASS;  Surgeon: Kerrin Champagne, MD;  Location: [facility];  Service: Orthopedics;  Laterality: Right;  resection of neuroma right infrapatellar medial knee   PATELLA ARTHROPLASTY     rt knee-   TOTAL KNEE ARTHROPLASTY  2011   partial-[place] reg    TOTAL KNEE ARTHROPLASTY Right 04/02/2021   Procedure: Right TOTAL KNEE ARTHROPLASTY;  Surgeon: Kathryne Hitch, MD;  Location: [facility];  Service: Orthopedics;  Laterality: Right;   TUBAL LIGATION     Patient Active Problem List   Diagnosis Date Noted   Pain in right knee 04/19/2021   Stiffness of right knee, not elsewhere classified 04/19/2021   Difficulty in walking, not elsewhere classified 04/19/2021   Status post total knee replacement, right 04/02/2021   Allergic rhinitis due to pollen  06/22/2020   Eosinophilic esophagitis 06/22/2020   Rash and other nonspecific skin eruption 06/22/2020   Unilateral primary osteoarthritis, right knee 10/09/2017   AC joint arthropathy 01/04/2017   Acute pain of right shoulder 11/15/2016   Other derangements of patella, unspecified knee 09/02/2015   Recurrent dislocation of right patella 09/02/2015   Breast hypertrophy in female 05/13/2015   Neuroma of lower extremity 02/11/2011    Class: Chronic    REFERRING DIAG: Right TKA  04/02/21 surgery date  THERAPY DIAG:  Unilateral primary osteoarthritis, right knee  Stiffness of right knee, not elsewhere classified  Status post total knee replacement, right  Difficulty in walking, not elsewhere classified  Chronic pain of right knee  PERTINENT HISTORY: Prior right partial replacement 12+ years ago  PRECAUTIONS: none  SUBJECTIVE: Patient feeling a bit better overall,Patient reports she is walking in her home without her RW and comes into therapy today without an AD.  Patient does have a cane at home.  PAIN:  Are you having pain? Yes Are you having pain? Yes: NPRS scale: 2-3/10 Pain location: Right knee posterior and med/lateral  Pain description: throbs, aches Aggravating factors: movement Relieving factors: none recently       TODAY'S TREATMENT:  05/03/21 Supine heel slides x 10 with gentle overpressure Manual STW to  Right knee, especially medial hamstrings x 10 min; no other treatment  performed during manual treatment PROM Right knee extension x 8 reps AAROM R knee flexion on table x 10 reps  Sitting   hamstring stretch with strap for extension 2 x 30 seconds Seated knee swings gentle at table top for relaxed muscle cueing x 60 seconds Seated heel slides x 10 NuStep 8 mins level 1 seat 8  Heel raises x 20 Gastroc stretch 5 x 20 sec Lunge on Right knee for knee flexion x 10  Shallow squat x 10 Prone extension hang x 1 min  Bike half revolutions; seat 6 x 5 min  Knee  PROM end of session -14 supine knee extension,          04/30/21:  Supine SAQ into towel roll B for overflow cueing into right quad 2 x10  Supine heel slides x 10 with gentle overpressure Supine knee extension hang x 5 min; towel roll under heel Manual STW to  Right knee, especially medial hamstrings x 10 min; no other treatment performed during manual treatment PROM Right knee extension x 8 reps AAROM R knee flexion on table x 10 reps; AAROM R knee flexion 79 deg today Supine modified thomas stretch for knee flexion and hip opening x 60 seconds Supine straight leg raise hamstring stretch for extension 2 x 30 seconds Seated knee swings gentle at table top for relaxed muscle cueing x 60 seconds Seated heel slides x 10 NuStep 8 mins level 1 seat 8   Knee PROM end of session -10degrees extension to flexion of 79 degrees   3/15 Seated heel slides x 10 Seated heel slides with overpressure x 10 Supine knee extension hang x 5 min; towel roll under heel Manual STW to  Right knee, especially medial hamstrings x 10 min; no other treatment performed during manual treatment PROM Right  knee extension x 8 reps AAROM R knee flexion on table x 10 reps; AAROM R knee flexion 85 deg today     PATIENT EDUCATION: Education details: discussed ice for pain management and swelling only after activity suggested today, no ice late at night, and more movement throughout evening before bed  Person educated: Patient Education method: Explanation and Demonstration Education comprehension: verbalized understanding  HOME EXERCISE PROGRAM: Access Code: [email or web address] URL: [email or web address] Date: 05/03/2021 Prepared by: [facility] - Rehab  Exercises Prone Knee Extension Hang - 3 x daily - 7 x weekly - 1 sets - 1 reps - 1-3 min hold   Access Code: [email or web address] URL: [email or web address] Date: 04/19/2021 Prepared by: Lonzo Cloud   Exercises Supine Heel Slides - 3-5 x daily - 7  x weekly - 2 sets - 20 reps Supine Single Leg Ankle Pumps - 3-5 x daily - 7 x weekly - 2 sets - 20 reps Supine Quad Set (Mirrored) - 3 x daily - 7 x weekly - 2 sets - 20 reps Supine Short Arc Quad (Mirrored) - 3 x daily - 7 x weekly - 2 sets - 10 reps Supine Active Straight Leg Raise - 3 x daily - 7 x weekly - 2 sets - 10 reps Supine Hip Abduction - 3 x daily - 7 x weekly - 2 sets - 20 reps Seated Knee Flexion AAROM - 3 x daily - 7 x weekly - 2 sets - 10 reps - 5 seconds hold Seated Long Arc Quad - 3 x daily - 7 x weekly - 2 sets - 10 reps  Access Code: [email or web address] URL: [email or web address] Date: 04/26/2021 Prepared by: [facility] - Rehab  Exercises Standing Gastroc  Stretch on Step - 2 x daily - 7 x weekly - 5 sets - 1 reps - 20 hold Standing Knee Flexion Stretch on Step - 2 x daily - 7 x weekly - 2 sets - 10 reps Standing Hamstring Stretch with Step - 2 x daily - 7 x weekly - 1 sets - 5 reps - 20 hold    PT Short Term Goals -       PT SHORT TERM GOAL #1   Title Patient will be able to improve right knee AROM to 0 degrees extension to improve gait mechanics and standing safety.    Baseline 04/19/21: -14 deg extension AROM    Time 1    Period Months    Status New    Target Date 05/20/21      PT SHORT TERM GOAL #2   Title Patient will be able to improve right knee flexion AROM to 80 degrees to improve function.    Baseline 04/26/21 AAROM R knee 85 degrees in sitting today.    Time 1    Period Months    Status New    Target Date 05/20/21      PT SHORT TERM GOAL #3   Title Patient will be able to ambulate without use of front wheel walker throughout home for improved ADLs.    Baseline 04/19/21: Patient currently using front wheel walker consistently    Time 1    Period Months    Status New    Target Date 05/20/21              PT Long Term Goals       PT LONG TERM GOAL #1   Title Patient will be able to get to 0 degrees extension and at least 110 degrees flexion for  right knee AROM for proper mechanics.    Baseline 04/19/21: -14 deg extension and 62 degrees flexion end of session AROM    Time 2    Period Months    Status New    Target Date 06/19/21      PT LONG TERM GOAL #2   Title Patient will be able to ambulate in community over 30 minutes without assistive devices and with good form.    Baseline 04/19/21: using front wheeled walker at home and community    Time 2    Period Months    Status New    Target Date 06/19/21      PT LONG TERM GOAL #3   Title Patient will improve FOTO score to at least 69 degrees to show improved functional abilities    Baseline 04/19/21: 55 today    Time 2    Period Months    Status New    Target Date 06/19/21              Plan -     Clinical Impression Statement Today's session Patient continues with limited mobility and strength R knee but demonstrates less need for AD today. Therapist added prone knee extension hang and bike to work on knee mobility limitations.   Patient is a good candidate to continue with skilled physical therapy to progress current needs of post operative recovery while improving functional status as able.    Personal Factors and Comorbidities Past/Current Experience;Time since onset of injury/illness/exacerbation    Examination-Activity Limitations Locomotion Level;Carry;Dressing;Hygiene/Grooming;Lift;Stand;Squat;Stairs    Examination-Participation Restrictions Cleaning;Community Activity;Laundry;Occupation;Yard Work    [employer]    PT Frequency 3x / week    PT  Duration 8 weeks    PT Treatment/Interventions ADLs/Self Care Home Management;Biofeedback;Gait training;Stair training;Functional mobility training;Therapeutic activities;Therapeutic exercise;Balance training;Neuromuscular re-education;Patient/family education;Manual techniques;Manual lymph drainage;Scar mobilization;Passive range of motion;Energy conservation;Taping;Vasopneumatic Device;Joint Manipulations    PT Next Visit  Plan Add TKE's   Consulted and Agree with Plan of Care Patient                2:57 PM, 05/03/21 Valli Randol Small Shantese Raven MPT [facility] [place] [phone]

## 2021-05-05 ENCOUNTER — Other Ambulatory Visit: Payer: Self-pay

## 2021-05-05 ENCOUNTER — Ambulatory Visit (HOSPITAL_COMMUNITY): Payer: BC Managed Care – PPO

## 2021-05-05 DIAGNOSIS — M25561 Pain in right knee: Secondary | ICD-10-CM | POA: Diagnosis not present

## 2021-05-05 DIAGNOSIS — R262 Difficulty in walking, not elsewhere classified: Secondary | ICD-10-CM | POA: Diagnosis not present

## 2021-05-05 DIAGNOSIS — M25661 Stiffness of right knee, not elsewhere classified: Secondary | ICD-10-CM

## 2021-05-05 DIAGNOSIS — G8929 Other chronic pain: Secondary | ICD-10-CM | POA: Diagnosis not present

## 2021-05-05 DIAGNOSIS — M1711 Unilateral primary osteoarthritis, right knee: Secondary | ICD-10-CM

## 2021-05-05 DIAGNOSIS — Z96651 Presence of right artificial knee joint: Secondary | ICD-10-CM

## 2021-05-05 NOTE — Therapy (Signed)
OUTPATIENT PHYSICAL THERAPY TREATMENT NOTE   Patient Name: Carrie [name] MRN: [id number] DOB:[birth year], 51 y.o., female Today's Date: 05/05/2021  PCP: Ladon Applebaum REFERRING PROVIDER: Magnus Ivan   PT End of Session - 05/05/21 1357     Visit Number 8    Number of Visits 24    Date for PT Re-Evaluation 06/19/21    Authorization Type BCBS no auth needed    PT Start Time 1350    PT Stop Time 1433    PT Time Calculation (min) 43 min    Activity Tolerance Patient tolerated treatment well;No increased pain    Behavior During Therapy WFL for tasks assessed/performed                  Past Medical History:  Diagnosis Date   Arthritis    Essential hypertension    GERD (gastroesophageal reflux disease)    Mixed hyperlipidemia    PONV (postoperative nausea and vomiting)    Past Surgical History:  Procedure Laterality Date   ABDOMINAL HYSTERECTOMY     HARDWARE REMOVAL Right 04/02/2021   Procedure: Right Knee HARDWARE REMOVAL;  Surgeon: Kathryne Hitch, MD;  Location: [facility];  Service: Orthopedics;  Laterality: Right;   KNEE ARTHROSCOPY  2009   MASS EXCISION  02/11/2011   Procedure: EXCISION MASS;  Surgeon: Kerrin Champagne, MD;  Location: [facility];  Service: Orthopedics;  Laterality: Right;  resection of neuroma right infrapatellar medial knee   PATELLA ARTHROPLASTY     rt knee-   TOTAL KNEE ARTHROPLASTY  2011   partial-[place] reg    TOTAL KNEE ARTHROPLASTY Right 04/02/2021   Procedure: Right TOTAL KNEE ARTHROPLASTY;  Surgeon: Kathryne Hitch, MD;  Location: [facility];  Service: Orthopedics;  Laterality: Right;   TUBAL LIGATION     Patient Active Problem List   Diagnosis Date Noted   Pain in right knee 04/19/2021   Stiffness of right knee, not elsewhere classified 04/19/2021   Difficulty in walking, not elsewhere classified 04/19/2021   Status post total knee replacement, right 04/02/2021   Allergic rhinitis due to pollen  06/22/2020   Eosinophilic esophagitis 06/22/2020   Rash and other nonspecific skin eruption 06/22/2020   Unilateral primary osteoarthritis, right knee 10/09/2017   AC joint arthropathy 01/04/2017   Acute pain of right shoulder 11/15/2016   Other derangements of patella, unspecified knee 09/02/2015   Recurrent dislocation of right patella 09/02/2015   Breast hypertrophy in female 05/13/2015   Neuroma of lower extremity 02/11/2011    Class: Chronic    REFERRING DIAG: Right TKA  04/02/21 surgery date  THERAPY DIAG:  Unilateral primary osteoarthritis, right knee  Difficulty in walking, not elsewhere classified  Stiffness of right knee, not elsewhere classified  Chronic pain of right knee  Status post total knee replacement, right  PERTINENT HISTORY: Prior right partial replacement 12+ years ago  PRECAUTIONS: none  SUBJECTIVE: Patient feeling a bit better overall,Patient reports she is walking in her home without her RW and comes into therapy today without an AD.  Patient does have a cane at home.  PAIN:  Are you having pain? Yes Are you having pain? Yes: NPRS scale: 4/10 Pain location:some "popping" in the knee today Pain description: throbs, aches Aggravating factors: movement Relieving factors: none recently       TODAY'S TREATMENT:   05/05/21 Manual STW to  Right knee, especially medial hamstrings x 10 min; no other treatment performed during manual treatment PROM Right knee  extension x 10 reps AAROM R knee flexion on table x 10 reps  Heel raises x 20 Gastroc stretch 5 x 20 sec Lunge on Right knee for knee flexion 2 x 10  Shallow squat x 10 Prone extension hang x 2 min with 1#  Bike half revolutions; seat 6 x 5 min TKE's GTB x 20  Right Knee PROM Knee Flexion in sitting end of session 90 degrees      05/03/21 Supine heel slides x 10 with gentle overpressure Manual STW to  Right knee, especially medial hamstrings x 10 min; no other treatment performed during  manual treatment PROM Right knee extension x 8 reps AAROM R knee flexion on table x 10 reps  Sitting   hamstring stretch with strap for extension 2 x 30 seconds Seated knee swings gentle at table top for relaxed muscle cueing x 60 seconds Seated heel slides x 10 NuStep 8 mins level 1 seat 8  Heel raises x 20 Gastroc stretch 5 x 20 sec Lunge on Right knee for knee flexion x 10  Shallow squat x 10 Prone extension hang x 1 min  Bike half revolutions; seat 6 x 5 min  Knee PROM end of session -14 supine knee extension,          04/30/21:  Supine SAQ into towel roll B for overflow cueing into right quad 2 x10  Supine heel slides x 10 with gentle overpressure Supine knee extension hang x 5 min; towel roll under heel Manual STW to  Right knee, especially medial hamstrings x 10 min; no other treatment performed during manual treatment PROM Right knee extension x 8 reps AAROM R knee flexion on table x 10 reps; AAROM R knee flexion 79 deg today Supine modified thomas stretch for knee flexion and hip opening x 60 seconds Supine straight leg raise hamstring stretch for extension 2 x 30 seconds Seated knee swings gentle at table top for relaxed muscle cueing x 60 seconds Seated heel slides x 10 NuStep 8 mins level 1 seat 8   Knee PROM end of session -10degrees extension to flexion of 79 degrees    PATIENT EDUCATION: Education details: discussed ice for pain management and swelling only after activity suggested today, no ice late at night, and more movement throughout evening before bed  Person educated: Patient Education method: Explanation and Demonstration Education comprehension: verbalized understanding  HOME EXERCISE PROGRAM: Access Code: [email or web address] URL: [email or web address] Date: 05/03/2021 Prepared by: [facility] - Rehab  Exercises Prone Knee Extension Hang - 3 x daily - 7 x weekly - 1 sets - 1 reps - 1-3 min hold   Access Code: [email or web address] URL:  [email or web address] Date: 04/19/2021 Prepared by: Lonzo Cloud   Exercises Supine Heel Slides - 3-5 x daily - 7 x weekly - 2 sets - 20 reps Supine Single Leg Ankle Pumps - 3-5 x daily - 7 x weekly - 2 sets - 20 reps Supine Quad Set (Mirrored) - 3 x daily - 7 x weekly - 2 sets - 20 reps Supine Short Arc Quad (Mirrored) - 3 x daily - 7 x weekly - 2 sets - 10 reps Supine Active Straight Leg Raise - 3 x daily - 7 x weekly - 2 sets - 10 reps Supine Hip Abduction - 3 x daily - 7 x weekly - 2 sets - 20 reps Seated Knee Flexion AAROM - 3 x daily - 7 x weekly - 2 sets - 10 reps - 5 seconds hold Seated Long Arc  Quad - 3 x daily - 7 x weekly - 2 sets - 10 reps  Access Code: [email or web address] URL: [email or web address] Date: 04/26/2021 Prepared by: AP - Rehab  Exercises Standing Gastroc Stretch on Step - 2 x daily - 7 x weekly - 5 sets - 1 reps - 20 hold Standing Knee Flexion Stretch on Step - 2 x daily - 7 x weekly - 2 sets - 10 reps Standing Hamstring Stretch with Step - 2 x daily - 7 x weekly - 1 sets - 5 reps - 20 hold    PT Short Term Goals -       PT SHORT TERM GOAL #1   Title Patient will be able to improve right knee AROM to 0 degrees extension to improve gait mechanics and standing safety.    Baseline 04/19/21: -14 deg extension AROM    Time 1    Period Months    Status New    Target Date 05/20/21      PT SHORT TERM GOAL #2   Title Patient will be able to improve right knee flexion AROM to 80 degrees to improve function.    Baseline 04/26/21 AAROM R knee 85 degrees in sitting today.    Time 1    Period Months    Status New    Target Date 05/20/21      PT SHORT TERM GOAL #3   Title Patient will be able to ambulate without use of front wheel walker throughout home for improved ADLs.    Baseline 04/19/21: Patient currently using front wheel walker consistently    Time 1    Period Months    Status New    Target Date 05/20/21              PT Long  Term Goals       PT LONG TERM GOAL #1   Title Patient will be able to get to 0 degrees extension and at least 110 degrees flexion for right knee AROM for proper mechanics.    Baseline 04/19/21: -14 deg extension and 62 degrees flexion end of session AROM    Time 2    Period Months    Status New    Target Date 06/19/21      PT LONG TERM GOAL #2   Title Patient will be able to ambulate in community over 30 minutes without assistive devices and with good form.    Baseline 04/19/21: using front wheeled walker at home and community    Time 2    Period Months    Status New    Target Date 06/19/21      PT LONG TERM GOAL #3   Title Patient will improve FOTO score to at least 69 degrees to show improved functional abilities    Baseline 04/19/21: 55 today    Time 2    Period Months    Status New    Target Date 06/19/21              Plan -     Clinical Impression Statement Today's session Patient continues with limited mobility and strength R knee, she reports some popping early today but it resolves during treatment. She continues with decreased heel strike and toe off  but demonstrates some improvement with verbal cues although she "peg legs" with walking.  Therapist added 1# to  prone knee extension hang to work on increasing knee mobility limitations. Added TKE's to improve knee extension.   Patient is a  good candidate to continue with skilled physical therapy to progress current needs of post operative recovery while improving functional status as able.    Personal Factors and Comorbidities Past/Current Experience;Time since onset of injury/illness/exacerbation    Examination-Activity Limitations Locomotion Level;Carry;Dressing;Hygiene/Grooming;Lift;Stand;Squat;Stairs    Examination-Participation Restrictions Cleaning;Community Activity;Laundry;Occupation;Yard Work    [employer]    PT Frequency 3x / week    PT Duration 8 weeks    PT Treatment/Interventions ADLs/Self Care Home  Management;Biofeedback;Gait training;Stair training;Functional mobility training;Therapeutic activities;Therapeutic exercise;Balance training;Neuromuscular re-education;Patient/family education;Manual techniques;Manual lymph drainage;Scar mobilization;Passive range of motion;Energy conservation;Taping;Vasopneumatic Device;Joint Manipulations    PT Next Visit Plan Step ups   Consulted and Agree with Plan of Care Patient                2:22 PM, 05/05/21 Evelena Masci Small Dilan Novosad MPT [facility] [place] [phone]

## 2021-05-07 ENCOUNTER — Ambulatory Visit (HOSPITAL_COMMUNITY): Payer: BC Managed Care – PPO | Admitting: Physical Therapy

## 2021-05-07 ENCOUNTER — Encounter (HOSPITAL_COMMUNITY): Payer: Self-pay | Admitting: Physical Therapy

## 2021-05-07 ENCOUNTER — Other Ambulatory Visit: Payer: Self-pay

## 2021-05-07 DIAGNOSIS — M25661 Stiffness of right knee, not elsewhere classified: Secondary | ICD-10-CM

## 2021-05-07 DIAGNOSIS — Z96651 Presence of right artificial knee joint: Secondary | ICD-10-CM | POA: Diagnosis not present

## 2021-05-07 DIAGNOSIS — M25561 Pain in right knee: Secondary | ICD-10-CM | POA: Diagnosis not present

## 2021-05-07 DIAGNOSIS — M1711 Unilateral primary osteoarthritis, right knee: Secondary | ICD-10-CM | POA: Diagnosis not present

## 2021-05-07 DIAGNOSIS — G8929 Other chronic pain: Secondary | ICD-10-CM | POA: Diagnosis not present

## 2021-05-07 DIAGNOSIS — R262 Difficulty in walking, not elsewhere classified: Secondary | ICD-10-CM | POA: Diagnosis not present

## 2021-05-07 NOTE — Therapy (Signed)
?OUTPATIENT PHYSICAL THERAPY TREATMENT NOTE ? ? ?Patient Name: Carrie [name] ?MRN: [id number] ?DOB:11/12/1970, 51 y.o., female ?Today's Date: 05/07/2021 ? ?PCP: Jake Samples, PA-C ?REFERRING PROVIDER: Ninfa Linden ? ? PT End of Session - 05/07/21 1127   ? ? Visit Number 9   ? Number of Visits 24   ? Date for PT Re-Evaluation 06/19/21   ? Authorization Type BCBS no auth needed   ? PT Start Time 1123   ? PT Stop Time 1210   ? PT Time Calculation (min) 47 min   ? Activity Tolerance Patient tolerated treatment well;No increased pain   ? Behavior During Therapy [facility] for tasks assessed/performed   ? ?  ?  ? ?  ? ? ? ? ? ? ? ?Past Medical History:  ?Diagnosis Date  ? Arthritis   ? Essential hypertension   ? GERD (gastroesophageal reflux disease)   ? Mixed hyperlipidemia   ? PONV (postoperative nausea and vomiting)   ? ?Past Surgical History:  ?Procedure Laterality Date  ? ABDOMINAL HYSTERECTOMY    ? HARDWARE REMOVAL Right 04/02/2021  ? Procedure: Right Knee HARDWARE REMOVAL;  Surgeon: Mcarthur Rossetti, MD;  Location: [facility];  Service: Orthopedics;  Laterality: Right;  ? KNEE ARTHROSCOPY  2009  ? MASS EXCISION  02/11/2011  ? Procedure: EXCISION MASS;  Surgeon: Jessy Oto, MD;  Location: [place];  Service: Orthopedics;  Laterality: Right;  resection of neuroma right infrapatellar medial knee  ? PATELLA ARTHROPLASTY    ? rt knee-  ? TOTAL KNEE ARTHROPLASTY  2011  ? partial-[place]   ? TOTAL KNEE ARTHROPLASTY Right 04/02/2021  ? Procedure: Right TOTAL KNEE ARTHROPLASTY;  Surgeon: Mcarthur Rossetti, MD;  Location: [facility];  Service: Orthopedics;  Laterality: Right;  ? TUBAL LIGATION    ? ?Patient Active Problem List  ? Diagnosis Date Noted  ? Pain in right knee 04/19/2021  ? Stiffness of right knee, not elsewhere classified 04/19/2021  ? Difficulty in walking, not elsewhere classified 04/19/2021  ? Status post total knee replacement, right 04/02/2021  ? Allergic rhinitis due to pollen  06/22/2020  ? Eosinophilic esophagitis 70/62/3762  ? Rash and other nonspecific skin eruption 06/22/2020  ? Unilateral primary osteoarthritis, right knee 10/09/2017  ? AC joint arthropathy 01/04/2017  ? Acute pain of right shoulder 11/15/2016  ? Other derangements of patella, unspecified knee 09/02/2015  ? Recurrent dislocation of right patella 09/02/2015  ? Breast hypertrophy in female 05/13/2015  ? Neuroma of lower extremity 02/11/2011  ?  Class: Chronic  ? ? ?REFERRING DIAG: Right TKA  04/02/21 surgery date ? ?THERAPY DIAG:  ?Unilateral primary osteoarthritis, right knee ? ?Difficulty in walking, not elsewhere classified ? ?Stiffness of right knee, not elsewhere classified ? ?Status post total knee replacement, right ? ?PERTINENT HISTORY: Prior right partial replacement 12+ years ago ? ?PRECAUTIONS: none ? ?SUBJECTIVE: Doing well with HEP, pain is minimal. Feeling good overall, still having popping in knee.  ?PAIN:  ?PAIN:  ?Are you having pain? No ? ? ? ?TODAY'S TREATMENT:  ? ?05/07/21 ?Nustep 6 min seat 6 lv2 for ROM  ?Calf stretch slant board 3 x30" ?Heel raise x20 ?Step up 4 inch HHA x 1 2 x 10 ?TKE 2 plates 2 x 10 ?Standing hip abduction 2 x 10 ?Partial squats 2 x 10  ?Knee driver on 12 inch box 5 x 10"   ?Seated hamstring stretch for knee extension 3 x 15" ? ? ?Manual PROM knee flexion (  patient in supine) AROM post manual: -10 to 82 degrees ? ? ?05/05/21 ?Manual STW to  Right knee, especially medial hamstrings x 10 min; no other treatment performed during manual treatment ?PROM Right knee extension x 10 reps ?AAROM R knee flexion on table x 10 reps  ?Heel raises x 20 ?Gastroc stretch 5 x 20 sec ?Lunge on Right knee for knee flexion 2 x 10  ?Shallow squat x 10 ?Prone extension hang x 2 min with 1#  ?Bike half revolutions; seat 6 x 5 min ?TKE's GTB x 20 ? ?Right Knee PROM Knee Flexion in sitting end of session 90 degrees ? ? ? ?PATIENT EDUCATION: ?Education details: Exercise form and function, updated HEP and  increased stretching frequency  ?Person educated: Patient ?Education method: Explanation and Demonstration ?Education comprehension: verbalized understanding ? ?HOME EXERCISE PROGRAM: ?Access Code: [email or web address] ?URL: [email or web address] ?Date: 05/07/2021 ?Prepared by: Josue Hector ? ?Exercises ?- Supine Heel Slide with Strap  - 4-5 x daily - 7 x weekly - 2 sets - 10 reps - 10 second hold ?- Supine Knee Extension Mobilization with Weight  - 4-5 x daily - 7 x weekly - 1 sets - 1 reps - 3-5 minutes holdAccess Code: [email or web address] ?URL: [email or web address] ?Date: 05/03/2021 ?Prepared by: [facility] - [facility] ? ?Exercises ?Prone Knee Extension Hang - 3 x daily - 7 x weekly - 1 sets - 1 reps - 1-3 min hold ? ? ?Access Code: [email or web address] ?URL: [email or web address] ?Date: 04/19/2021 ?Prepared by: Jerilynn Som ?  ?Exercises ?Supine Heel Slides - 3-5 x daily - 7 x weekly - 2 sets - 20 reps ?Supine Single Leg Ankle Pumps - 3-5 x daily - 7 x weekly - 2 sets - 20 reps ?Supine Quad Set (Mirrored) - 3 x daily - 7 x weekly - 2 sets - 20 reps ?Supine Short Arc Quad (Mirrored) - 3 x daily - 7 x weekly - 2 sets - 10 reps ?Supine Active Straight Leg Raise - 3 x daily - 7 x weekly - 2 sets - 10 reps ?Supine Hip Abduction - 3 x daily - 7 x weekly - 2 sets - 20 reps ?Seated Knee Flexion AAROM - 3 x daily - 7 x weekly - 2 sets - 10 reps - 5 seconds hold ?Seated Long Arc Quad - 3 x daily - 7 x weekly - 2 sets - 10 reps ? ?Access Code: [email or web address] ?URL: [email or web address] ?Date: 04/26/2021 ?Prepared by: [facility] ? ?Exercises ?Standing Gastroc Stretch on Step - 2 x daily - 7 x weekly - 5 sets - 1 reps - 20 hold ?Standing Knee Flexion Stretch on Step - 2 x daily - 7 x weekly - 2 sets - 10 reps ?Standing Hamstring Stretch with Step - 2 x daily - 7 x weekly - 1 sets - 5 reps - 20 hold ? ? ? PT Short Term Goals -   ? ?  ? PT SHORT TERM GOAL #1  ? Title Patient will be able to improve right knee AROM to  0 degrees extension to improve gait mechanics and standing safety.   ? Baseline 04/19/21: -14 deg extension AROM   ? Time 1   ? Period Months   ? Status New   ? Target Date 05/20/21   ?  ? PT SHORT TERM GOAL #2  ? Title Patient will be able to improve right knee flexion AROM to 80 degrees to improve function.   ? Baseline 04/26/21 AAROM R knee 85 degrees in sitting  today.   ? Time 1   ? Period Months   ? Status New   ? Target Date 05/20/21   ?  ? PT SHORT TERM GOAL #3  ? Title Patient will be able to ambulate without use of front wheel walker throughout home for improved ADLs.   ? Baseline 04/19/21: Patient currently using front wheel walker consistently   ? Time 1   ? Period Months   ? Status New   ? Target Date 05/20/21   ? ?  ?  ? ?  ? ? ? PT Long Term Goals   ? ?  ? PT LONG TERM GOAL #1  ? Title Patient will be able to get to 0 degrees extension and at least 110 degrees flexion for right knee AROM for proper mechanics.   ? Baseline 04/19/21: -14 deg extension and 62 degrees flexion end of session AROM   ? Time 2   ? Period Months   ? Status New   ? Target Date 06/19/21   ?  ? PT LONG TERM GOAL #2  ? Title Patient will be able to ambulate in community over 30 minutes without assistive devices and with good form.   ? Baseline 04/19/21: using front wheeled walker at home and community   ? Time 2   ? Period Months   ? Status New   ? Target Date 06/19/21   ?  ? PT LONG TERM GOAL #3  ? Title Patient will improve FOTO score to at least 69 degrees to show improved functional abilities   ? Baseline 04/19/21: 55 today   ? Time 2   ? Period Months   ? Status New   ? Target Date 06/19/21   ? ?  ?  ? ?  ? ? ? Plan -   ? ? Clinical Impression Statement Patient continues to be limited by knee AROM restrictions. Progressed mobility exercise and performed manual PROM targeted at improved knee flexion. Increased from 74 to 82 degrees. Educated patient on exercise modification and increased frequency to improve knee AROM measures and issued  updated HEP handout. Patient will continue to benefit from skilled therapy services to reduce remaining deficits and improve functional ability.  ?  ? Personal Factors and Comorbidities Past/Current Experien

## 2021-05-10 ENCOUNTER — Other Ambulatory Visit: Payer: Self-pay

## 2021-05-10 ENCOUNTER — Ambulatory Visit (HOSPITAL_COMMUNITY): Payer: BC Managed Care – PPO

## 2021-05-10 DIAGNOSIS — M25561 Pain in right knee: Secondary | ICD-10-CM | POA: Diagnosis not present

## 2021-05-10 DIAGNOSIS — G8929 Other chronic pain: Secondary | ICD-10-CM

## 2021-05-10 DIAGNOSIS — Z96651 Presence of right artificial knee joint: Secondary | ICD-10-CM | POA: Diagnosis not present

## 2021-05-10 DIAGNOSIS — R262 Difficulty in walking, not elsewhere classified: Secondary | ICD-10-CM | POA: Diagnosis not present

## 2021-05-10 DIAGNOSIS — M25661 Stiffness of right knee, not elsewhere classified: Secondary | ICD-10-CM | POA: Diagnosis not present

## 2021-05-10 DIAGNOSIS — M1711 Unilateral primary osteoarthritis, right knee: Secondary | ICD-10-CM | POA: Diagnosis not present

## 2021-05-10 NOTE — Therapy (Signed)
OUTPATIENT PHYSICAL THERAPY TREATMENT NOTE   Patient Name: Carrie [name] MRN: [id number] DOB:October 16, 1970, 51 y.o., female Today's Date: 05/10/2021  PCP: Ladon Applebaum REFERRING PROVIDER: Magnus Ivan   PT End of Session - 05/10/21 1307     Visit Number 10    Number of Visits 24    Date for PT Re-Evaluation 06/19/21    Authorization Type BCBS no auth needed    PT Start Time 1300    PT Stop Time 1348    PT Time Calculation (min) 48 min    Activity Tolerance Patient tolerated treatment well;No increased pain    Behavior During Therapy WFL for tasks assessed/performed                   Past Medical History:  Diagnosis Date   Arthritis    Essential hypertension    GERD (gastroesophageal reflux disease)    Mixed hyperlipidemia    PONV (postoperative nausea and vomiting)    Past Surgical History:  Procedure Laterality Date   ABDOMINAL HYSTERECTOMY     HARDWARE REMOVAL Right 04/02/2021   Procedure: Right Knee HARDWARE REMOVAL;  Surgeon: Kathryne Hitch, MD;  Location: [facility];  Service: Orthopedics;  Laterality: Right;   KNEE ARTHROSCOPY  2009   MASS EXCISION  02/11/2011   Procedure: EXCISION MASS;  Surgeon: Kerrin Champagne, MD;  Location: [facility];  Service: Orthopedics;  Laterality: Right;  resection of neuroma right infrapatellar medial knee   PATELLA ARTHROPLASTY     rt knee-   TOTAL KNEE ARTHROPLASTY  2011   partial-[place] reg    TOTAL KNEE ARTHROPLASTY Right 04/02/2021   Procedure: Right TOTAL KNEE ARTHROPLASTY;  Surgeon: Kathryne Hitch, MD;  Location: [facility];  Service: Orthopedics;  Laterality: Right;   TUBAL LIGATION     Patient Active Problem List   Diagnosis Date Noted   Pain in right knee 04/19/2021   Stiffness of right knee, not elsewhere classified 04/19/2021   Difficulty in walking, not elsewhere classified 04/19/2021   Status post total knee replacement, right 04/02/2021   Allergic rhinitis due to pollen  06/22/2020   Eosinophilic esophagitis 06/22/2020   Rash and other nonspecific skin eruption 06/22/2020   Unilateral primary osteoarthritis, right knee 10/09/2017   AC joint arthropathy 01/04/2017   Acute pain of right shoulder 11/15/2016   Other derangements of patella, unspecified knee 09/02/2015   Recurrent dislocation of right patella 09/02/2015   Breast hypertrophy in female 05/13/2015   Neuroma of lower extremity 02/11/2011    Class: Chronic    REFERRING DIAG: Right TKA  04/02/21 surgery date  THERAPY DIAG:  Unilateral primary osteoarthritis, right knee  Difficulty in walking, not elsewhere classified  Status post total knee replacement, right  Chronic pain of right knee  Stiffness of right knee, not elsewhere classified  PERTINENT HISTORY: Prior right partial replacement 12+ years ago  PRECAUTIONS: none  SUBJECTIVE: reports compliance with HEP; chief compliant of stiffness Right knee PAIN:  Are you having pain? No    TODAY'S TREATMENT:   05/10/21 Bike 1/2 revolutions 5 min seat 6  for ROM  Calf stretch slant board 3 x 30" Heel raise x 20 Step up 4 inch HHA x 1 2 x 10 TKE 2 plates 3 x 10 Standing hip abduction 2 x 10; 2#  Partial squats 2 x 10  Knee driver on 12 inch box 5 x 10"   Seated hamstring stretch for knee extension 5 x 20"  Right knee AROM ext in Supine -13, AAROM ext supine -7; Right knee AROM in sitting KF 84 degrees     05/07/21 Nustep 6 min seat 6 lv2 for ROM  Calf stretch slant board 3 x 30" Heel raise x 20 Step up 4 inch HHA x 1, 2 x 10 TKE 2 plates 2 x 10 Standing hip abduction 2 x 10 Partial squats 2 x 10  Knee driver on 12 inch box 5 x 10"   Seated hamstring stretch for knee extension 3 x 15"   Manual PROM knee flexion (patient in supine) AROM post manual: -10 to 82 degrees   05/05/21 Manual STW to  Right knee, especially medial hamstrings x 10 min; no other treatment performed during manual treatment PROM Right knee extension  x 10 reps AAROM R knee flexion on table x 10 reps  Heel raises x 20 Gastroc stretch 5 x 20 sec Lunge on Right knee for knee flexion 2 x 10  Shallow squat x 10 Prone extension hang x 2 min with 1#  Bike half revolutions; seat 6 x 5 min TKE's GTB x 20  Right Knee PROM Knee Flexion in sitting end of session 90 degrees    PATIENT EDUCATION: Education details: Exercise form and function, updated HEP and increased stretching frequency  Person educated: Patient Education method: Medical illustrator Education comprehension: verbalized understanding  HOME EXERCISE PROGRAM: Access Code: [email or web address] URL: [email or web address] Date: 05/07/2021 Prepared by: Georges Arionna Hoggard  Exercises - Supine Heel Slide with Strap  - 4-5 x daily - 7 x weekly - 2 sets - 10 reps - 10 second hold - Supine Knee Extension Mobilization with Weight  - 4-5 x daily - 7 x weekly - 1 sets - 1 reps - 3-5 minutes holdAccess Code: [email or web address] URL: [email or web address] Date: 05/03/2021 Prepared by: [facility] - Re[facility]  Exercises Prone Knee Extension Hang - 3 x daily - 7 x weekly - 1 sets - 1 reps - 1-3 min hold   Access Code: [email or web address] URL: [email or web address] Date: 04/19/2021 Prepared by: Lonzo Cloud   Exercises Supine Heel Slides - 3-5 x daily - 7 x weekly - 2 sets - 20 reps Supine Single Leg Ankle Pumps - 3-5 x daily - 7 x weekly - 2 sets - 20 reps Supine Quad Set (Mirrored) - 3 x daily - 7 x weekly - 2 sets - 20 reps Supine Short Arc Quad (Mirrored) - 3 x daily - 7 x weekly - 2 sets - 10 reps Supine Active Straight Leg Raise - 3 x daily - 7 x weekly - 2 sets - 10 reps Supine Hip Abduction - 3 x daily - 7 x weekly - 2 sets - 20 reps Seated Knee Flexion AAROM - 3 x daily - 7 x weekly - 2 sets - 10 reps - 5 seconds hold Seated Long Arc Quad - 3 x daily - 7 x weekly - 2 sets - 10 reps  Access Code: [email or web address] URL: [email or web address] Date:  04/26/2021 Prepared by: [facility] - Rehab  Exercises Standing Gastroc Stretch on Step - 2 x daily - 7 x weekly - 5 sets - 1 reps - 20 hold Standing Knee Flexion Stretch on Step - 2 x daily - 7 x weekly - 2 sets - 10 reps Standing Hamstring Stretch with Step - 2 x daily - 7 x weekly - 1 sets - 5 reps - 20 hold    PT Short Term Goals -       PT  SHORT TERM GOAL #1   Title Patient will be able to improve right knee AROM to 0 degrees extension to improve gait mechanics and standing safety.    Baseline 04/19/21: -14 deg extension AROM    Time 1    Period Months    Status New    Target Date 05/20/21      PT SHORT TERM GOAL #2   Title Patient will be able to improve right knee flexion AROM to 80 degrees to improve function.    Baseline 04/26/21 AAROM R knee 85 degrees in sitting today.    Time 1    Period Months    Status New    Target Date 05/20/21      PT SHORT TERM GOAL #3   Title Patient will be able to ambulate without use of front wheel walker throughout home for improved ADLs.    Baseline 04/19/21: Patient currently using front wheel walker consistently    Time 1    Period Months    Status New    Target Date 05/20/21              PT Long Term Goals       PT LONG TERM GOAL #1   Title Patient will be able to get to 0 degrees extension and at least 110 degrees flexion for right knee AROM for proper mechanics.    Baseline 04/19/21: -14 deg extension and 62 degrees flexion end of session AROM    Time 2    Period Months    Status New    Target Date 06/19/21      PT LONG TERM GOAL #2   Title Patient will be able to ambulate in community over 30 minutes without assistive devices and with good form.    Baseline 04/19/21: using front wheeled walker at home and community    Time 2    Period Months    Status New    Target Date 06/19/21      PT LONG TERM GOAL #3   Title Patient will improve FOTO score to at least 69 degrees to show improved functional abilities    Baseline 04/19/21: 55  today    Time 2    Period Months    Status New    Target Date 06/19/21              Plan -     Clinical Impression Statement Today's session continues to address improving patient's Right knee ROM.  She continues with limitations both knee flexion and extension.  She works hard in therapy. Ambulates with continued extension lag and decreased heel strike/toe off. Patient will continue to benefit from skilled therapy services to reduce remaining deficits and improve functional ability.     Personal Factors and Comorbidities Past/Current Experience;Time since onset of injury/illness/exacerbation    Examination-Activity Limitations Locomotion Level;Carry;Dressing;Hygiene/Grooming;Lift;Stand;Squat;Stairs    Examination-Participation Restrictions Cleaning;Community Activity;Laundry;Occupation;Yard Work    [employer]    PT Frequency 3x / week    PT Duration 8 weeks    PT Treatment/Interventions ADLs/Self Care Home Management;Biofeedback;Gait training;Stair training;Functional mobility training;Therapeutic activities;Therapeutic exercise;Balance training;Neuromuscular re-education;Patient/family education;Manual techniques;Manual lymph drainage;Scar mobilization;Passive range of motion;Energy conservation;Taping;Vasopneumatic Device;Joint Manipulations    PT Next Visit Plan Continue manual for improved ROM. Increase step height, add balance. Patient sees MD this Thurday 3/30   Consulted and Agree with Plan of Care Patient             1:59 PM, 05/10/21 Yomaira Solar Small Burnadette Peter  MPT [place] [facility] [phone]

## 2021-05-12 ENCOUNTER — Ambulatory Visit (HOSPITAL_COMMUNITY): Payer: BC Managed Care – PPO

## 2021-05-12 DIAGNOSIS — M25561 Pain in right knee: Secondary | ICD-10-CM | POA: Diagnosis not present

## 2021-05-12 DIAGNOSIS — M25661 Stiffness of right knee, not elsewhere classified: Secondary | ICD-10-CM

## 2021-05-12 DIAGNOSIS — M1711 Unilateral primary osteoarthritis, right knee: Secondary | ICD-10-CM | POA: Diagnosis not present

## 2021-05-12 DIAGNOSIS — R262 Difficulty in walking, not elsewhere classified: Secondary | ICD-10-CM | POA: Diagnosis not present

## 2021-05-12 DIAGNOSIS — Z96651 Presence of right artificial knee joint: Secondary | ICD-10-CM | POA: Diagnosis not present

## 2021-05-12 DIAGNOSIS — G8929 Other chronic pain: Secondary | ICD-10-CM | POA: Diagnosis not present

## 2021-05-12 NOTE — Therapy (Signed)
OUTPATIENT PHYSICAL THERAPY TREATMENT NOTE   Patient Name: Carrie [name] MRN: [id number] DOB:20-Sep-1970, 51 y.o., female Today's Date: 05/12/2021  PCP: Ladon Applebaum REFERRING PROVIDER: Magnus Ivan   PT End of Session - 05/12/21 1303     Visit Number 11    Number of Visits 24    Date for PT Re-Evaluation 06/19/21    Authorization Type BCBS no auth needed    PT Start Time 1301    PT Stop Time 1400    PT Time Calculation (min) 59 min    Activity Tolerance Patient tolerated treatment well;No increased pain    Behavior During Therapy WFL for tasks assessed/performed                    Past Medical History:  Diagnosis Date   Arthritis    Essential hypertension    GERD (gastroesophageal reflux disease)    Mixed hyperlipidemia    PONV (postoperative nausea and vomiting)    Past Surgical History:  Procedure Laterality Date   ABDOMINAL HYSTERECTOMY     HARDWARE REMOVAL Right 04/02/2021   Procedure: Right Knee HARDWARE REMOVAL;  Surgeon: Kathryne Hitch, MD;  Location: [facility];  Service: Orthopedics;  Laterality: Right;   KNEE ARTHROSCOPY  2009   MASS EXCISION  02/11/2011   Procedure: EXCISION MASS;  Surgeon: Kerrin Champagne, MD;  Location: [facility];  Service: Orthopedics;  Laterality: Right;  resection of neuroma right infrapatellar medial knee   PATELLA ARTHROPLASTY     rt knee-   TOTAL KNEE ARTHROPLASTY  2011   partial-[place] reg    TOTAL KNEE ARTHROPLASTY Right 04/02/2021   Procedure: Right TOTAL KNEE ARTHROPLASTY;  Surgeon: Kathryne Hitch, MD;  Location: [facility];  Service: Orthopedics;  Laterality: Right;   TUBAL LIGATION     Patient Active Problem List   Diagnosis Date Noted   Pain in right knee 04/19/2021   Stiffness of right knee, not elsewhere classified 04/19/2021   Difficulty in walking, not elsewhere classified 04/19/2021   Status post total knee replacement, right 04/02/2021   Allergic rhinitis due to pollen  06/22/2020   Eosinophilic esophagitis 06/22/2020   Rash and other nonspecific skin eruption 06/22/2020   Unilateral primary osteoarthritis, right knee 10/09/2017   AC joint arthropathy 01/04/2017   Acute pain of right shoulder 11/15/2016   Other derangements of patella, unspecified knee 09/02/2015   Recurrent dislocation of right patella 09/02/2015   Breast hypertrophy in female 05/13/2015   Neuroma of lower extremity 02/11/2011    Class: Chronic    REFERRING DIAG: Right TKA  04/02/21 surgery date  THERAPY DIAG:  Unilateral primary osteoarthritis, right knee  Chronic pain of right knee  Difficulty in walking, not elsewhere classified  Stiffness of right knee, not elsewhere classified  Status post total knee replacement, right  PERTINENT HISTORY: Prior right partial replacement 12+ years ago  PRECAUTIONS: none  SUBJECTIVE: reports compliance with HEP; continued chief compliant of stiffness Right knee, no pain reported. She sees her surgeon tomorrow, Thursday 05/13/21  PAIN:  Are you having pain? No    TODAY'S TREATMENT:  05/12/21 Bike 1/2 revolutions 5 min seat 6  for ROM  Calf stretch slant board 3 x 30" Heel raise x 20 Step up 4 inch HHA x 1,  2 x 10 TKE 2 plates 3 x 10 Standing hip abduction 2 x 10; 3#  Partial squats 2 x 10  Knee driver on 12 inch box 5 x  10"   Seated hamstring stretch for knee extension 5 x 20"  Right knee AROM ext in Supine -10, AAROM ext supine -4  Right knee AROM in sitting KF 85 degrees, 92 AAROM   05/10/21 Bike 1/2 revolutions 5 min seat 6  for ROM  Calf stretch slant board 3 x 30" Heel raise x 20 Step up 4 inch HHA x 1 2 x 10 TKE 2 plates 3 x 10 Standing hip abduction 2 x 10; 2#  Partial squats 2 x 10  Knee driver on 12 inch box 5 x 10"   Seated hamstring stretch for knee extension 5 x 20" Prone Right knee extension hang 5# x 5'   Right knee AROM ext in Supine -13, AAROM ext supine -7; Right knee AROM in sitting KF 84  degrees     05/07/21 Nustep 6 min seat 6 lv2 for ROM  Calf stretch slant board 3 x 30" Heel raise x 20 Step up 8 inch HHA x 1, 2 x 10 TKE 2 plates 2 x 10 Standing hip abduction 2 x 10 3# Partial squats 2 x 10  Knee driver on 12 inch box 5 x 10"   Seated hamstring stretch for knee extension 3 x 15"    Manual PROM knee flexion (patient in supine) AROM post manual: -10 to 82 degrees   05/05/21 Manual STW to  Right knee, especially medial hamstrings x 10 min; no other treatment performed during manual treatment PROM Right knee extension x 10 reps AAROM R knee flexion on table x 10 reps  Heel raises x 20 Gastroc stretch 5 x 20 sec Lunge on Right knee for knee flexion 2 x 10  Shallow squat x 10 Prone extension hang x 2 min with 1#  Bike half revolutions; seat 6 x 5 min TKE's GTB x 20  Right Knee PROM Knee Flexion in sitting end of session 90 degrees    PATIENT EDUCATION: Education details: Exercise form and function, updated HEP and increased stretching frequency  Person educated: Patient Education method: Medical illustrator Education comprehension: verbalized understanding  HOME EXERCISE PROGRAM: Access Code: [email or web address] URL: [email or web address] Date: 05/07/2021 Prepared by: Georges Onie Kasparek  Exercises - Supine Heel Slide with Strap  - 4-5 x daily - 7 x weekly - 2 sets - 10 reps - 10 second hold - Supine Knee Extension Mobilization with Weight  - 4-5 x daily - 7 x weekly - 1 sets - 1 reps - 3-5 minutes holdAccess Code: [email or web address] URL: [email or web address] Date: 05/03/2021 Prepared by: [facility] - Rehab  Exercises Prone Knee Extension Hang - 3 x daily - 7 x weekly - 1 sets - 1 reps - 1-3 min hold   Access Code: [email or web address] URL: [email or web address] Date: 04/19/2021 Prepared by: Lonzo Cloud   Exercises Supine Heel Slides - 3-5 x daily - 7 x weekly - 2 sets - 20 reps Supine Single Leg Ankle Pumps - 3-5 x daily - 7 x  weekly - 2 sets - 20 reps Supine Quad Set (Mirrored) - 3 x daily - 7 x weekly - 2 sets - 20 reps Supine Short Arc Quad (Mirrored) - 3 x daily - 7 x weekly - 2 sets - 10 reps Supine Active Straight Leg Raise - 3 x daily - 7 x weekly - 2 sets - 10 reps Supine Hip Abduction - 3 x daily - 7 x weekly - 2 sets - 20 reps Seated Knee Flexion AAROM - 3 x daily - 7 x weekly -  2 sets - 10 reps - 5 seconds hold Seated Long Arc Quad - 3 x daily - 7 x weekly - 2 sets - 10 reps  Access Code: [email or web address] URL: [email or web address] Date: 04/26/2021 Prepared by: AP - Rehab  Exercises Standing Gastroc Stretch on Step - 2 x daily - 7 x weekly - 5 sets - 1 reps - 20 hold Standing Knee Flexion Stretch on Step - 2 x daily - 7 x weekly - 2 sets - 10 reps Standing Hamstring Stretch with Step - 2 x daily - 7 x weekly - 1 sets - 5 reps - 20 hold    PT Short Term Goals -       PT SHORT TERM GOAL #1   Title Patient will be able to improve right knee AROM to 0 degrees extension to improve gait mechanics and standing safety.    Baseline 05/12/21:-10 AROM,  -4 deg extension aAROM    Time 1    Period Months    Status New    Target Date 05/20/21      PT SHORT TERM GOAL #2   Title Patient will be able to improve right knee flexion AROM to 80 degrees to improve function.    Baseline 04/26/21 AAROM R knee 92 degrees in sitting today.    Time 1    Period Months    Status Met 05/10/21   Target Date 05/20/21      PT SHORT TERM GOAL #3   Title Patient will be able to ambulate without use of front wheel walker throughout home for improved ADLs.    Baseline 04/19/21: Patient currently using front wheel walker consistently    Time 1    Period Months    Status Met 05/12/21   Target Date 05/20/21              PT Long Term Goals       PT LONG TERM GOAL #1   Title Patient will be able to get to 0 degrees extension and at least 110 degrees flexion for right knee AROM for proper mechanics.    Baseline  04/19/21: -14 deg extension and 62 degrees flexion end of session AROM    Time 2    Period Months    Status New    Target Date 06/19/21      PT LONG TERM GOAL #2   Title Patient will be able to ambulate in community over 30 minutes without assistive devices and with good form.    Baseline 04/19/21: using front wheeled walker at home and community    Time 2    Period Months    Status New    Target Date 06/19/21      PT LONG TERM GOAL #3   Title Patient will improve FOTO score to at least 69 degrees to show improved functional abilities    Baseline 04/19/21: 55 today    Time 2    Period Months    Status New    Target Date 06/19/21              Plan -     Clinical Impression Statement Today's session continues to address improving patient's mobility and Right knee ROM.  She continues with limitations both knee flexion and extension but has made good improvements with both.   She works hard in therapy. Ambulates with continued extension lag and decreased heel strike/toe off but is walking without an AD for the past week.. Patient will continue  to benefit from skilled therapy services to reduce remaining deficits and improve functional ability.     Personal Factors and Comorbidities Past/Current Experience;Time since onset of injury/illness/exacerbation    Examination-Activity Limitations Locomotion Level;Carry;Dressing;Hygiene/Grooming;Lift;Stand;Squat;Stairs    Examination-Participation Restrictions Cleaning;Community Activity;Laundry;Occupation;Yard Work    [employer]    PT Frequency 3x / week    PT Duration 8 weeks    PT Treatment/Interventions ADLs/Self Care Home Management;Biofeedback;Gait training;Stair training;Functional mobility training;Therapeutic activities;Therapeutic exercise;Balance training;Neuromuscular re-education;Patient/family education;Manual techniques;Manual lymph drainage;Scar mobilization;Passive range of motion;Energy conservation;Taping;Vasopneumatic  Device;Joint Manipulations    PT Next Visit Plan Continue manual for improved ROM. Increase step height, add balance. Patient sees MD this Thurday 3/30   Consulted and Agree with Plan of Care Patient             2:01 PM, 05/12/21 Esti Demello Small Segundo Makela MPT [place] [facility] [place] [phone]

## 2021-05-13 ENCOUNTER — Ambulatory Visit (INDEPENDENT_AMBULATORY_CARE_PROVIDER_SITE_OTHER): Payer: BC Managed Care – PPO | Admitting: Orthopaedic Surgery

## 2021-05-13 ENCOUNTER — Encounter: Payer: Self-pay | Admitting: Orthopaedic Surgery

## 2021-05-13 DIAGNOSIS — T8482XA Fibrosis due to internal orthopedic prosthetic devices, implants and grafts, initial encounter: Secondary | ICD-10-CM

## 2021-05-13 DIAGNOSIS — Z96651 Presence of right artificial knee joint: Secondary | ICD-10-CM

## 2021-05-13 MED ORDER — HYDROCODONE-ACETAMINOPHEN 7.5-325 MG PO TABS: 1.0000 | ORAL_TABLET | Freq: Four times a day (QID) | ORAL | 0 refills | Status: DC | PRN

## 2021-05-13 NOTE — Therapy (Signed)
OUTPATIENT PHYSICAL THERAPY TREATMENT NOTE   Patient Name: Carrie [name] MRN: [id number] DOB:11-10-1970, 51 y.o., female Today's Date: 05/14/2021  PCP: Ladon Applebaum REFERRING PROVIDER: Magnus Ivan   PT End of Session - 05/14/21 1128     Visit Number 12    Number of Visits 24    Date for PT Re-Evaluation 06/19/21    Authorization Type BCBS no auth needed    PT Start Time 1117    PT Stop Time 1200    PT Time Calculation (min) 43 min    Activity Tolerance Patient tolerated treatment well;No increased pain    Behavior During Therapy WFL for tasks assessed/performed                     Past Medical History:  Diagnosis Date   Arthritis    Diabetes mellitus without complication (HCC)    Essential hypertension    GERD (gastroesophageal reflux disease)    Mixed hyperlipidemia    PONV (postoperative nausea and vomiting)    Past Surgical History:  Procedure Laterality Date   ABDOMINAL HYSTERECTOMY     HARDWARE REMOVAL Right 04/02/2021   Procedure: Right Knee HARDWARE REMOVAL;  Surgeon: Kathryne Hitch, MD;  Location: [facility];  Service: Orthopedics;  Laterality: Right;   KNEE ARTHROSCOPY  2009   MASS EXCISION  02/11/2011   Procedure: EXCISION MASS;  Surgeon: Kerrin Champagne, MD;  Location: [facility];  Service: Orthopedics;  Laterality: Right;  resection of neuroma right infrapatellar medial knee   PATELLA ARTHROPLASTY     rt knee-   TOTAL KNEE ARTHROPLASTY  2011   partial-[place] reg    TOTAL KNEE ARTHROPLASTY Right 04/02/2021   Procedure: Right TOTAL KNEE ARTHROPLASTY;  Surgeon: Kathryne Hitch, MD;  Location: [facility];  Service: Orthopedics;  Laterality: Right;   TUBAL LIGATION     Patient Active Problem List   Diagnosis Date Noted   Pain in right knee 04/19/2021   Stiffness of right knee, not elsewhere classified 04/19/2021   Difficulty in walking, not elsewhere classified 04/19/2021   Status post total knee replacement,  right 04/02/2021   Allergic rhinitis due to pollen 06/22/2020   Eosinophilic esophagitis 06/22/2020   Rash and other nonspecific skin eruption 06/22/2020   Unilateral primary osteoarthritis, right knee 10/09/2017   AC joint arthropathy 01/04/2017   Acute pain of right shoulder 11/15/2016   Other derangements of patella, unspecified knee 09/02/2015   Recurrent dislocation of right patella 09/02/2015   Breast hypertrophy in female 05/13/2015   Neuroma of lower extremity 02/11/2011    Class: Chronic    REFERRING DIAG: Right TKA  04/02/21 surgery date  THERAPY DIAG:  Unilateral primary osteoarthritis, right knee  Stiffness of right knee, not elsewhere classified  Chronic pain of right knee  Status post total knee replacement, right  Difficulty in walking, not elsewhere classified  PERTINENT HISTORY: Prior right partial replacement 12+ years ago  PRECAUTIONS: none  SUBJECTIVE:  Patient scheduled for a manipulation on Thursday 4/6 per Dr. Rayburn Ma  PAIN:  Are you having pain? No just stiffness    TODAY'S TREATMENT:  05/14/21 Bike 1/2 revolutions 5 min seat 6  for ROM  Calf stretch slant board 3 x 30" Heel raise x 20 Step up 4 inch HHA x 1,  2 x 10 TKE 2 plates 3 x 10 Standing hip abduction 2 x 10; 3#  Partial squats 2 x 10  Knee driver on 12 inch box  5 x 10"   Seated hamstring stretch for knee extension 5 x 20"   05/12/21 Bike 1/2 revolutions 5 min seat 6  for ROM  Calf stretch slant board 3 x 30" Heel raise x 20 Step up 4 inch HHA x 1,  2 x 10 TKE 2 plates 3 x 10 Standing hip abduction 2 x 10; 3#  Partial squats 2 x 10  Knee driver on 12 inch box 5 x 10"   Seated hamstring stretch for knee extension 5 x 20"  Right knee AROM ext in Supine -10, AAROM ext supine -4  Right knee AROM in sitting KF 85 degrees, 92 AAROM   05/10/21 Bike 1/2 revolutions 5 min seat 6  for ROM  Calf stretch slant board 3 x 30" Heel raise x 20 Step up 4 inch HHA x 1 2 x 10 TKE 2  plates 3 x 10 Standing hip abduction 2 x 10; 2#  Partial squats 2 x 10  Knee driver on 12 inch box 5 x 10"   Seated hamstring stretch for knee extension 5 x 20" Prone Right knee extension hang 5# x 5'   Right knee AROM ext in Supine -13, AAROM ext supine -7; Right knee AROM in sitting KF 84 degrees     05/07/21 Nustep 6 min seat 6 lv2 for ROM  Calf stretch slant board 3 x 30" Heel raise x 20 Step up 8 inch HHA x 1, 2 x 10 TKE 2 plates 2 x 10 Standing hip abduction 2 x 10 3# Partial squats 2 x 10  Knee driver on 12 inch box 5 x 10"   Seated hamstring stretch for knee extension 3 x 15"    Manual PROM knee flexion (patient in supine) AROM post manual: -10 to 82 degrees   05/05/21 Manual STW to  Right knee, especially medial hamstrings x 10 min; no other treatment performed during manual treatment PROM Right knee extension x 10 reps AAROM R knee flexion on table x 10 reps  Heel raises x 20 Gastroc stretch 5 x 20 sec Lunge on Right knee for knee flexion 2 x 10  Shallow squat x 10 Prone extension hang x 2 min with 1#  Bike half revolutions; seat 6 x 5 min TKE's GTB x 20  Right Knee PROM Knee Flexion in sitting end of session 90 degrees    PATIENT EDUCATION: Education details: Exercise form and function, updated HEP and increased stretching frequency  Person educated: Patient Education method: Medical illustrator Education comprehension: verbalized understanding  HOME EXERCISE PROGRAM: Access Code: [email or web address] URL: [email or web address] Date: 05/07/2021 Prepared by: Georges Cheryln Balcom  Exercises - Supine Heel Slide with Strap  - 4-5 x daily - 7 x weekly - 2 sets - 10 reps - 10 second hold - Supine Knee Extension Mobilization with Weight  - 4-5 x daily - 7 x weekly - 1 sets - 1 reps - 3-5 minutes holdAccess Code: [email or web address] URL: [email or web address] Date: 05/03/2021 Prepared by: [facility] - Rehab  Exercises Prone Knee Extension Hang - 3 x  daily - 7 x weekly - 1 sets - 1 reps - 1-3 min hold   Access Code: [email or web address] URL: [email or web address] Date: 04/19/2021 Prepared by: Lonzo Cloud   Exercises Supine Heel Slides - 3-5 x daily - 7 x weekly - 2 sets - 20 reps Supine Single Leg Ankle Pumps - 3-5 x daily - 7 x weekly - 2 sets - 20 reps Supine Quad Set (Mirrored) - 3 x  daily - 7 x weekly - 2 sets - 20 reps Supine Short Arc Quad (Mirrored) - 3 x daily - 7 x weekly - 2 sets - 10 reps Supine Active Straight Leg Raise - 3 x daily - 7 x weekly - 2 sets - 10 reps Supine Hip Abduction - 3 x daily - 7 x weekly - 2 sets - 20 reps Seated Knee Flexion AAROM - 3 x daily - 7 x weekly - 2 sets - 10 reps - 5 seconds hold Seated Long Arc Quad - 3 x daily - 7 x weekly - 2 sets - 10 reps  Access Code: [email or web address] URL: [email or web address] Date: 04/26/2021 Prepared by: AP - Rehab  Exercises Standing Gastroc Stretch on Step - 2 x daily - 7 x weekly - 5 sets - 1 reps - 20 hold Standing Knee Flexion Stretch on Step - 2 x daily - 7 x weekly - 2 sets - 10 reps Standing Hamstring Stretch with Step - 2 x daily - 7 x weekly - 1 sets - 5 reps - 20 hold    PT Short Term Goals -       PT SHORT TERM GOAL #1   Title Patient will be able to improve right knee AROM to 0 degrees extension to improve gait mechanics and standing safety.    Baseline 05/12/21:-10 AROM,  -4 deg extension aAROM    Time 1    Period Months    Status New    Target Date 05/20/21      PT SHORT TERM GOAL #2   Title Patient will be able to improve right knee flexion AROM to 80 degrees to improve function.    Baseline 04/26/21 AAROM R knee 92 degrees in sitting today.    Time 1    Period Months    Status Met 05/10/21   Target Date 05/20/21      PT SHORT TERM GOAL #3   Title Patient will be able to ambulate without use of front wheel walker throughout home for improved ADLs.    Baseline 04/19/21: Patient currently using front wheel walker  consistently    Time 1    Period Months    Status Met 05/12/21   Target Date 05/20/21              PT Long Term Goals       PT LONG TERM GOAL #1   Title Patient will be able to get to 0 degrees extension and at least 110 degrees flexion for right knee AROM for proper mechanics.    Baseline 04/19/21: -14 deg extension and 62 degrees flexion end of session AROM    Time 2    Period Months    Status New    Target Date 06/19/21      PT LONG TERM GOAL #2   Title Patient will be able to ambulate in community over 30 minutes without assistive devices and with good form.    Baseline 04/19/21: using front wheeled walker at home and community    Time 2    Period Months    Status New    Target Date 06/19/21      PT LONG TERM GOAL #3   Title Patient will improve FOTO score to at least 69 degrees to show improved functional abilities    Baseline 04/19/21: 54 today    Time 2    Period Months    Status New    Target Date 06/19/21  Plan -     Clinical Impression Statement Today's session continues to address improving patient's mobility and Right knee ROM.  She continues with limitations both knee flexion and extension but has made good improvements with both.   She continues to work hard in therapy. Ambulates with continued extension lag and decreased heel strike/toe off but is walking without an AD.  Patient will continue to benefit from skilled therapy services to reduce remaining deficits and improve functional ability to prepare for manipulation next week.     Personal Factors and Comorbidities Past/Current Experience;Time since onset of injury/illness/exacerbation    Examination-Activity Limitations Locomotion Level;Carry;Dressing;Hygiene/Grooming;Lift;Stand;Squat;Stairs    Examination-Participation Restrictions Cleaning;Community Activity;Laundry;Occupation;Yard Work    [employer]    PT Frequency 3x / week    PT Duration 8 weeks    PT  Treatment/Interventions ADLs/Self Care Home Management;Biofeedback;Gait training;Stair training;Functional mobility training;Therapeutic activities;Therapeutic exercise;Balance training;Neuromuscular re-education;Patient/family education;Manual techniques;Manual lymph drainage;Scar mobilization;Passive range of motion;Energy conservation;Taping;Vasopneumatic Device;Joint Manipulations    PT Next Visit Plan   Patient scheduled for manipulation Thursday 4/6   Consulted and Agree with Plan of Care Patient             12:05 PM, 05/14/21 Meggan Dhaliwal Small Janene Yousuf MPT [place] [facility] [place] [phone]

## 2021-05-13 NOTE — Progress Notes (Signed)
The patient is now 6 weeks status post removal of a patellofemoral arthroplasty and conversion to a right total knee arthroplasty.  She has been going through physical therapy diligently.  Physical therapy was delayed unfortunately by 2 weeks after her initial surgery. ? ?On exam she lacks full extension by at least 5 degrees and I can only flex her to about 85 degrees. ? ?At this point I recommended a manipulation under anesthesia of her right knee.  She does state that even after her just patellofemoral arthroplasty that she needed a manipulation.  I did review her notes from physical therapy as well.  We think this is the best idea at this standpoint and she agrees as well.  She understands what this type of surgery involves.  We will work on getting this scheduled.  I will send in some hydrocodone for her for pain. ? ?Once we have manipulated her knee we will see her back about 2 weeks after that.  All question concerns were answered and addressed.  Our surgery scheduler will be in touch with her. ?

## 2021-05-14 ENCOUNTER — Other Ambulatory Visit: Payer: Self-pay | Admitting: Physician Assistant

## 2021-05-14 ENCOUNTER — Other Ambulatory Visit: Payer: Self-pay

## 2021-05-14 ENCOUNTER — Ambulatory Visit (HOSPITAL_COMMUNITY): Payer: BC Managed Care – PPO

## 2021-05-14 ENCOUNTER — Encounter (HOSPITAL_BASED_OUTPATIENT_CLINIC_OR_DEPARTMENT_OTHER): Payer: Self-pay | Admitting: Orthopaedic Surgery

## 2021-05-14 DIAGNOSIS — R262 Difficulty in walking, not elsewhere classified: Secondary | ICD-10-CM | POA: Diagnosis not present

## 2021-05-14 DIAGNOSIS — G8929 Other chronic pain: Secondary | ICD-10-CM | POA: Diagnosis not present

## 2021-05-14 DIAGNOSIS — M25561 Pain in right knee: Secondary | ICD-10-CM | POA: Diagnosis not present

## 2021-05-14 DIAGNOSIS — M1711 Unilateral primary osteoarthritis, right knee: Secondary | ICD-10-CM | POA: Diagnosis not present

## 2021-05-14 DIAGNOSIS — Z96651 Presence of right artificial knee joint: Secondary | ICD-10-CM

## 2021-05-14 DIAGNOSIS — M25661 Stiffness of right knee, not elsewhere classified: Secondary | ICD-10-CM

## 2021-05-15 DIAGNOSIS — E119 Type 2 diabetes mellitus without complications: Secondary | ICD-10-CM | POA: Diagnosis not present

## 2021-05-17 ENCOUNTER — Ambulatory Visit (HOSPITAL_COMMUNITY): Payer: BC Managed Care – PPO | Attending: Orthopaedic Surgery

## 2021-05-17 DIAGNOSIS — Z96651 Presence of right artificial knee joint: Secondary | ICD-10-CM | POA: Insufficient documentation

## 2021-05-17 DIAGNOSIS — M25561 Pain in right knee: Secondary | ICD-10-CM | POA: Insufficient documentation

## 2021-05-17 DIAGNOSIS — G8929 Other chronic pain: Secondary | ICD-10-CM | POA: Diagnosis not present

## 2021-05-17 DIAGNOSIS — M25661 Stiffness of right knee, not elsewhere classified: Secondary | ICD-10-CM | POA: Insufficient documentation

## 2021-05-17 DIAGNOSIS — R262 Difficulty in walking, not elsewhere classified: Secondary | ICD-10-CM | POA: Diagnosis not present

## 2021-05-17 DIAGNOSIS — M1711 Unilateral primary osteoarthritis, right knee: Secondary | ICD-10-CM | POA: Insufficient documentation

## 2021-05-17 NOTE — Therapy (Signed)
OUTPATIENT PHYSICAL THERAPY TREATMENT NOTE   Patient Name: Carrie [name] MRN: [id number] DOB:[birth year], 51 y.o., female Today's Date: 05/17/2021  PCP: Ladon Applebaum REFERRING PROVIDER: Magnus Ivan   PT End of Session - 05/17/21 1111     Visit Number 13    Number of Visits 24    Date for PT Re-Evaluation 06/19/21    Authorization Type BCBS no auth needed    PT Start Time 1110    PT Stop Time 1155    PT Time Calculation (min) 45 min    Activity Tolerance Patient tolerated treatment well;No increased pain    Behavior During Therapy WFL for tasks assessed/performed                      Past Medical History:  Diagnosis Date   Arthritis    Diabetes mellitus without complication (HCC)    Essential hypertension    GERD (gastroesophageal reflux disease)    Mixed hyperlipidemia    PONV (postoperative nausea and vomiting)    Past Surgical History:  Procedure Laterality Date   ABDOMINAL HYSTERECTOMY     HARDWARE REMOVAL Right 04/02/2021   Procedure: Right Knee HARDWARE REMOVAL;  Surgeon: Kathryne Hitch, MD;  Location: [facility];  Service: Orthopedics;  Laterality: Right;   KNEE ARTHROSCOPY  2009   MASS EXCISION  02/11/2011   Procedure: EXCISION MASS;  Surgeon: Kerrin Champagne, MD;  Location: [facility];  Service: Orthopedics;  Laterality: Right;  resection of neuroma right infrapatellar medial knee   PATELLA ARTHROPLASTY     rt knee-   TOTAL KNEE ARTHROPLASTY  2011   partial-[place] [facility]    TOTAL KNEE ARTHROPLASTY Right 04/02/2021   Procedure: Right TOTAL KNEE ARTHROPLASTY;  Surgeon: Kathryne Hitch, MD;  Location: [facility];  Service: Orthopedics;  Laterality: Right;   TUBAL LIGATION     Patient Active Problem List   Diagnosis Date Noted   Pain in right knee 04/19/2021   Stiffness of right knee, not elsewhere classified 04/19/2021   Difficulty in walking, not elsewhere classified 04/19/2021   Status post total knee replacement,  right 04/02/2021   Allergic rhinitis due to pollen 06/22/2020   Eosinophilic esophagitis 06/22/2020   Rash and other nonspecific skin eruption 06/22/2020   Unilateral primary osteoarthritis, right knee 10/09/2017   AC joint arthropathy 01/04/2017   Acute pain of right shoulder 11/15/2016   Other derangements of patella, unspecified knee 09/02/2015   Recurrent dislocation of right patella 09/02/2015   Breast hypertrophy in female 05/13/2015   Neuroma of lower extremity 02/11/2011    Class: Chronic    REFERRING DIAG: Right TKA  04/02/21 surgery date  THERAPY DIAG:  Unilateral primary osteoarthritis, right knee  Difficulty in walking, not elsewhere classified  Stiffness of right knee, not elsewhere classified  Status post total knee replacement, right  Chronic pain of right knee  PERTINENT HISTORY: Prior right partial replacement 12+ years ago  PRECAUTIONS: none  SUBJECTIVE:  Patient scheduled for a manipulation on Thursday 4/6 per Dr. Rayburn Ma; she did not take her pain medication today because it made her sick last time she took it.   PAIN:  Are you having pain? No just stiffness      TODAY'S TREATMENT:  05/17/21 Bike  1/2 revolutions 5 min seat 6 for ROM Calf stretch slant board 3 x 30" Heel raise x 20 Step up 8 inch HHA x 1,  2 x 10 TKE 3 plates 3 x  10 Standing hip abduction 2 x 10; 4 #  Partial squats 2 x 10  Knee driver on 12 inch box 5 x 10"   Seated hamstring stretch for knee extension 5 x 20"  Manual Knee flexion and extension x 10 each   05/14/21 Bike 1/2 revolutions 5 min seat 6  for ROM  Calf stretch slant board 3 x 30" Heel raise x 20 Step up 4 inch HHA x 1,  2 x 10 TKE 2 plates 3 x 10 Standing hip abduction 2 x 10; 3#  Partial squats 2 x 10  Knee driver on 12 inch box 5 x 10"   Seated hamstring stretch for knee extension 5 x 20"   05/12/21 Bike 1/2 revolutions 5 min seat 6  for ROM  Calf stretch slant board 3 x 30" Heel raise x 20 Step up  4 inch HHA x 1,  2 x 10 TKE 2 plates 3 x 10 Standing hip abduction 2 x 10; 3#  Partial squats 2 x 10  Knee driver on 12 inch box 5 x 10"   Seated hamstring stretch for knee extension 5 x 20"  Right knee AROM ext in Supine -10, AAROM ext supine -4  Right knee AROM in sitting KF 85 degrees, 92 AAROM   05/10/21 Bike 1/2 revolutions 5 min seat 6  for ROM  Calf stretch slant board 3 x 30" Heel raise x 20 Step up 4 inch HHA x 1 2 x 10 TKE 2 plates 3 x 10 Standing hip abduction 2 x 10; 2#  Partial squats 2 x 10  Knee driver on 12 inch box 5 x 10"   Seated hamstring stretch for knee extension 5 x 20" Prone Right knee extension hang 5# x 5'   Right knee AROM ext in Supine -13, AAROM ext supine -7; Right knee AROM in sitting KF 84 degrees     PATIENT EDUCATION: Education details: Exercise form and function, updated HEP and increased stretching frequency  Person educated: Patient Education method: Medical illustrator Education comprehension: verbalized understanding  HOME EXERCISE PROGRAM: Exercises - Supine Heel Slide with Strap  - 4-5 x daily - 7 x weekly - 2 sets - 10 reps - 10 second hold - Supine Knee Extension Mobilization with Weight  - 4-5 x daily - 7 x weekly - 1 sets - 1 reps - 3-5 minutes holdAccess Code: [email or web address] Exercises Prone Knee Extension Hang - 3 x daily - 7 x weekly - 1 sets - 1 reps - 1-3 min hold    Exercises Supine Heel Slides - 3-5 x daily - 7 x weekly - 2 sets - 20 reps Supine Single Leg Ankle Pumps - 3-5 x daily - 7 x weekly - 2 sets - 20 reps Supine Quad Set (Mirrored) - 3 x daily - 7 x weekly - 2 sets - 20 reps Supine Short Arc Quad (Mirrored) - 3 x daily - 7 x weekly - 2 sets - 10 reps Supine Active Straight Leg Raise - 3 x daily - 7 x weekly - 2 sets - 10 reps Supine Hip Abduction - 3 x daily - 7 x weekly - 2 sets - 20 reps Seated Knee Flexion AAROM - 3 x daily - 7 x weekly - 2 sets - 10 reps - 5 seconds hold Seated Long Arc Quad - 3 x  daily - 7 x weekly - 2 sets - 10 reps  Exercises Standing Gastroc Stretch on Step - 2  x daily - 7 x weekly - 5 sets - 1 reps - 20 hold Standing Knee Flexion Stretch on Step - 2 x daily - 7 x weekly - 2 sets - 10 reps Standing Hamstring Stretch with Step - 2 x daily - 7 x weekly - 1 sets - 5 reps - 20 hold    PT Short Term Goals -       PT SHORT TERM GOAL #1   Title Patient will be able to improve right knee AROM to 0 degrees extension to improve gait mechanics and standing safety.    Baseline 05/12/21:-10 AROM,  -4 deg extension aAROM    Time 1    Period Months    Status New    Target Date 05/20/21      PT SHORT TERM GOAL #2   Title Patient will be able to improve right knee flexion AROM to 80 degrees to improve function.    Baseline 04/26/21 AAROM R knee 92 degrees in sitting today.    Time 1    Period Months    Status Met 05/10/21   Target Date 05/20/21      PT SHORT TERM GOAL #3   Title Patient will be able to ambulate without use of front wheel walker throughout home for improved ADLs.    Baseline 04/19/21: Patient currently using front wheel walker consistently    Time 1    Period Months    Status Met 05/12/21   Target Date 05/20/21              PT Long Term Goals       PT LONG TERM GOAL #1   Title Patient will be able to get to 0 degrees extension and at least 110 degrees flexion for right knee AROM for proper mechanics.    Baseline 04/19/21: -14 deg extension and 62 degrees flexion end of session AROM    Time 2    Period Months    Status New    Target Date 06/19/21      PT LONG TERM GOAL #2   Title Patient will be able to ambulate in community over 30 minutes without assistive devices and with good form.    Baseline 04/19/21: using front wheeled walker at home and community    Time 2    Period Months    Status New    Target Date 06/19/21      PT LONG TERM GOAL #3   Title Patient will improve FOTO score to at least 69 degrees to show improved functional  abilities    Baseline 04/19/21: 55 today    Time 2    Period Months    Status New    Target Date 06/19/21              Plan -     Clinical Impression Statement Today's session continues to address improving patient's mobility and Right knee ROM.  She is preparing for her manipulation on Thursday. Increased step height to 8" for knee driver and hip Abduction to 4#,  increased TKE to 3 plates. Patient will continue to benefit from skilled therapy services to reduce remaining deficits and improve functional ability to prepare for manipulation next week.     Personal Factors and Comorbidities Past/Current Experience;Time since onset of injury/illness/exacerbation    Examination-Activity Limitations Locomotion Level;Carry;Dressing;Hygiene/Grooming;Lift;Stand;Squat;Stairs    Examination-Participation Restrictions Cleaning;Community Activity;Laundry;Occupation;Yard Work    Psychologist, educational    PT Frequency 3x / week  PT Duration 8 weeks    PT Treatment/Interventions ADLs/Self Care Home Management;Biofeedback;Gait training;Stair training;Functional mobility training;Therapeutic activities;Therapeutic exercise;Balance training;Neuromuscular re-education;Patient/family education;Manual techniques;Manual lymph drainage;Scar mobilization;Passive range of motion;Energy conservation;Taping;Vasopneumatic Device;Joint Manipulations    PT Next Visit Plan   Patient scheduled for manipulation Thursday 4/6   Consulted and Agree with Plan of Care Patient             11:43 AM, 05/17/21 Zuhair Lariccia Small Brylea Pita MPT [facility] [place] [phone] Ph:[phone]

## 2021-05-18 ENCOUNTER — Encounter (HOSPITAL_BASED_OUTPATIENT_CLINIC_OR_DEPARTMENT_OTHER)
Admission: RE | Admit: 2021-05-18 | Discharge: 2021-05-18 | Disposition: A | Discharge status: Home / Self Care | Payer: BC Managed Care – PPO | Source: Ambulatory Visit | Attending: Orthopaedic Surgery | Admitting: Orthopaedic Surgery

## 2021-05-18 DIAGNOSIS — Z01812 Encounter for preprocedural laboratory examination: Secondary | ICD-10-CM | POA: Insufficient documentation

## 2021-05-18 DIAGNOSIS — K219 Gastro-esophageal reflux disease without esophagitis: Secondary | ICD-10-CM | POA: Diagnosis not present

## 2021-05-18 DIAGNOSIS — E119 Type 2 diabetes mellitus without complications: Secondary | ICD-10-CM | POA: Diagnosis not present

## 2021-05-18 DIAGNOSIS — M24661 Ankylosis, right knee: Secondary | ICD-10-CM | POA: Diagnosis not present

## 2021-05-18 DIAGNOSIS — Z96651 Presence of right artificial knee joint: Secondary | ICD-10-CM | POA: Diagnosis not present

## 2021-05-18 DIAGNOSIS — Z7984 Long term (current) use of oral hypoglycemic drugs: Secondary | ICD-10-CM | POA: Diagnosis not present

## 2021-05-18 DIAGNOSIS — I10 Essential (primary) hypertension: Secondary | ICD-10-CM | POA: Diagnosis not present

## 2021-05-18 LAB — BASIC METABOLIC PANEL
Anion gap: 12 (ref 5–15)
BUN: 14 mg/dL (ref 6–20)
CO2: 24 mmol/L (ref 22–32)
Calcium: 9.8 mg/dL (ref 8.9–10.3)
Chloride: 103 mmol/L (ref 98–111)
Creatinine, Ser: 0.97 mg/dL (ref 0.44–1.00)
GFR, Estimated: >60 mL/min (ref >60)
Glucose, Bld: 171 mg/dL — ABNORMAL HIGH (ref 70–99)
Potassium: 4.5 mmol/L (ref 3.5–5.1)
Sodium: 139 mmol/L (ref 135–145)

## 2021-05-18 NOTE — Progress Notes (Signed)

## 2021-05-18 NOTE — Therapy (Signed)
OUTPATIENT PHYSICAL THERAPY TREATMENT NOTE   Patient Name: Carrie [name] MRN: [id number] DOB:14-Oct-1970, 51 y.o., female Today's Date: 05/19/2021  PCP: Ladon Applebaum REFERRING PROVIDER: Magnus Ivan   PT End of Session - 05/19/21 1117     Visit Number 14    Number of Visits 24    Date for PT Re-Evaluation 06/19/21    Authorization Type BCBS no auth needed    PT Start Time 1119    PT Stop Time 1200    PT Time Calculation (min) 41 min    Activity Tolerance Patient tolerated treatment well;No increased pain    Behavior During Therapy WFL for tasks assessed/performed                       Past Medical History:  Diagnosis Date   Arthritis    Diabetes mellitus without complication (HCC)    Essential hypertension    GERD (gastroesophageal reflux disease)    Mixed hyperlipidemia    PONV (postoperative nausea and vomiting)    Past Surgical History:  Procedure Laterality Date   ABDOMINAL HYSTERECTOMY     HARDWARE REMOVAL Right 04/02/2021   Procedure: Right Knee HARDWARE REMOVAL;  Surgeon: Kathryne Hitch, MD;  Location: [facility];  Service: Orthopedics;  Laterality: Right;   KNEE ARTHROSCOPY  2009   MASS EXCISION  02/11/2011   Procedure: EXCISION MASS;  Surgeon: Kerrin Champagne, MD;  Location: [facility];  Service: Orthopedics;  Laterality: Right;  resection of neuroma right infrapatellar medial knee   PATELLA ARTHROPLASTY     rt knee-   TOTAL KNEE ARTHROPLASTY  2011   partial-[place] reg    TOTAL KNEE ARTHROPLASTY Right 04/02/2021   Procedure: Right TOTAL KNEE ARTHROPLASTY;  Surgeon: Kathryne Hitch, MD;  Location: [facility];  Service: Orthopedics;  Laterality: Right;   TUBAL LIGATION     Patient Active Problem List   Diagnosis Date Noted   Pain in right knee 04/19/2021   Stiffness of right knee, not elsewhere classified 04/19/2021   Difficulty in walking, not elsewhere classified 04/19/2021   Status post total knee  replacement, right 04/02/2021   Allergic rhinitis due to pollen 06/22/2020   Eosinophilic esophagitis 06/22/2020   Rash and other nonspecific skin eruption 06/22/2020   Unilateral primary osteoarthritis, right knee 10/09/2017   AC joint arthropathy 01/04/2017   Acute pain of right shoulder 11/15/2016   Other derangements of patella, unspecified knee 09/02/2015   Recurrent dislocation of right patella 09/02/2015   Breast hypertrophy in female 05/13/2015   Neuroma of lower extremity 02/11/2011    Class: Chronic    REFERRING DIAG: Right TKA  04/02/21 surgery date  THERAPY DIAG:  Unilateral primary osteoarthritis, right knee  Difficulty in walking, not elsewhere classified  Stiffness of right knee, not elsewhere classified  Status post total knee replacement, right  Chronic pain of right knee  PERTINENT HISTORY: Prior right partial replacement 12+ years ago  PRECAUTIONS: none  SUBJECTIVE:  Patient scheduled for a manipulation on Thursday 4/6 per Dr. Rayburn Ma; she did not take her pain medication today because it made her sick last time she took it.   PAIN:  Are you having pain? No just stiffness      TODAY'S TREATMENT:  05/19/21 Bike  1/2 revolutions 5 min seat 6 for ROM Calf stretch slant board 3 x 30" Heel raise x 20 Step up 8 inch HHA x 1,  2 x 10 TKE 3 plates 3  x 10 Standing hip abduction 2 x 10; 5 #  Partial squats 2 x 10  Knee driver on 12 inch box 5 x 10"   Seated hamstring stretch for knee extension 5 x 20" Prone hang x 5', 5#   Manual Knee flexion and extension x 10 each  AAROM Right knee extension -2 today in supine    05/17/21 Bike  1/2 revolutions 5 min seat 6 for ROM Calf stretch slant board 3 x 30" Heel raise x 20 Step up 8 inch HHA x 1,  2 x 10 TKE 3 plates 3 x 10 Standing hip abduction 2 x 10; 4 #  Partial squats 2 x 10  Knee driver on 12 inch box 5 x 10"   Seated hamstring stretch for knee extension 5 x 20"  Manual Knee flexion and  extension x 10 each   05/14/21 Bike 1/2 revolutions 5 min seat 6  for ROM  Calf stretch slant board 3 x 30" Heel raise x 20 Step up 4 inch HHA x 1,  2 x 10 TKE 2 plates 3 x 10 Standing hip abduction 2 x 10; 3#  Partial squats 2 x 10  Knee driver on 12 inch box 5 x 10"   Seated hamstring stretch for knee extension 5 x 20"   05/12/21 Bike 1/2 revolutions 5 min seat 6  for ROM  Calf stretch slant board 3 x 30" Heel raise x 20 Step up 4 inch HHA x 1,  2 x 10 TKE 2 plates 3 x 10 Standing hip abduction 2 x 10; 3#  Partial squats 2 x 10  Knee driver on 12 inch box 5 x 10"   Seated hamstring stretch for knee extension 5 x 20"  Right knee AROM ext in Supine -10, AAROM ext supine -4  Right knee AROM in sitting KF 85 degrees, 92 AAROM     PATIENT EDUCATION: Education details: Exercise form and function, updated HEP and increased stretching frequency  Person educated: Patient Education method: Medical illustrator Education comprehension: verbalized understanding  HOME EXERCISE PROGRAM: Exercises - Supine Heel Slide with Strap  - 4-5 x daily - 7 x weekly - 2 sets - 10 reps - 10 second hold - Supine Knee Extension Mobilization with Weight  - 4-5 x daily - 7 x weekly - 1 sets - 1 reps - 3-5 minutes holdAccess Code: [email or web address] Exercises Prone Knee Extension Hang - 3 x daily - 7 x weekly - 1 sets - 1 reps - 1-3 min hold    Exercises Supine Heel Slides - 3-5 x daily - 7 x weekly - 2 sets - 20 reps Supine Single Leg Ankle Pumps - 3-5 x daily - 7 x weekly - 2 sets - 20 reps Supine Quad Set (Mirrored) - 3 x daily - 7 x weekly - 2 sets - 20 reps Supine Short Arc Quad (Mirrored) - 3 x daily - 7 x weekly - 2 sets - 10 reps Supine Active Straight Leg Raise - 3 x daily - 7 x weekly - 2 sets - 10 reps Supine Hip Abduction - 3 x daily - 7 x weekly - 2 sets - 20 reps Seated Knee Flexion AAROM - 3 x daily - 7 x weekly - 2 sets - 10 reps - 5 seconds hold Seated Long Arc Quad - 3 x daily  - 7 x weekly - 2 sets - 10 reps  Exercises Standing Gastroc Stretch on Step - 2 x daily -  7 x weekly - 5 sets - 1 reps - 20 hold Standing Knee Flexion Stretch on Step - 2 x daily - 7 x weekly - 2 sets - 10 reps Standing Hamstring Stretch with Step - 2 x daily - 7 x weekly - 1 sets - 5 reps - 20 hold    PT Short Term Goals -       PT SHORT TERM GOAL #1   Title Patient will be able to improve right knee AROM to 0 degrees extension to improve gait mechanics and standing safety.    Baseline 05/12/21:-10 AROM,  -4 deg extension aAROM    Time 1    Period Months    Status New    Target Date 05/20/21      PT SHORT TERM GOAL #2   Title Patient will be able to improve right knee flexion AROM to 80 degrees to improve function.    Baseline 04/26/21 AAROM R knee 92 degrees in sitting today.    Time 1    Period Months    Status Met 05/10/21   Target Date 05/20/21      PT SHORT TERM GOAL #3   Title Patient will be able to ambulate without use of front wheel walker throughout home for improved ADLs.    Baseline 04/19/21: Patient currently using front wheel walker consistently    Time 1    Period Months    Status Met 05/12/21   Target Date 05/20/21              PT Long Term Goals       PT LONG TERM GOAL #1   Title Patient will be able to get to 0 degrees extension and at least 110 degrees flexion for right knee AROM for proper mechanics.    Baseline 04/19/21: -14 deg extension and 62 degrees flexion end of session AROM    Time 2    Period Months    Status New    Target Date 06/19/21      PT LONG TERM GOAL #2   Title Patient will be able to ambulate in community over 30 minutes without assistive devices and with good form.    Baseline 04/19/21: using front wheeled walker at home and community    Time 2    Period Months    Status New    Target Date 06/19/21      PT LONG TERM GOAL #3   Title Patient will improve FOTO score to at least 69 degrees to show improved functional abilities     Baseline 04/19/21: 55 today    Time 2    Period Months    Status New    Target Date 06/19/21              Plan -     Clinical Impression Statement Today's session continues to address improving patient's mobility and Right knee ROM.  She is preparing for her manipulation tomorrow.  Increased hip Abduction to 5#,  Patient will continue to benefit from skilled therapy services to reduce remaining deficits and improve functional ability to prepare for manipulation tomorrow.      Personal Factors and Comorbidities Past/Current Experience;Time since onset of injury/illness/exacerbation    Examination-Activity Limitations Locomotion Level;Carry;Dressing;Hygiene/Grooming;Lift;Stand;Squat;Stairs    Examination-Participation Restrictions Cleaning;Community Activity;Laundry;Occupation;Yard Work    [employer]    PT Frequency 3x / week    PT Duration 8 weeks    PT Treatment/Interventions ADLs/Self Care Home Management;Biofeedback;Gait training;Stair training;Functional  mobility training;Therapeutic activities;Therapeutic exercise;Balance training;Neuromuscular re-education;Patient/family education;Manual techniques;Manual lymph drainage;Scar mobilization;Passive range of motion;Energy conservation;Taping;Vasopneumatic Device;Joint Manipulations    PT Next Visit Plan   Patient scheduled for manipulation tomorrow, Thursday 4/6   Consulted and Agree with Plan of Care Patient             11:59 AM, 05/19/21 Nayquan Evinger Small Ariatna Jester MPT [facility] [place] [phone] Ph:[phone]

## 2021-05-19 ENCOUNTER — Encounter (HOSPITAL_COMMUNITY): Payer: Self-pay

## 2021-05-19 ENCOUNTER — Ambulatory Visit (HOSPITAL_COMMUNITY): Payer: BC Managed Care – PPO

## 2021-05-19 DIAGNOSIS — G8929 Other chronic pain: Secondary | ICD-10-CM | POA: Diagnosis not present

## 2021-05-19 DIAGNOSIS — Z96651 Presence of right artificial knee joint: Secondary | ICD-10-CM

## 2021-05-19 DIAGNOSIS — M25661 Stiffness of right knee, not elsewhere classified: Secondary | ICD-10-CM

## 2021-05-19 DIAGNOSIS — M1711 Unilateral primary osteoarthritis, right knee: Secondary | ICD-10-CM | POA: Diagnosis not present

## 2021-05-19 DIAGNOSIS — M25561 Pain in right knee: Secondary | ICD-10-CM | POA: Diagnosis not present

## 2021-05-19 DIAGNOSIS — R262 Difficulty in walking, not elsewhere classified: Secondary | ICD-10-CM | POA: Diagnosis not present

## 2021-05-20 ENCOUNTER — Encounter (HOSPITAL_BASED_OUTPATIENT_CLINIC_OR_DEPARTMENT_OTHER): Payer: Self-pay | Admitting: Orthopaedic Surgery

## 2021-05-20 ENCOUNTER — Ambulatory Visit (HOSPITAL_BASED_OUTPATIENT_CLINIC_OR_DEPARTMENT_OTHER)
Admission: RE | Admit: 2021-05-20 | Discharge: 2021-05-20 | Disposition: A | Discharge status: Home / Self Care | Payer: BC Managed Care – PPO | Attending: Orthopaedic Surgery | Admitting: Orthopaedic Surgery

## 2021-05-20 ENCOUNTER — Other Ambulatory Visit: Payer: Self-pay

## 2021-05-20 ENCOUNTER — Ambulatory Visit (HOSPITAL_BASED_OUTPATIENT_CLINIC_OR_DEPARTMENT_OTHER): Payer: BC Managed Care – PPO | Admitting: Anesthesiology

## 2021-05-20 ENCOUNTER — Encounter (HOSPITAL_BASED_OUTPATIENT_CLINIC_OR_DEPARTMENT_OTHER)
Admission: RE | Disposition: A | Discharge status: Home / Self Care | Payer: Self-pay | Source: Home / Self Care | Attending: Orthopaedic Surgery

## 2021-05-20 DIAGNOSIS — M24661 Ankylosis, right knee: Secondary | ICD-10-CM | POA: Diagnosis not present

## 2021-05-20 DIAGNOSIS — Z7984 Long term (current) use of oral hypoglycemic drugs: Secondary | ICD-10-CM | POA: Diagnosis not present

## 2021-05-20 DIAGNOSIS — I10 Essential (primary) hypertension: Secondary | ICD-10-CM | POA: Diagnosis not present

## 2021-05-20 DIAGNOSIS — E119 Type 2 diabetes mellitus without complications: Secondary | ICD-10-CM | POA: Insufficient documentation

## 2021-05-20 DIAGNOSIS — K219 Gastro-esophageal reflux disease without esophagitis: Secondary | ICD-10-CM | POA: Diagnosis not present

## 2021-05-20 DIAGNOSIS — Z96651 Presence of right artificial knee joint: Secondary | ICD-10-CM | POA: Insufficient documentation

## 2021-05-20 DIAGNOSIS — T8482XD Fibrosis due to internal orthopedic prosthetic devices, implants and grafts, subsequent encounter: Secondary | ICD-10-CM

## 2021-05-20 HISTORY — DX: Type 2 diabetes mellitus without complications: E11.9

## 2021-05-20 HISTORY — PX: KNEE CLOSED REDUCTION: SHX995

## 2021-05-20 HISTORY — DX: Fibrosis due to internal orthopedic prosthetic devices, implants and grafts, subsequent encounter: T84.82XD

## 2021-05-20 LAB — GLUCOSE, CAPILLARY
Glucose-Capillary: 113 mg/dL — ABNORMAL HIGH (ref 70–99)
Glucose-Capillary: 97 mg/dL (ref 70–99)

## 2021-05-20 SURGERY — MANIPULATION, KNEE, CLOSED
Anesthesia: General | Site: Knee | Laterality: Right

## 2021-05-20 MED ORDER — MIDAZOLAM HCL 2 MG/2ML IJ SOLN
INTRAMUSCULAR | Status: AC
  Filled 2021-05-20: qty 2

## 2021-05-20 MED ORDER — OXYCODONE HCL 5 MG/5ML PO SOLN: 5.0000 mg | Freq: Once | ORAL | Status: DC | PRN

## 2021-05-20 MED ORDER — BUPIVACAINE HCL (PF) 0.25 % IJ SOLN
INTRAMUSCULAR | Status: DC | PRN
  Administered 2021-05-20: 4 mL

## 2021-05-20 MED ORDER — DEXAMETHASONE SODIUM PHOSPHATE 10 MG/ML IJ SOLN
INTRAMUSCULAR | Status: AC
  Filled 2021-05-20: qty 1

## 2021-05-20 MED ORDER — PROPOFOL 10 MG/ML IV BOLUS
INTRAVENOUS | Status: DC | PRN
  Administered 2021-05-20: 20 mg via INTRAVENOUS
  Administered 2021-05-20: 100 mg via INTRAVENOUS

## 2021-05-20 MED ORDER — PROPOFOL 10 MG/ML IV BOLUS
INTRAVENOUS | Status: AC
  Filled 2021-05-20: qty 20

## 2021-05-20 MED ORDER — LACTATED RINGERS IV SOLN: INTRAVENOUS | Status: DC

## 2021-05-20 MED ORDER — KETOROLAC TROMETHAMINE 30 MG/ML IJ SOLN
INTRAMUSCULAR | Status: DC | PRN
  Administered 2021-05-20: 30 mg via INTRAVENOUS

## 2021-05-20 MED ORDER — LIDOCAINE HCL (CARDIAC) PF 100 MG/5ML IV SOSY
PREFILLED_SYRINGE | INTRAVENOUS | Status: DC | PRN
  Administered 2021-05-20: 40 mg via INTRAVENOUS

## 2021-05-20 MED ORDER — LIDOCAINE 2% (20 MG/ML) 5 ML SYRINGE
INTRAMUSCULAR | Status: AC
  Filled 2021-05-20: qty 5

## 2021-05-20 MED ORDER — KETOROLAC TROMETHAMINE 30 MG/ML IJ SOLN
INTRAMUSCULAR | Status: AC
Start: 2021-05-20 — End: ?
  Filled 2021-05-20: qty 1

## 2021-05-20 MED ORDER — FENTANYL CITRATE (PF) 100 MCG/2ML IJ SOLN
INTRAMUSCULAR | Status: AC
  Filled 2021-05-20: qty 2

## 2021-05-20 MED ORDER — ACETAMINOPHEN 500 MG PO TABS
ORAL_TABLET | ORAL | Status: AC
  Filled 2021-05-20: qty 2

## 2021-05-20 MED ORDER — ONDANSETRON HCL 4 MG/2ML IJ SOLN
INTRAMUSCULAR | Status: DC | PRN
  Administered 2021-05-20: 4 mg via INTRAVENOUS

## 2021-05-20 MED ORDER — FENTANYL CITRATE (PF) 100 MCG/2ML IJ SOLN: 25.0000 ug | INTRAMUSCULAR | Status: DC | PRN

## 2021-05-20 MED ORDER — METHYLPREDNISOLONE ACETATE 40 MG/ML IJ SUSP
INTRAMUSCULAR | Status: DC | PRN
  Administered 2021-05-20: 1 mL via INTRA_ARTICULAR

## 2021-05-20 MED ORDER — OXYCODONE HCL 5 MG PO TABS: 5.0000 mg | ORAL_TABLET | Freq: Once | ORAL | Status: DC | PRN

## 2021-05-20 MED ORDER — ACETAMINOPHEN 500 MG PO TABS
1000.0000 mg | ORAL_TABLET | Freq: Once | ORAL | Status: AC
Start: 2021-05-20 — End: 2021-05-20
  Administered 2021-05-20: 1000 mg via ORAL

## 2021-05-20 MED ORDER — METHYLPREDNISOLONE ACETATE 40 MG/ML IJ SUSP
INTRAMUSCULAR | Status: AC
  Filled 2021-05-20: qty 1

## 2021-05-20 MED ORDER — ONDANSETRON HCL 4 MG/2ML IJ SOLN
INTRAMUSCULAR | Status: AC
Start: 2021-05-20 — End: ?
  Filled 2021-05-20: qty 2

## 2021-05-20 SURGICAL SUPPLY — 15 items
BNDG PLASTER X FAST 2X3 WHT LF (CAST SUPPLIES) ×2 IMPLANT
BNDG PLSTR 3X2 XFST ST WHT LF (CAST SUPPLIES) ×2
GLOVE BIOGEL M 8.0 STRL (GLOVE) ×3 IMPLANT
GLOVE SRG 8 PF TXTR STRL LF DI (GLOVE) ×2 IMPLANT
GLOVE SURG ENC MOIS LTX SZ8 (GLOVE) ×3 IMPLANT
GLOVE SURG UNDER POLY LF SZ8 (GLOVE) ×2
GOWN STRL REUS W/ TWL LRG LVL3 (GOWN DISPOSABLE) IMPLANT
GOWN STRL REUS W/ TWL XL LVL3 (GOWN DISPOSABLE) ×4 IMPLANT
GOWN STRL REUS W/TWL LRG LVL3 (GOWN DISPOSABLE)
GOWN STRL REUS W/TWL XL LVL3 (GOWN DISPOSABLE) ×4
IV CATH 18G SAFETY (IV SOLUTION) ×1 IMPLANT
KIT TURNOVER KIT B (KITS) ×3 IMPLANT
NEEDLE HYPO 22GX1.5 SAFETY (NEEDLE) ×1 IMPLANT
PAD ARMBOARD 7.5X6 YLW CONV (MISCELLANEOUS) ×6 IMPLANT
SYR 10ML LL (SYRINGE) ×1 IMPLANT

## 2021-05-20 NOTE — H&P (Signed)
Carrie [name] is an 51 y.o. female.   ?Chief Complaint: Right knee stiffness and decreased motion status post total knee arthroplasty ?HPI:   The patient is a 51 year old female who has a history of a right total knee arthroplasty performed about 6 weeks ago.  At her last visit in the office she had significant decreased motion of the knee and obvious postoperative arthrofibrosis.  She has been attending outpatient physical therapy but has not made good progress with her motion.  Her flexion is less than 90 degrees of the right knee.  At this point recommended a manipulation under anesthesia.  She does have a history of a partial knee replacement that was done years ago.  She did need a manipulation after that procedure as well apparently.  She understands why we are recommending this. ? ?Past Medical History:  ?Diagnosis Date  ? Arthritis   ? Arthrofibrosis of right total knee arthroplasty, subsequent encounter 05/20/2021  ? Diabetes mellitus without complication ([place])   ? Essential hypertension   ? GERD (gastroesophageal reflux disease)   ? Mixed hyperlipidemia   ? PONV (postoperative nausea and vomiting)   ? ? ?Past Surgical History:  ?Procedure Laterality Date  ? ABDOMINAL HYSTERECTOMY    ? HARDWARE REMOVAL Right 04/02/2021  ? Procedure: Right Knee HARDWARE REMOVAL;  Surgeon: Mcarthur Rossetti, MD;  Location: [facility];  Service: Orthopedics;  Laterality: Right;  ? KNEE ARTHROSCOPY  2009  ? MASS EXCISION  02/11/2011  ? Procedure: EXCISION MASS;  Surgeon: Jessy Oto, MD;  Location: [facility];  Service: Orthopedics;  Laterality: Right;  resection of neuroma right infrapatellar medial knee  ? PATELLA ARTHROPLASTY    ? rt knee-  ? TOTAL KNEE ARTHROPLASTY  2011  ? partial-[facility]   ? TOTAL KNEE ARTHROPLASTY Right 04/02/2021  ? Procedure: Right TOTAL KNEE ARTHROPLASTY;  Surgeon: Mcarthur Rossetti, MD;  Location: [facility];  Service: Orthopedics;  Laterality: Right;  ? TUBAL LIGATION     ? ? ?Family History  ?Problem Relation Age of Onset  ? Breast cancer Mother   ? Heart disease Father   ? Prostate cancer Father   ? ?Social History:  reports that she has never smoked. She has never used smokeless tobacco. She reports that she does not drink alcohol and does not use drugs. ? ?Allergies:  ?Allergies  ?Allergen Reactions  ? Codeine Other (See Comments)  ?  Severe headaches ?  ? ? ?Medications Prior to Admission  ?Medication Sig Dispense Refill  ? aspirin 81 MG chewable tablet Chew 1 tablet (81 mg total) by mouth 2 (two) times daily. 30 tablet 0  ? atorvastatin (LIPITOR) 10 MG tablet Take 10 mg by mouth at bedtime.    ? HYDROcodone-acetaminophen (NORCO) 7.5-325 MG tablet Take 1 tablet by mouth every 6 (six) hours as needed for moderate pain. 30 tablet 0  ? ibuprofen (ADVIL) 200 MG tablet Take 800 mg by mouth every 6 (six) hours as needed for moderate pain or headache.    ? losartan (COZAAR) 100 MG tablet Take 100 mg by mouth daily.    ? metFORMIN (GLUCOPHAGE) 1000 MG tablet Take 1,000 mg by mouth 2 (two) times daily.    ? oxyCODONE (OXY IR/ROXICODONE) 5 MG immediate release tablet Take 1-2 tablets (5-10 mg total) by mouth every 4 (four) hours as needed for moderate pain (pain score 4-6). 30 tablet 0  ? pantoprazole (PROTONIX) 40 MG tablet Take 40 mg by mouth daily.    ?  estradiol (CLIMARA - DOSED IN MG/24 HR) 0.1 mg/24hr patch Place 0.1 mg onto the skin every Sunday.    ? methocarbamol (ROBAXIN) 500 MG tablet Take 1 tablet (500 mg total) by mouth every 6 (six) hours as needed for muscle spasms. 40 tablet 0  ? ondansetron (ZOFRAN-ODT) 4 MG disintegrating tablet Take 1 tablet (4 mg total) by mouth every 8 (eight) hours as needed for nausea or vomiting. 20 tablet 0  ? ? ?Results for orders placed or performed during the hospital encounter of 05/20/21 (from the past 48 hour(s))  ?Basic metabolic panel per protocol     Status: Abnormal  ? Collection Time: 05/18/21 12:20 PM  ?Result Value Ref Range  ?  Sodium 139 135 - 145 mmol/L  ? Potassium 4.5 3.5 - 5.1 mmol/L  ? Chloride 103 98 - 111 mmol/L  ? CO2 24 22 - 32 mmol/L  ? Glucose, Bld 171 (H) 70 - 99 mg/dL  ?  Comment: Glucose reference range applies only to samples taken after fasting for at least 8 hours.  ? BUN 14 6 - 20 mg/dL  ? Creatinine, Ser 0.97 0.44 - 1.00 mg/dL  ? Calcium 9.8 8.9 - 10.3 mg/dL  ? GFR, Estimated >60 >60 mL/min  ?  Comment: (NOTE) ?Calculated using the CKD-EPI Creatinine Equation (2021) ?  ? Anion gap 12 5 - 15  ?  Comment: Performed at [facility], [place] [address] [place],  [address]  ?Glucose, capillary     Status: Abnormal  ? Collection Time: 05/20/21  8:05 AM  ?Result Value Ref Range  ? Glucose-Capillary 113 (H) 70 - 99 mg/dL  ?  Comment: Glucose reference range applies only to samples taken after fasting for at least 8 hours.  ? Comment 1 Notify RN   ? Comment 2 Document in Chart   ? ?No results found. ? ?Review of Systems  ?Musculoskeletal:  Positive for gait problem.  ?All other systems reviewed and are negative. ? ?Blood pressure 133/77, pulse 78, temperature 97.7 ?F (36.5 ?C), temperature source Oral, resp. rate 18, height '4\' 11"'$  (1.499 m), weight 66.7 kg, SpO2 100 %. ?Physical Exam ?Vitals reviewed.  ?Constitutional:   ?   Appearance: Normal appearance.  ?HENT:  ?   Head: Normocephalic and atraumatic.  ?Eyes:  ?   Extraocular Movements: Extraocular movements intact.  ?   Pupils: Pupils are equal, round, and reactive to light.  ?Cardiovascular:  ?   Rate and Rhythm: Normal rate.  ?Pulmonary:  ?   Effort: Pulmonary effort is normal.  ?Musculoskeletal:  ?   Cervical back: Normal range of motion.  ?   Right knee: Decreased range of motion.  ?Neurological:  ?   Mental Status: She is alert and oriented to person, place, and time.  ?Psychiatric:     ?   Behavior: Behavior normal.  ?  ? ?Assessment/Plan ?Right knee arthrofibrosis status post a right total knee arthroplasty ? ?She understands that we are proceeding to surgery  today for right knee manipulation under anesthesia.  The risks and benefits of this procedure have been explained in detail and informed consent is obtained.  The right knee has been marked.  She understands the importance of postoperative aggressive therapy and working on range of motion. ? ?Mcarthur Rossetti, MD ?05/20/2021, 8:38 AM ? ? ? ?

## 2021-05-20 NOTE — Discharge Instructions (Addendum)
Increase your activities as comfort allows. ?Work on knee motion. ?Ice as needed for swelling. ? ? ? ? ? ?Post Anesthesia Home Care Instructions ? ?Activity: ?Get plenty of rest for the remainder of the day. A responsible individual must stay with you for 24 hours following the procedure.  ?For the next 24 hours, DO NOT: ?-Drive a car ?-Paediatric nurse ?-Drink alcoholic beverages ?-Take any medication unless instructed by your physician ?-Make any legal decisions or sign important papers. ? ?Meals: ?Start with liquid foods such as gelatin or soup. Progress to regular foods as tolerated. Avoid greasy, spicy, heavy foods. If nausea and/or vomiting occur, drink only clear liquids until the nausea and/or vomiting subsides. Call your physician if vomiting continues. ? ?Special Instructions/Symptoms: ?Your throat may feel dry or sore from the anesthesia or the breathing tube placed in your throat during surgery. If this causes discomfort, gargle with warm salt water. The discomfort should disappear within 24 hours. ? ?If you had a scopolamine patch placed behind your ear for the management of post- operative nausea and/or vomiting: ? ?1. The medication in the patch is effective for 72 hours, after which it should be removed.  Wrap patch in a tissue and discard in the trash. Wash hands thoroughly with soap and water. ?2. You may remove the patch earlier than 72 hours if you experience unpleasant side effects which may include dry mouth, dizziness or visual disturbances. ?3. Avoid touching the patch. Wash your hands with soap and water after contact with the patch. ?    ?

## 2021-05-20 NOTE — Anesthesia Procedure Notes (Signed)
Date/Time: 05/20/2021 9:08 AM ?Performed by: Glory Buff, CRNA ?Pre-anesthesia Checklist: Patient identified, Emergency Drugs available, Suction available and Patient being monitored ?Patient Re-evaluated:Patient Re-evaluated prior to induction ?Oxygen Delivery Method: Circle system utilized ?Preoxygenation: Pre-oxygenation with 100% oxygen ?Induction Type: IV induction ?Ventilation: Mask ventilation without difficulty ?Dental Injury: Teeth and Oropharynx as per pre-operative assessment  ? ? ? ? ?

## 2021-05-20 NOTE — Brief Op Note (Signed)
05/20/2021 ? ?9:20 AM ? ?PATIENT:  Carrie [name]  51 y.o. female ? ?PRE-OPERATIVE DIAGNOSIS:  arthrofibrosis right total knee ? ?POST-OPERATIVE DIAGNOSIS:  arthrofibrosis right total knee ? ?PROCEDURE:  Procedure(s): ?CLOSED MANIPULATION RIGHT KNEE (Right) ? ?SURGEON:  Surgeon(s) and Role: ?   Mcarthur Rossetti, MD - Primary ? ?ANESTHESIA:   local and IV sedation ? ?PLAN OF CARE: Discharge to home after PACU ? ?PATIENT DISPOSITION:  PACU - hemodynamically stable. ?  ?Delay start of Pharmacological VTE agent (>24hrs) due to surgical blood loss or risk of bleeding: not applicable ? ?

## 2021-05-20 NOTE — Op Note (Signed)
NAME: Holford, Sharmeka R. ?MEDICAL RECORD NO: [id number] ?ACCOUNT NO: [id number] ?DATE OF BIRTH: 23-Aug-1970 ?FACILITY: [facility] ?LOCATION: [facility] ?PHYSICIAN: Lind Guest. Ninfa Linden, MD ? ?Operative Report  ? ?DATE OF PROCEDURE: 05/20/2021 ? ?PREOPERATIVE DIAGNOSIS:  Right knee arthrofibrosis, status post total knee arthroplasty. ? ?POSTOPERATIVE DIAGNOSIS:  Right knee arthrofibrosis, status post total knee arthroplasty. ? ?PROCEDURE:  Right knee manipulation under anesthesia. ? ?SURGEON:  Lind Guest. Ninfa Linden, MD ? ?ANESTHESIA:   ?1.  Mask ventilation, IV sedation. ?2.  Local with mixture of 4 mL of plain Marcaine with 1 mL of 40 mg of Depo-Medrol. ? ?ESTIMATED BLOOD LOSS:  Minimal. ? ?COMPLICATIONS:  None. ? ?INDICATIONS:  The patient is a 51 year old female who 6 weeks ago underwent a right total knee arthroplasty.  She had actually had a previous unicompartmental patellofemoral joint replacement done by someone else about 12 years earlier.  She then  ?developed significant arthritis in the medial and lateral compartments of the knee, necessitating revision to a total knee arthroplasty.  She did let me know, even after the patellofemoral arthroplasty, she needed a manipulation under anesthesia.  At her ? 6-week followup visit she was still not even flexing close to 90 degrees.  She has been diligent with going to physical therapy and at this point, understands our recommendation for manipulation under anesthesia.  I described the risks and benefits of  ?this to her including the risk for fracture. ? ?DESCRIPTION OF PROCEDURE:  After informed consent was obtained, appropriate right knee was marked.  She was brought to the operating room and kept on the stretcher.  Mask ventilation, IV sedation was obtained.  A timeout was called and she was identified ? as correct patient, correct right knee.  I was then able to gently manipulate the knee.  She has full extension and her flexion was short of 90 degrees, but after  manipulating I was able to take a photograph of the knee showing her that her knee was  ?flexed to 120+ degrees.  I put her through several cycles of motion and then cleaned the knee with Betadine and alcohol and placed the Marcaine, Depo-Medrol mixture of the knee joint.  Band-Aid was placed over this.  She was awakened and taken to  ?recovery room in stable condition.  Postoperatively, she will start physical therapy again tomorrow and have it all through next week and probably next 4-6 weeks following that. ? ? ?PUS ?D: 05/20/2021 9:17:38 am T: 05/20/2021 2:48:00 pm  ?JOB: [id number]/ [id number]  ?

## 2021-05-20 NOTE — Anesthesia Postprocedure Evaluation (Signed)
Anesthesia Post Note ? ?Patient: Carrie [name] ? ?Procedure(s) Performed: CLOSED MANIPULATION RIGHT KNEE (Right: Knee) ? ?  ? ?Patient location during evaluation: PACU ?Anesthesia Type: General ?Level of consciousness: awake and alert ?Pain management: pain level controlled ?Vital Signs Assessment: post-procedure vital signs reviewed and stable ?Respiratory status: spontaneous breathing, nonlabored ventilation and respiratory function stable ?Cardiovascular status: blood pressure returned to baseline ?Postop Assessment: no apparent nausea or vomiting ?Anesthetic complications: no ? ? ?No notable events documented. ? ?Last Vitals:  ?Vitals:  ? 05/20/21 0956 05/20/21 0957  ?BP: (!) 144/65   ?Pulse: 61 65  ?Resp: 11 11  ?Temp:    ?SpO2: 98% 97%  ?  ?Last Pain:  ?Vitals:  ? 05/20/21 0919  ?TempSrc:   ?PainSc: Asleep  ? ? ?  ?  ?  ?  ?  ?  ? ?Marthenia Rolling ? ? ? ? ?

## 2021-05-20 NOTE — Anesthesia Preprocedure Evaluation (Addendum)
Anesthesia Evaluation  ?Patient identified by MRN, date of birth, ID band ?Patient awake ? ? ? ?Reviewed: ?Allergy & Precautions, NPO status , Patient's Chart, lab work & pertinent test results ? ?History of Anesthesia Complications ?(+) PONV and history of anesthetic complications ? ?Airway ?Mallampati: II ? ?TM Distance: >3 FB ?Neck ROM: Full ? ? ? Dental ?no notable dental hx. ? ?  ?Pulmonary ?neg pulmonary ROS,  ?  ?Pulmonary exam normal ? ? ? ? ? ? ? Cardiovascular ?hypertension, Pt. on medications ?Normal cardiovascular exam ? ? ?  ?Neuro/Psych ?negative neurological ROS ? negative psych ROS  ? GI/Hepatic ?Neg liver ROS, GERD  Controlled and Medicated,  ?Endo/Other  ?diabetes, Type 2, Oral Hypoglycemic Agents ? Renal/GU ?negative Renal ROS  ?negative genitourinary ?  ?Musculoskeletal ? ?(+) Arthritis , arthrofibrosis right total knee  ? Abdominal ?  ?Peds ? Hematology ?negative hematology ROS ?(+)   ?Anesthesia Other Findings ?Day of surgery medications reviewed with patient. ? Reproductive/Obstetrics ?negative OB ROS ? ?  ? ? ? ? ? ? ? ? ? ? ? ? ? ?  ?  ? ? ? ? ? ? ? ?Anesthesia Physical ?Anesthesia Plan ? ?ASA: 2 ? ?Anesthesia Plan: General  ? ?Post-op Pain Management: Tylenol PO (pre-op)* and Toradol IV (intra-op)*  ? ?Induction:  ? ?PONV Risk Score and Plan: 4 or greater and Midazolam, Treatment may vary due to age or medical condition and Propofol infusion ? ?Airway Management Planned: Mask ? ?Additional Equipment: None ? ?Intra-op Plan:  ? ?Post-operative Plan:  ? ?Informed Consent: I have reviewed the patients History and Physical, chart, labs and discussed the procedure including the risks, benefits and alternatives for the proposed anesthesia with the patient or authorized representative who has indicated his/her understanding and acceptance.  ? ? ? ? ? ?Plan Discussed with: CRNA ? ?Anesthesia Plan Comments:   ? ? ? ? ? ?Anesthesia Quick Evaluation ? ?

## 2021-05-20 NOTE — Transfer of Care (Signed)
Immediate Anesthesia Transfer of Care Note ? ?Patient: Carrie [name] ? ?Procedure(s) Performed: CLOSED MANIPULATION RIGHT KNEE (Right: Knee) ? ?Patient Location: PACU ? ?Anesthesia Type:General ? ?Level of Consciousness: awake, alert  and oriented ? ?Airway & Oxygen Therapy: Patient Spontanous Breathing and Patient connected to face mask oxygen ? ?Post-op Assessment: Report given to RN and Post -op Vital signs reviewed and stable ? ?Post vital signs: Reviewed and stable ? ?Last Vitals:  ?Vitals Value Taken Time  ?BP 140/75 05/20/21 0918  ?Temp    ?Pulse 67 05/20/21 0924  ?Resp 16 05/20/21 0924  ?SpO2 100 % 05/20/21 0924  ?Vitals shown include unvalidated device data. ? ?Last Pain:  ?Vitals:  ? 05/20/21 0804  ?TempSrc: Oral  ?PainSc: 0-No pain  ?   ? ?  ? ?Complications: No notable events documented. ?

## 2021-05-21 ENCOUNTER — Ambulatory Visit (HOSPITAL_COMMUNITY): Payer: BC Managed Care – PPO

## 2021-05-21 DIAGNOSIS — Z96651 Presence of right artificial knee joint: Secondary | ICD-10-CM | POA: Diagnosis not present

## 2021-05-21 DIAGNOSIS — M25561 Pain in right knee: Secondary | ICD-10-CM | POA: Diagnosis not present

## 2021-05-21 DIAGNOSIS — M25661 Stiffness of right knee, not elsewhere classified: Secondary | ICD-10-CM

## 2021-05-21 DIAGNOSIS — M1711 Unilateral primary osteoarthritis, right knee: Secondary | ICD-10-CM | POA: Diagnosis not present

## 2021-05-21 DIAGNOSIS — R262 Difficulty in walking, not elsewhere classified: Secondary | ICD-10-CM | POA: Diagnosis not present

## 2021-05-21 DIAGNOSIS — G8929 Other chronic pain: Secondary | ICD-10-CM | POA: Diagnosis not present

## 2021-05-21 NOTE — Therapy (Signed)
OUTPATIENT PHYSICAL THERAPY TREATMENT NOTE   Patient Name: Carrie [name] MRN: [id number] DOB:07/25/1970, 51 y.o., female Today's Date: 05/21/2021  PCP: Ladon Applebaum REFERRING PROVIDER: Magnus Ivan   PT End of Session - 05/21/21 1153     Visit Number 15    Number of Visits 24    Date for PT Re-Evaluation 06/19/21    Authorization Type BCBS no auth needed    PT Start Time 1120    Activity Tolerance Patient tolerated treatment well;No increased pain    Behavior During Therapy [facility] for tasks assessed/performed                        Past Medical History:  Diagnosis Date   Arthritis    Arthrofibrosis of right total knee arthroplasty, subsequent encounter 05/20/2021   Diabetes mellitus without complication (HCC)    Essential hypertension    GERD (gastroesophageal reflux disease)    Mixed hyperlipidemia    PONV (postoperative nausea and vomiting)    Past Surgical History:  Procedure Laterality Date   ABDOMINAL HYSTERECTOMY     HARDWARE REMOVAL Right 04/02/2021   Procedure: Right Knee HARDWARE REMOVAL;  Surgeon: Kathryne Hitch, MD;  Location: [facility];  Service: Orthopedics;  Laterality: Right;   KNEE ARTHROSCOPY  2009   KNEE CLOSED REDUCTION Right 05/20/2021   Procedure: CLOSED MANIPULATION RIGHT KNEE;  Surgeon: Kathryne Hitch, MD;  Location: [facility];  Service: Orthopedics;  Laterality: Right;   MASS EXCISION  02/11/2011   Procedure: EXCISION MASS;  Surgeon: Kerrin Champagne, MD;  Location: [facility];  Service: Orthopedics;  Laterality: Right;  resection of neuroma right infrapatellar medial knee   PATELLA ARTHROPLASTY     rt knee-   TOTAL KNEE ARTHROPLASTY  2011   partial-[place] reg    TOTAL KNEE ARTHROPLASTY Right 04/02/2021   Procedure: Right TOTAL KNEE ARTHROPLASTY;  Surgeon: Kathryne Hitch, MD;  Location: [facility];  Service: Orthopedics;  Laterality: Right;   TUBAL LIGATION     Patient Active  Problem List   Diagnosis Date Noted   Arthrofibrosis of right total knee arthroplasty, subsequent encounter 05/20/2021   Pain in right knee 04/19/2021   Stiffness of right knee, not elsewhere classified 04/19/2021   Difficulty in walking, not elsewhere classified 04/19/2021   Status post total knee replacement, right 04/02/2021   Allergic rhinitis due to pollen 06/22/2020   Eosinophilic esophagitis 06/22/2020   Rash and other nonspecific skin eruption 06/22/2020   Unilateral primary osteoarthritis, right knee 10/09/2017   AC joint arthropathy 01/04/2017   Acute pain of right shoulder 11/15/2016   Other derangements of patella, unspecified knee 09/02/2015   Recurrent dislocation of right patella 09/02/2015   Breast hypertrophy in female 05/13/2015   Neuroma of lower extremity 02/11/2011    Class: Chronic    REFERRING DIAG: Right TKA  04/02/21 surgery date, manipulation 05/20/21  THERAPY DIAG:  Unilateral primary osteoarthritis, right knee  Difficulty in walking, not elsewhere classified  Stiffness of right knee, not elsewhere classified  Status post total knee replacement, right  Chronic pain of right knee  PERTINENT HISTORY: Prior right partial replacement 12+ years ago  PRECAUTIONS: none  SUBJECTIVE:  Patient had her manipulation yesterday; very sore  PAIN:  Are you having pain? Yes, 6/10 soreness     TODAY'S TREATMENT:  05/21/21 STW and manual Right knee flexion and extension x 25 min; all manual interventions performed independently of other interventions.  Bike x 15 min; patient able to make full forward revolution on bike today  Right knee AROM knee flexion in sitting 100 degrees, 105 degrees AAROM    05/19/21 Bike  1/2 revolutions 5 min seat 6 for ROM Calf stretch slant board 3 x 30" Heel raise x 20 Step up 8 inch HHA x 1,  2 x 10 TKE 3 plates 3 x 10 Standing hip abduction 2 x 10; 5 #  Partial squats 2 x 10  Knee driver on 12 inch box 5 x 10"   Seated  hamstring stretch for knee extension 5 x 20" Prone hang x 5', 5#   Manual Knee flexion and extension x 10 each  AAROM Right knee extension -2 today in supine    05/17/21 Bike  1/2 revolutions 5 min seat 6 for ROM Calf stretch slant board 3 x 30" Heel raise x 20 Step up 8 inch HHA x 1,  2 x 10 TKE 3 plates 3 x 10 Standing hip abduction 2 x 10; 4 #  Partial squats 2 x 10  Knee driver on 12 inch box 5 x 10"   Seated hamstring stretch for knee extension 5 x 20"  Manual Knee flexion and extension x 10 each   05/14/21 Bike 1/2 revolutions 5 min seat 6  for ROM  Calf stretch slant board 3 x 30" Heel raise x 20 Step up 4 inch HHA x 1,  2 x 10 TKE 2 plates 3 x 10 Standing hip abduction 2 x 10; 3#  Partial squats 2 x 10  Knee driver on 12 inch box 5 x 10"   Seated hamstring stretch for knee extension 5 x 20"   05/12/21 Bike 1/2 revolutions 5 min seat 6  for ROM  Calf stretch slant board 3 x 30" Heel raise x 20 Step up 4 inch HHA x 1,  2 x 10 TKE 2 plates 3 x 10 Standing hip abduction 2 x 10; 3#  Partial squats 2 x 10  Knee driver on 12 inch box 5 x 10"   Seated hamstring stretch for knee extension 5 x 20"  Right knee AROM ext in Supine -10, AAROM ext supine -4  Right knee AROM in sitting KF 85 degrees, 92 AAROM     PATIENT EDUCATION: Education details: Exercise form and function, updated HEP and increased stretching frequency  Person educated: Patient Education method: Medical illustrator Education comprehension: verbalized understanding  HOME EXERCISE PROGRAM: Exercises - Supine Heel Slide with Strap  - 4-5 x daily - 7 x weekly - 2 sets - 10 reps - 10 second hold - Supine Knee Extension Mobilization with Weight  - 4-5 x daily - 7 x weekly - 1 sets - 1 reps - 3-5 minutes holdAccess Code: [email or web address] Exercises Prone Knee Extension Hang - 3 x daily - 7 x weekly - 1 sets - 1 reps - 1-3 min hold    Exercises Supine Heel Slides - 3-5 x daily - 7 x weekly - 2  sets - 20 reps Supine Single Leg Ankle Pumps - 3-5 x daily - 7 x weekly - 2 sets - 20 reps Supine Quad Set (Mirrored) - 3 x daily - 7 x weekly - 2 sets - 20 reps Supine Short Arc Quad (Mirrored) - 3 x daily - 7 x weekly - 2 sets - 10 reps Supine Active Straight Leg Raise - 3 x daily - 7 x weekly - 2 sets - 10 reps Supine  Hip Abduction - 3 x daily - 7 x weekly - 2 sets - 20 reps Seated Knee Flexion AAROM - 3 x daily - 7 x weekly - 2 sets - 10 reps - 5 seconds hold Seated Long Arc Quad - 3 x daily - 7 x weekly - 2 sets - 10 reps  Exercises Standing Gastroc Stretch on Step - 2 x daily - 7 x weekly - 5 sets - 1 reps - 20 hold Standing Knee Flexion Stretch on Step - 2 x daily - 7 x weekly - 2 sets - 10 reps Standing Hamstring Stretch with Step - 2 x daily - 7 x weekly - 1 sets - 5 reps - 20 hold    PT Short Term Goals -       PT SHORT TERM GOAL #1   Title Patient will be able to improve right knee AROM to 0 degrees extension to improve gait mechanics and standing safety.    Baseline 05/12/21:-10 AROM,  -4 deg extension aAROM    Time 1    Period Months    Status New    Target Date 05/20/21      PT SHORT TERM GOAL #2   Title Patient will be able to improve right knee flexion AROM to 80 degrees to improve function.    Baseline 04/26/21 AAROM R knee 92 degrees in sitting today.    Time 1    Period Months    Status Met 05/10/21   Target Date 05/20/21      PT SHORT TERM GOAL #3   Title Patient will be able to ambulate without use of front wheel walker throughout home for improved ADLs.    Baseline 04/19/21: Patient currently using front wheel walker consistently    Time 1    Period Months    Status Met 05/12/21   Target Date 05/20/21              PT Long Term Goals       PT LONG TERM GOAL #1   Title Patient will be able to get to 0 degrees extension and at least 110 degrees flexion for right knee AROM for proper mechanics.    Baseline 04/19/21: -14 deg extension and 62 degrees  flexion end of session AROM    Time 2    Period Months    Status New    Target Date 06/19/21      PT LONG TERM GOAL #2   Title Patient will be able to ambulate in community over 30 minutes without assistive devices and with good form.    Baseline 04/19/21: using front wheeled walker at home and community    Time 2    Period Months    Status New    Target Date 06/19/21      PT LONG TERM GOAL #3   Title Patient will improve FOTO score to at least 69 degrees to show improved functional abilities    Baseline 04/19/21: 55 today    Time 2    Period Months    Status New    Target Date 06/19/21              Plan -     Clinical Impression Statement Today's session continues to address improving patient's mobility and Right knee ROM.  She is preparing for her manipulation tomorrow.  Increased hip Abduction to 5#,  Patient will continue to benefit from skilled therapy services to reduce remaining deficits and improve functional  ability to prepare for manipulation tomorrow.      Personal Factors and Comorbidities Past/Current Experience;Time since onset of injury/illness/exacerbation    Examination-Activity Limitations Locomotion Level;Carry;Dressing;Hygiene/Grooming;Lift;Stand;Squat;Stairs    Examination-Participation Restrictions Cleaning;Community Activity;Laundry;Occupation;Yard Work    [employer]    PT Frequency 3x / week    PT Duration 8 weeks    PT Treatment/Interventions ADLs/Self Care Home Management;Biofeedback;Gait training;Stair training;Functional mobility training;Therapeutic activities;Therapeutic exercise;Balance training;Neuromuscular re-education;Patient/family education;Manual techniques;Manual lymph drainage;Scar mobilization;Passive range of motion;Energy conservation;Taping;Vasopneumatic Device;Joint Manipulations    PT Next Visit Plan  Continue to push flexion, resume all other exercises as able.    Consulted and Agree with Plan of Care Patient              11:59 AM, 05/21/21 Kai Railsback Small Marquesha Robideau MPT [place] [facility] [place] [phone]

## 2021-05-24 ENCOUNTER — Ambulatory Visit (HOSPITAL_COMMUNITY): Payer: BC Managed Care – PPO

## 2021-05-24 DIAGNOSIS — Z96651 Presence of right artificial knee joint: Secondary | ICD-10-CM | POA: Diagnosis not present

## 2021-05-24 DIAGNOSIS — R262 Difficulty in walking, not elsewhere classified: Secondary | ICD-10-CM | POA: Diagnosis not present

## 2021-05-24 DIAGNOSIS — M25661 Stiffness of right knee, not elsewhere classified: Secondary | ICD-10-CM

## 2021-05-24 DIAGNOSIS — M1711 Unilateral primary osteoarthritis, right knee: Secondary | ICD-10-CM | POA: Diagnosis not present

## 2021-05-24 DIAGNOSIS — G8929 Other chronic pain: Secondary | ICD-10-CM

## 2021-05-24 DIAGNOSIS — M25561 Pain in right knee: Secondary | ICD-10-CM | POA: Diagnosis not present

## 2021-05-24 NOTE — Therapy (Signed)
OUTPATIENT PHYSICAL THERAPY TREATMENT NOTE   Patient Name: Carrie [name] MRN: [id number] DOB:12-02-1970, 51 y.o., female Today's Date: 05/24/2021  PCP: Ladon Applebaum REFERRING PROVIDER: Magnus Ivan   PT End of Session - 05/24/21 1108     Visit Number 16    Number of Visits 24    Date for PT Re-Evaluation 06/19/21    Authorization Type BCBS no auth needed    PT Start Time 1107    PT Stop Time 1200    PT Time Calculation (min) 53 min    Activity Tolerance Patient tolerated treatment well;No increased pain    Behavior During Therapy [facility] for tasks assessed/performed                         Past Medical History:  Diagnosis Date   Arthritis    Arthrofibrosis of right total knee arthroplasty, subsequent encounter 05/20/2021   Diabetes mellitus without complication (HCC)    Essential hypertension    GERD (gastroesophageal reflux disease)    Mixed hyperlipidemia    PONV (postoperative nausea and vomiting)    Past Surgical History:  Procedure Laterality Date   ABDOMINAL HYSTERECTOMY     HARDWARE REMOVAL Right 04/02/2021   Procedure: Right Knee HARDWARE REMOVAL;  Surgeon: Kathryne Hitch, MD;  Location: [facility];  Service: Orthopedics;  Laterality: Right;   KNEE ARTHROSCOPY  2009   KNEE CLOSED REDUCTION Right 05/20/2021   Procedure: CLOSED MANIPULATION RIGHT KNEE;  Surgeon: Kathryne Hitch, MD;  Location: [facility];  Service: Orthopedics;  Laterality: Right;   MASS EXCISION  02/11/2011   Procedure: EXCISION MASS;  Surgeon: Kerrin Champagne, MD;  Location: [facility];  Service: Orthopedics;  Laterality: Right;  resection of neuroma right infrapatellar medial knee   PATELLA ARTHROPLASTY     rt knee-   TOTAL KNEE ARTHROPLASTY  2011   partial-[place] reg    TOTAL KNEE ARTHROPLASTY Right 04/02/2021   Procedure: Right TOTAL KNEE ARTHROPLASTY;  Surgeon: Kathryne Hitch, MD;  Location: [facility];  Service: Orthopedics;   Laterality: Right;   TUBAL LIGATION     Patient Active Problem List   Diagnosis Date Noted   Arthrofibrosis of right total knee arthroplasty, subsequent encounter 05/20/2021   Pain in right knee 04/19/2021   Stiffness of right knee, not elsewhere classified 04/19/2021   Difficulty in walking, not elsewhere classified 04/19/2021   Status post total knee replacement, right 04/02/2021   Allergic rhinitis due to pollen 06/22/2020   Eosinophilic esophagitis 06/22/2020   Rash and other nonspecific skin eruption 06/22/2020   Unilateral primary osteoarthritis, right knee 10/09/2017   AC joint arthropathy 01/04/2017   Acute pain of right shoulder 11/15/2016   Other derangements of patella, unspecified knee 09/02/2015   Recurrent dislocation of right patella 09/02/2015   Breast hypertrophy in female 05/13/2015   Neuroma of lower extremity 02/11/2011    Class: Chronic    REFERRING DIAG: Right TKA  04/02/21 surgery date, manipulation 05/20/21  THERAPY DIAG:  Unilateral primary osteoarthritis, right knee  Difficulty in walking, not elsewhere classified  Stiffness of right knee, not elsewhere classified  Status post total knee replacement, right  Chronic pain of right knee  PERTINENT HISTORY: Prior right partial replacement 12+ years ago  PRECAUTIONS: none  SUBJECTIVE:  Patient had her manipulation yesterday; very sore  PAIN:  Are you having pain? Yes, 6/10 soreness, especially in medial quad     TODAY'S TREATMENT:  05/24/21 STW and manual Right knee flexion and extension x 28 min in supine; Right knee flexion in sitting with contract relax; all manual interventions performed independently of other interventions.   Bike x 15 min; patient able to make full forward revolution on bike today although she demonstrates noticeable hip hike with knee flexion due to pain  Prone quad stretch with strap 5 x 10"    05/21/21 STW and manual Right knee flexion and extension x 25 min; all  manual interventions performed independently of other interventions.   Bike x 15 min; patient able to make full forward revolution on bike today  Right knee AROM knee flexion in sitting 100 degrees, 105 degrees AAROM    05/19/21 Bike  1/2 revolutions 5 min seat 6 for ROM Calf stretch slant board 3 x 30" Heel raise x 20 Step up 8 inch HHA x 1,  2 x 10 TKE 3 plates 3 x 10 Standing hip abduction 2 x 10; 5 #  Partial squats 2 x 10  Knee driver on 12 inch box 5 x 10"   Seated hamstring stretch for knee extension 5 x 20" Prone hang x 5', 5#   Manual Knee flexion and extension x 10 each  AAROM Right knee extension -2 today in supine    05/17/21 Bike  1/2 revolutions 5 min seat 6 for ROM Calf stretch slant board 3 x 30" Heel raise x 20 Step up 8 inch HHA x 1,  2 x 10 TKE 3 plates 3 x 10 Standing hip abduction 2 x 10; 4 #  Partial squats 2 x 10  Knee driver on 12 inch box 5 x 10"   Seated hamstring stretch for knee extension 5 x 20"  Manual Knee flexion and extension x 10 each   05/14/21 Bike 1/2 revolutions 5 min seat 6  for ROM  Calf stretch slant board 3 x 30" Heel raise x 20 Step up 4 inch HHA x 1,  2 x 10 TKE 2 plates 3 x 10 Standing hip abduction 2 x 10; 3#  Partial squats 2 x 10  Knee driver on 12 inch box 5 x 10"   Seated hamstring stretch for knee extension 5 x 20"   05/12/21 Bike 1/2 revolutions 5 min seat 6  for ROM  Calf stretch slant board 3 x 30" Heel raise x 20 Step up 4 inch HHA x 1,  2 x 10 TKE 2 plates 3 x 10 Standing hip abduction 2 x 10; 3#  Partial squats 2 x 10  Knee driver on 12 inch box 5 x 10"   Seated hamstring stretch for knee extension 5 x 20"  Right knee AROM ext in Supine -10, AAROM ext supine -4  Right knee AROM in sitting KF 85 degrees, 92 AAROM     PATIENT EDUCATION: Education details: Exercise form and function, updated HEP and increased stretching frequency  Person educated: Patient Education method: Software engineer Education comprehension: verbalized understanding  HOME EXERCISE PROGRAM: Exercises - Supine Heel Slide with Strap  - 4-5 x daily - 7 x weekly - 2 sets - 10 reps - 10 second hold - Supine Knee Extension Mobilization with Weight  - 4-5 x daily - 7 x weekly - 1 sets - 1 reps - 3-5 minutes holdAccess Code: [email or web address] Exercises Prone Knee Extension Hang - 3 x daily - 7 x weekly - 1 sets - 1 reps - 1-3 min hold    Exercises Supine  Heel Slides - 3-5 x daily - 7 x weekly - 2 sets - 20 reps Supine Single Leg Ankle Pumps - 3-5 x daily - 7 x weekly - 2 sets - 20 reps Supine Quad Set (Mirrored) - 3 x daily - 7 x weekly - 2 sets - 20 reps Supine Short Arc Quad (Mirrored) - 3 x daily - 7 x weekly - 2 sets - 10 reps Supine Active Straight Leg Raise - 3 x daily - 7 x weekly - 2 sets - 10 reps Supine Hip Abduction - 3 x daily - 7 x weekly - 2 sets - 20 reps Seated Knee Flexion AAROM - 3 x daily - 7 x weekly - 2 sets - 10 reps - 5 seconds hold Seated Long Arc Quad - 3 x daily - 7 x weekly - 2 sets - 10 reps  Exercises Standing Gastroc Stretch on Step - 2 x daily - 7 x weekly - 5 sets - 1 reps - 20 hold Standing Knee Flexion Stretch on Step - 2 x daily - 7 x weekly - 2 sets - 10 reps Standing Hamstring Stretch with Step - 2 x daily - 7 x weekly - 1 sets - 5 reps - 20 hold    PT Short Term Goals -       PT SHORT TERM GOAL #1   Title Patient will be able to improve right knee AROM to 0 degrees extension to improve gait mechanics and standing safety.    Baseline 05/12/21:-10 AROM,  -4 deg extension aAROM    Time 1    Period Months    Status New    Target Date 05/20/21      PT SHORT TERM GOAL #2   Title Patient will be able to improve right knee flexion AROM to 80 degrees to improve function.    Baseline 04/26/21 AAROM R knee 92 degrees in sitting today.    Time 1    Period Months    Status Met 05/10/21   Target Date 05/20/21      PT SHORT TERM GOAL #3   Title Patient will be able  to ambulate without use of front wheel walker throughout home for improved ADLs.    Baseline 04/19/21: Patient currently using front wheel walker consistently    Time 1    Period Months    Status Met 05/12/21   Target Date 05/20/21              PT Long Term Goals       PT LONG TERM GOAL #1   Title Patient will be able to get to 0 degrees extension and at least 110 degrees flexion for right knee AROM for proper mechanics.    Baseline 04/19/21: -14 deg extension and 62 degrees flexion end of session AROM    Time 2    Period Months    Status New    Target Date 06/19/21      PT LONG TERM GOAL #2   Title Patient will be able to ambulate in community over 30 minutes without assistive devices and with good form.    Baseline 04/19/21: using front wheeled walker at home and community    Time 2    Period Months    Status New    Target Date 06/19/21      PT LONG TERM GOAL #3   Title Patient will improve FOTO score to at least 69 degrees to show improved functional abilities  Baseline 04/19/21: 55 today    Time 2    Period Months    Status New    Target Date 06/19/21              Plan -     Clinical Impression Statement Today's session continues to address improving patient's mobility and Right knee ROM s/p manipulation 05/20/21.  She has considerable increased soreness in her Right quad today and noted bruising medial knee joint.  Pain in her Right quads limits knee flexion today; she has good AAROM knee extension. Patient will continue to benefit from skilled therapy services to reduce remaining deficits and improve functional ability.    Personal Factors and Comorbidities Past/Current Experience;Time since onset of injury/illness/exacerbation    Examination-Activity Limitations Locomotion Level;Carry;Dressing;Hygiene/Grooming;Lift;Stand;Squat;Stairs    Examination-Participation Restrictions Cleaning;Community Activity;Laundry;Occupation;Yard Work    [employer]    PT  Frequency 3x / week    PT Duration 8 weeks    PT Treatment/Interventions ADLs/Self Care Home Management;Biofeedback;Gait training;Stair training;Functional mobility training;Therapeutic activities;Therapeutic exercise;Balance training;Neuromuscular re-education;Patient/family education;Manual techniques;Manual lymph drainage;Scar mobilization;Passive range of motion;Energy conservation;Taping;Vasopneumatic Device;Joint Manipulations    PT Next Visit Plan  Continue to focus on Right knee flexion primarily for now, resume all other exercises as able.    Consulted and Agree with Plan of Care Patient             12:01 PM, 05/24/21 Donnalynn Wheeless Small Melisse Caetano MPT [place] [facility]  [phone]

## 2021-05-26 ENCOUNTER — Ambulatory Visit (HOSPITAL_COMMUNITY): Payer: BC Managed Care – PPO

## 2021-05-26 DIAGNOSIS — M25661 Stiffness of right knee, not elsewhere classified: Secondary | ICD-10-CM | POA: Diagnosis not present

## 2021-05-26 DIAGNOSIS — R262 Difficulty in walking, not elsewhere classified: Secondary | ICD-10-CM

## 2021-05-26 DIAGNOSIS — M1711 Unilateral primary osteoarthritis, right knee: Secondary | ICD-10-CM

## 2021-05-26 DIAGNOSIS — G8929 Other chronic pain: Secondary | ICD-10-CM

## 2021-05-26 DIAGNOSIS — Z96651 Presence of right artificial knee joint: Secondary | ICD-10-CM

## 2021-05-26 DIAGNOSIS — M25561 Pain in right knee: Secondary | ICD-10-CM | POA: Diagnosis not present

## 2021-05-26 NOTE — Therapy (Signed)
OUTPATIENT PHYSICAL THERAPY TREATMENT NOTE   Patient Name: Carrie [name] MRN: [id number] DOB:02/02/1971, 51 y.o., female Today's Date: 05/26/2021  PCP: Ladon Applebaum REFERRING PROVIDER: Magnus Ivan   PT End of Session - 05/26/21 1106     Visit Number 17    Number of Visits 24    Date for PT Re-Evaluation 06/19/21    Authorization Type BCBS no auth needed    PT Start Time 1105    PT Stop Time 1200    PT Time Calculation (min) 55 min    Activity Tolerance Patient tolerated treatment well;No increased pain    Behavior During Therapy [facility] for tasks assessed/performed                          Past Medical History:  Diagnosis Date   Arthritis    Arthrofibrosis of right total knee arthroplasty, subsequent encounter 05/20/2021   Diabetes mellitus without complication (HCC)    Essential hypertension    GERD (gastroesophageal reflux disease)    Mixed hyperlipidemia    PONV (postoperative nausea and vomiting)    Past Surgical History:  Procedure Laterality Date   ABDOMINAL HYSTERECTOMY     HARDWARE REMOVAL Right 04/02/2021   Procedure: Right Knee HARDWARE REMOVAL;  Surgeon: Kathryne Hitch, MD;  Location: [facility];  Service: Orthopedics;  Laterality: Right;   KNEE ARTHROSCOPY  2009   KNEE CLOSED REDUCTION Right 05/20/2021   Procedure: CLOSED MANIPULATION RIGHT KNEE;  Surgeon: Kathryne Hitch, MD;  Location: [facility];  Service: Orthopedics;  Laterality: Right;   MASS EXCISION  02/11/2011   Procedure: EXCISION MASS;  Surgeon: Kerrin Champagne, MD;  Location: [facility];  Service: Orthopedics;  Laterality: Right;  resection of neuroma right infrapatellar medial knee   PATELLA ARTHROPLASTY     rt knee-   TOTAL KNEE ARTHROPLASTY  2011   partial-[place] reg    TOTAL KNEE ARTHROPLASTY Right 04/02/2021   Procedure: Right TOTAL KNEE ARTHROPLASTY;  Surgeon: Kathryne Hitch, MD;  Location: [facility];  Service:  Orthopedics;  Laterality: Right;   TUBAL LIGATION     Patient Active Problem List   Diagnosis Date Noted   Arthrofibrosis of right total knee arthroplasty, subsequent encounter 05/20/2021   Pain in right knee 04/19/2021   Stiffness of right knee, not elsewhere classified 04/19/2021   Difficulty in walking, not elsewhere classified 04/19/2021   Status post total knee replacement, right 04/02/2021   Allergic rhinitis due to pollen 06/22/2020   Eosinophilic esophagitis 06/22/2020   Rash and other nonspecific skin eruption 06/22/2020   Unilateral primary osteoarthritis, right knee 10/09/2017   AC joint arthropathy 01/04/2017   Acute pain of right shoulder 11/15/2016   Other derangements of patella, unspecified knee 09/02/2015   Recurrent dislocation of right patella 09/02/2015   Breast hypertrophy in female 05/13/2015   Neuroma of lower extremity 02/11/2011    Class: Chronic    REFERRING DIAG: Right TKA  04/02/21 surgery date, manipulation 05/20/21  THERAPY DIAG:  Unilateral primary osteoarthritis, right knee  Difficulty in walking, not elsewhere classified  Status post total knee replacement, right  Chronic pain of right knee  Stiffness of right knee, not elsewhere classified  PERTINENT HISTORY: Prior right partial replacement 12+ years ago  PRECAUTIONS: none  SUBJECTIVE:  Patient had her manipulation 4/6; still very sore  PAIN:  Are you having pain? Yes,3/10 soreness, medial quad and Right lateral knee joint  TODAY'S TREATMENT:  05/26/21 Bike x 20 min; patient unable to make full revolution initially but after warming up is able to make full revolutions with some hip hike substitution, seat on 7 today  STW and manual Right knee flexion and extension x 25 min in supine; Right knee flexion in sitting with contract relax; all manual interventions performed independently of other interventions.   Right knee extension in supine AAROM 0 degrees today  Right knee  flexion in sitting 90 AROM, 100 degrees AAROM; patient with bruising Right quad noted and pulling and complaint of pain with man stretching           05/24/21 STW and manual Right knee flexion and extension x 28 min in supine; Right knee flexion in sitting with contract relax; all manual interventions performed independently of other interventions.   Bike x 15 min; patient able to make full forward revolution on bike today although she demonstrates noticeable hip hike with knee flexion due to pain  Prone quad stretch with strap 5 x 10"    05/21/21 STW and manual Right knee flexion and extension x 25 min; all manual interventions performed independently of other interventions.   Bike x 15 min; patient able to make full forward revolution on bike today  Right knee AROM knee flexion in sitting 100 degrees, 105 degrees AAROM    05/19/21 Bike  1/2 revolutions 5 min seat 6 for ROM Calf stretch slant board 3 x 30" Heel raise x 20 Step up 8 inch HHA x 1,  2 x 10 TKE 3 plates 3 x 10 Standing hip abduction 2 x 10; 5 #  Partial squats 2 x 10  Knee driver on 12 inch box 5 x 10"   Seated hamstring stretch for knee extension 5 x 20" Prone hang x 5', 5#   Manual Knee flexion and extension x 10 each  AAROM Right knee extension -2 today in supine       PATIENT EDUCATION: Education details: Exercise form and function, updated HEP and increased stretching frequency  Person educated: Patient Education method: Medical illustrator Education comprehension: verbalized understanding  HOME EXERCISE PROGRAM: Exercises - Supine Heel Slide with Strap  - 4-5 x daily - 7 x weekly - 2 sets - 10 reps - 10 second hold - Supine Knee Extension Mobilization with Weight  - 4-5 x daily - 7 x weekly - 1 sets - 1 reps - 3-5 minutes holdAccess Code: [email or web address] Exercises Prone Knee Extension Hang - 3 x daily - 7 x weekly - 1 sets - 1 reps - 1-3 min hold    Exercises Supine Heel Slides - 3-5  x daily - 7 x weekly - 2 sets - 20 reps Supine Single Leg Ankle Pumps - 3-5 x daily - 7 x weekly - 2 sets - 20 reps Supine Quad Set (Mirrored) - 3 x daily - 7 x weekly - 2 sets - 20 reps Supine Short Arc Quad (Mirrored) - 3 x daily - 7 x weekly - 2 sets - 10 reps Supine Active Straight Leg Raise - 3 x daily - 7 x weekly - 2 sets - 10 reps Supine Hip Abduction - 3 x daily - 7 x weekly - 2 sets - 20 reps Seated Knee Flexion AAROM - 3 x daily - 7 x weekly - 2 sets - 10 reps - 5 seconds hold Seated Long Arc Quad - 3 x daily - 7 x weekly - 2 sets - 10 reps  Exercises Standing Gastroc Stretch on Step - 2 x daily - 7 x weekly - 5 sets - 1 reps - 20 hold Standing Knee Flexion Stretch on Step - 2 x daily - 7 x weekly - 2 sets - 10 reps Standing Hamstring Stretch with Step - 2 x daily - 7 x weekly - 1 sets - 5 reps - 20 hold    PT Short Term Goals -       PT SHORT TERM GOAL #1   Title Patient will be able to improve right knee AROM to 0 degrees extension to improve gait mechanics and standing safety.    Baseline 05/12/21:0 deg extension AAROM    Time 1    Period Months    Status ongoing   Target Date 05/20/21      PT SHORT TERM GOAL #2   Title Patient will be able to improve right knee flexion AROM to 80 degrees to improve function.    Baseline 04/26/21 AAROM R knee 92 degrees in sitting today.    Time 1    Period Months    Status Met 05/10/21   Target Date 05/20/21      PT SHORT TERM GOAL #3   Title Patient will be able to ambulate without use of front wheel walker throughout home for improved ADLs.    Baseline 04/19/21: Patient currently using front wheel walker consistently    Time 1    Period Months    Status Met 05/12/21   Target Date 05/20/21              PT Long Term Goals       PT LONG TERM GOAL #1   Title Patient will be able to get to 0 degrees extension and at least 110 degrees flexion for right knee AROM for proper mechanics.    Baseline 05/26/21: 0 deg AAROM extension  and 100 degrees flexion end of session AAROM    Time 2    Period Months    Status New    Target Date 06/19/21      PT LONG TERM GOAL #2   Title Patient will be able to ambulate in community over 30 minutes without assistive devices and with good form.    Baseline 04/19/21: using front wheeled walker at home and community    Time 2    Period Months    Status New    Target Date 06/19/21      PT LONG TERM GOAL #3   Title Patient will improve FOTO score to at least 69 degrees to show improved functional abilities    Baseline 04/19/21: 55 today    Time 2    Period Months    Status New    Target Date 06/19/21              Plan -     Clinical Impression Statement Today's session continues to address improving patient's mobility and Right knee ROM s/p manipulation 05/20/21.  She has considerable i soreness in her Right quad today and noted bruising medial knee joint and quad.  Pain in her Right quads limits knee flexion today; she has good AAROM knee extension to 0, AAROM Right knee flexion in sitting 100 degrees today.  Patient will continue to benefit from skilled therapy services to reduce remaining deficits and improve functional ability. Requesting additional 3 x week x 2 weeks to work on remaining deficits.   Personal Factors and Comorbidities Past/Current Experience;Time since onset of  injury/illness/exacerbation    Examination-Activity Limitations Locomotion Level;Carry;Dressing;Hygiene/Grooming;Lift;Stand;Squat;Stairs    Examination-Participation Restrictions Cleaning;Community Activity;Laundry;Occupation;Yard Work    [employer]    PT Frequency 3x / week    PT Duration 8 weeks    PT Treatment/Interventions ADLs/Self Care Home Management;Biofeedback;Gait training;Stair training;Functional mobility training;Therapeutic activities;Therapeutic exercise;Balance training;Neuromuscular re-education;Patient/family education;Manual techniques;Manual lymph drainage;Scar  mobilization;Passive range of motion;Energy conservation;Taping;Vasopneumatic Device;Joint Manipulations    PT Next Visit Plan  Continue to focus on Right knee flexion primarily for now, resume all other exercises as able.    Consulted and Agree with Plan of Care Patient             11:51 AM, 05/26/21 Eusebio Blazejewski Small Elin Fenley MPT [facility] [facility] [place] [phone]

## 2021-05-28 ENCOUNTER — Ambulatory Visit (HOSPITAL_COMMUNITY): Payer: BC Managed Care – PPO

## 2021-05-28 DIAGNOSIS — R262 Difficulty in walking, not elsewhere classified: Secondary | ICD-10-CM | POA: Diagnosis not present

## 2021-05-28 DIAGNOSIS — M25661 Stiffness of right knee, not elsewhere classified: Secondary | ICD-10-CM

## 2021-05-28 DIAGNOSIS — M25561 Pain in right knee: Secondary | ICD-10-CM | POA: Diagnosis not present

## 2021-05-28 DIAGNOSIS — M1711 Unilateral primary osteoarthritis, right knee: Secondary | ICD-10-CM

## 2021-05-28 DIAGNOSIS — G8929 Other chronic pain: Secondary | ICD-10-CM

## 2021-05-28 DIAGNOSIS — Z96651 Presence of right artificial knee joint: Secondary | ICD-10-CM | POA: Diagnosis not present

## 2021-05-28 NOTE — Therapy (Addendum)
OUTPATIENT PHYSICAL [name] TREATMENT NOTE   Patient Name: Carrie [name] MRN: [id number] DOB:03/04/1970, 51 y.o., female Today's Date: 05/28/2021  PCP: Ladon Applebaum REFERRING PROVIDER: Magnus Ivan   PT End of Session - 05/28/21 1119     Visit Number 18    Number of Visits 24    Date for PT Re-Evaluation 06/19/21    Authorization Type BCBS no auth needed    PT Start Time 1115    PT Stop Time 1200    PT Time Calculation (min) 45 min    Activity Tolerance Patient tolerated treatment well;No increased pain    Behavior During [name] for tasks assessed/performed                           Past Medical History:  Diagnosis Date   Arthritis    Arthrofibrosis of right total knee arthroplasty, subsequent encounter 05/20/2021   Diabetes mellitus without complication (HCC)    Essential hypertension    GERD (gastroesophageal reflux disease)    Mixed hyperlipidemia    PONV (postoperative nausea and vomiting)    Past Surgical History:  Procedure Laterality Date   ABDOMINAL HYSTERECTOMY     HARDWARE REMOVAL Right 04/02/2021   Procedure: Right Knee HARDWARE REMOVAL;  Surgeon: Kathryne Hitch, MD;  Location: [facility];  Service: Orthopedics;  Laterality: Right;   KNEE ARTHROSCOPY  2009   KNEE CLOSED REDUCTION Right 05/20/2021   Procedure: CLOSED MANIPULATION RIGHT KNEE;  Surgeon: Kathryne Hitch, MD;  Location: [facility];  Service: Orthopedics;  Laterality: Right;   MASS EXCISION  02/11/2011   Procedure: EXCISION MASS;  Surgeon: Kerrin Champagne, MD;  [facility];  Service: Orthopedics;  Laterality: Right;  resection of neuroma right infrapatellar medial knee   PATELLA ARTHROPLASTY     rt knee-   TOTAL KNEE ARTHROPLASTY  2011   partial-[place] reg    TOTAL KNEE ARTHROPLASTY Right 04/02/2021   Procedure: Right TOTAL KNEE ARTHROPLASTY;  Surgeon: Kathryne Hitch, MD;  Location: [facility];  Service:  Orthopedics;  Laterality: Right;   TUBAL LIGATION     Patient Active Problem List   Diagnosis Date Noted   Arthrofibrosis of right total knee arthroplasty, subsequent encounter 05/20/2021   Pain in right knee 04/19/2021   Stiffness of right knee, not elsewhere classified 04/19/2021   Difficulty in walking, not elsewhere classified 04/19/2021   Status post total knee replacement, right 04/02/2021   Allergic rhinitis due to pollen 06/22/2020   Eosinophilic esophagitis 06/22/2020   Rash and other nonspecific skin eruption 06/22/2020   Unilateral primary osteoarthritis, right knee 10/09/2017   AC joint arthropathy 01/04/2017   Acute pain of right shoulder 11/15/2016   Other derangements of patella, unspecified knee 09/02/2015   Recurrent dislocation of right patella 09/02/2015   Breast hypertrophy in female 05/13/2015   Neuroma of lower extremity 02/11/2011    Class: Chronic    REFERRING DIAG: Right TKA  04/02/21 surgery date, manipulation 05/20/21  [name] DIAG:  Unilateral primary osteoarthritis, right knee  Chronic pain of right knee  Difficulty in walking, not elsewhere classified  Stiffness of right knee, not elsewhere classified  Status post total knee replacement, right  PERTINENT HISTORY: Prior right partial replacement 12+ years ago  PRECAUTIONS: none  SUBJECTIVE:  Patient had her manipulation 4/6; still very sore; " I don't know what I did but both knees are sore today"  PAIN:  Are  you having pain? Yes,3/10 soreness, medial quad and Right lateral knee joint     TODAY'S TREATMENT:  05/28/21 Bike x 15 min seat 7 Box knee flexion x 1'  STW and manual Right knee flexion and extension x 15 min in supine and sitting ; all manual interventions performed independently of other interventions   05/26/21 Bike x 20 min; patient unable to make full revolution initially but after warming up is able to make full revolutions with some hip hike substitution, seat on 7  today  STW and manual Right knee flexion and extension x 25 min in supine; Right knee flexion in sitting with contract relax; all manual interventions performed independently of other interventions.   Right knee extension in supine AAROM 0 degrees today  Right knee flexion in sitting 90 AROM, 100 degrees AAROM; patient with bruising Right quad noted and pulling and complaint of pain with man stretching           05/24/21 STW and manual Right knee flexion and extension x 28 min in supine; Right knee flexion in sitting with contract relax; all manual interventions performed independently of other interventions.   Bike x 15 min; patient able to make full forward revolution on bike today although she demonstrates noticeable hip hike with knee flexion due to pain  Prone quad stretch with strap 5 x 10"    05/21/21 STW and manual Right knee flexion and extension x 25 min; all manual interventions performed independently of other interventions.   Bike x 15 min; patient able to make full forward revolution on bike today  Right knee AROM knee flexion in sitting 100 degrees, 105 degrees AAROM    05/19/21 Bike  1/2 revolutions 5 min seat 6 for ROM Calf stretch slant board 3 x 30" Heel raise x 20 Step up 8 inch HHA x 1,  2 x 10 TKE 3 plates 3 x 10 Standing hip abduction 2 x 10; 5 #  Partial squats 2 x 10  Knee driver on 12 inch box 5 x 10"   Seated hamstring stretch for knee extension 5 x 20" Prone hang x 5', 5#   Manual Knee flexion and extension x 10 each  AAROM Right knee extension -2 today in supine       PATIENT EDUCATION: Education details: Exercise form and function, updated HEP and increased stretching frequency  Person educated: Patient Education method: Medical illustrator Education comprehension: verbalized understanding  HOME EXERCISE PROGRAM: Exercises - Supine Heel Slide with Strap  - 4-5 x daily - 7 x weekly - 2 sets - 10 reps - 10 second hold -  Supine Knee Extension Mobilization with Weight  - 4-5 x daily - 7 x weekly - 1 sets - 1 reps - 3-5 minutes holdAccess Code: [email or web address] Exercises Prone Knee Extension Hang - 3 x daily - 7 x weekly - 1 sets - 1 reps - 1-3 min hold    Exercises Supine Heel Slides - 3-5 x daily - 7 x weekly - 2 sets - 20 reps Supine Single Leg Ankle Pumps - 3-5 x daily - 7 x weekly - 2 sets - 20 reps Supine Quad Set (Mirrored) - 3 x daily - 7 x weekly - 2 sets - 20 reps Supine Short Arc Quad (Mirrored) - 3 x daily - 7 x weekly - 2 sets - 10 reps Supine Active Straight Leg Raise - 3 x daily - 7 x weekly - 2 sets - 10 reps Supine Hip  Abduction - 3 x daily - 7 x weekly - 2 sets - 20 reps Seated Knee Flexion AAROM - 3 x daily - 7 x weekly - 2 sets - 10 reps - 5 seconds hold Seated Long Arc Quad - 3 x daily - 7 x weekly - 2 sets - 10 reps  Exercises Standing Gastroc Stretch on Step - 2 x daily - 7 x weekly - 5 sets - 1 reps - 20 hold Standing Knee Flexion Stretch on Step - 2 x daily - 7 x weekly - 2 sets - 10 reps Standing Hamstring Stretch with Step - 2 x daily - 7 x weekly - 1 sets - 5 reps - 20 hold    PT Short Term Goals -       PT SHORT TERM GOAL #1   Title Patient will be able to improve right knee AROM to 0 degrees extension to improve gait mechanics and standing safety.    Baseline 05/12/21:0 deg extension AAROM    Time 1    Period Months    Status ongoing   Target Date 05/20/21      PT SHORT TERM GOAL #2   Title Patient will be able to improve right knee flexion AROM to 80 degrees to improve function.    Baseline 04/26/21 AAROM R knee 92 degrees in sitting today.    Time 1    Period Months    Status Met 05/10/21   Target Date 05/20/21      PT SHORT TERM GOAL #3   Title Patient will be able to ambulate without use of front wheel walker throughout home for improved ADLs.    Baseline 04/19/21: Patient currently using front wheel walker consistently    Time 1    Period Months    Status Met 05/12/21    Target Date 05/20/21              PT Long Term Goals       PT LONG TERM GOAL #1   Title Patient will be able to get to 0 degrees extension and at least 110 degrees flexion for right knee AROM for proper mechanics.    Baseline 05/26/21: 0 deg AAROM extension and 100 degrees flexion end of session AAROM    Time 2    Period Months    Status New    Target Date 06/19/21      PT LONG TERM GOAL #2   Title Patient will be able to ambulate in community over 30 minutes without assistive devices and with good form.    Baseline 04/19/21: using front wheeled walker at home and community    Time 2    Period Months    Status New    Target Date 06/19/21      PT LONG TERM GOAL #3   Title Patient will improve FOTO score to at least 69 degrees to show improved functional abilities    Baseline 04/19/21: 55 today    Time 2    Period Months    Status New    Target Date 06/19/21              Plan -     Clinical Impression Statement Today's session continues to address improving patient's mobility and Right knee ROM s/p manipulation 05/20/21.  Patient with noted increased bruising medial quad and knee area today. Patient quad soreness limits her knee flexion but she is working hard with [name].  Continues with decreased knee flexion  with toe off with ambulation. Patient will benefit from continued skilled [name] interventions to address deficits and improve functional mobility.     Personal Factors and Comorbidities Past/Current Experience;Time since onset of injury/illness/exacerbation    Examination-Activity Limitations Locomotion Level;Carry;Dressing;Hygiene/Grooming;Lift;Stand;Squat;Stairs    Examination-Participation Restrictions Cleaning;Community Activity;Laundry;Occupation;Yard Work    [employer]    PT Frequency 3x / week    PT Duration 8 weeks    PT Treatment/Interventions ADLs/Self Care Home Management;Biofeedback;Gait training;Stair training;Functional mobility  training;Therapeutic activities;Therapeutic exercise;Balance training;Neuromuscular re-education;Patient/family education;Manual techniques;Manual lymph drainage;Scar mobilization;Passive range of motion;Energy conservation;Taping;Vasopneumatic Device;Joint Manipulations    PT Next Visit Plan  Continue to focus on Right knee flexion primarily for now, resume all other exercises as able.    Consulted and Agree with Plan of Care Patient             12:08 PM, 05/28/21 Carrie [name] Carrie [name] [place] [phone]

## 2021-06-01 ENCOUNTER — Ambulatory Visit (HOSPITAL_COMMUNITY): Payer: BC Managed Care – PPO

## 2021-06-01 DIAGNOSIS — G8929 Other chronic pain: Secondary | ICD-10-CM | POA: Diagnosis not present

## 2021-06-01 DIAGNOSIS — Z96651 Presence of right artificial knee joint: Secondary | ICD-10-CM | POA: Diagnosis not present

## 2021-06-01 DIAGNOSIS — M1711 Unilateral primary osteoarthritis, right knee: Secondary | ICD-10-CM | POA: Diagnosis not present

## 2021-06-01 DIAGNOSIS — M25661 Stiffness of right knee, not elsewhere classified: Secondary | ICD-10-CM

## 2021-06-01 DIAGNOSIS — M25561 Pain in right knee: Secondary | ICD-10-CM | POA: Diagnosis not present

## 2021-06-01 DIAGNOSIS — R262 Difficulty in walking, not elsewhere classified: Secondary | ICD-10-CM

## 2021-06-01 NOTE — Therapy (Signed)
OUTPATIENT PHYSICAL THERAPY TREATMENT NOTE   Patient Name: Carrie [name] MRN: [id number] DOB:[birth year], 51 y.o., female Today's Date: 06/01/2021  PCP: Ladon Applebaum REFERRING PROVIDER: Magnus Ivan   PT End of Session - 06/01/21 1118     Visit Number 19    Number of Visits 24    Date for PT Re-Evaluation 06/19/21    Authorization Type BCBS no auth needed    PT Start Time 1116    PT Stop Time 1200    PT Time Calculation (min) 44 min    Activity Tolerance Patient tolerated treatment well;No increased pain    Behavior During Therapy [facility] for tasks assessed/performed                            Past Medical History:  Diagnosis Date   Arthritis    Arthrofibrosis of right total knee arthroplasty, subsequent encounter 05/20/2021   Diabetes mellitus without complication (HCC)    Essential hypertension    GERD (gastroesophageal reflux disease)    Mixed hyperlipidemia    PONV (postoperative nausea and vomiting)    Past Surgical History:  Procedure Laterality Date   ABDOMINAL HYSTERECTOMY     HARDWARE REMOVAL Right 04/02/2021   Procedure: Right Knee HARDWARE REMOVAL;  Surgeon: Kathryne Hitch, MD;  Location: [facility];  Service: Orthopedics;  Laterality: Right;   KNEE ARTHROSCOPY  2009   KNEE CLOSED REDUCTION Right 05/20/2021   Procedure: CLOSED MANIPULATION RIGHT KNEE;  Surgeon: Kathryne Hitch, MD;  Location: [facility];  Service: Orthopedics;  Laterality: Right;   MASS EXCISION  02/11/2011   Procedure: EXCISION MASS;  Surgeon: Kerrin Champagne, MD;  Location: [facility];  Service: Orthopedics;  Laterality: Right;  resection of neuroma right infrapatellar medial knee   PATELLA ARTHROPLASTY     rt knee-   TOTAL KNEE ARTHROPLASTY  2011   partial-[place] [facility]    TOTAL KNEE ARTHROPLASTY Right 04/02/2021   Procedure: Right TOTAL KNEE ARTHROPLASTY;  Surgeon: Kathryne Hitch, MD;  Location: [facility];  Service:  Orthopedics;  Laterality: Right;   TUBAL LIGATION     Patient Active Problem List   Diagnosis Date Noted   Arthrofibrosis of right total knee arthroplasty, subsequent encounter 05/20/2021   Pain in right knee 04/19/2021   Stiffness of right knee, not elsewhere classified 04/19/2021   Difficulty in walking, not elsewhere classified 04/19/2021   Status post total knee replacement, right 04/02/2021   Allergic rhinitis due to pollen 06/22/2020   Eosinophilic esophagitis 06/22/2020   Rash and other nonspecific skin eruption 06/22/2020   Unilateral primary osteoarthritis, right knee 10/09/2017   AC joint arthropathy 01/04/2017   Acute pain of right shoulder 11/15/2016   Other derangements of patella, unspecified knee 09/02/2015   Recurrent dislocation of right patella 09/02/2015   Breast hypertrophy in female 05/13/2015   Neuroma of lower extremity 02/11/2011    Class: Chronic    REFERRING DIAG: Right TKA  04/02/21 surgery date, manipulation 05/20/21  THERAPY DIAG:  Unilateral primary osteoarthritis, right knee  Stiffness of right knee, not elsewhere classified  Chronic pain of right knee  Status post total knee replacement, right  Difficulty in walking, not elsewhere classified  PERTINENT HISTORY: Prior right partial replacement 12+ years ago  PRECAUTIONS: none  SUBJECTIVE:  Patient had her manipulation 4/6; still very sore; "my knee and my quad are sore today"  PAIN:  Are you having pain? Yes,4/10  soreness, medial quad and Right lateral knee joint     TODAY'S TREATMENT:  06/01/21 Bike x 15 min seat 5  Box flexion; knee drives x 2 '   STW and manual Right knee flexion and extension x 10 min in supine and sitting ; all manual interventions performed independently of other interventions  Bike another 5 min on seat 4 able to make full revolutions  Right knee AAROM knee extension in supine 0  Right knee AROM knee flexion 92, AAROM 105 (improved from 50 AROM and 60  AAROM at eval)      05/28/21 Bike x 15 min seat 7 Box knee flexion x 1'  STW and manual Right knee flexion and extension x 15 min in supine and sitting ; all manual interventions performed independently of other interventions   05/26/21 Bike x 20 min; patient unable to make full revolution initially but after warming up is able to make full revolutions with some hip hike substitution, seat on 7 today  STW and manual Right knee flexion and extension x 25 min in supine; Right knee flexion in sitting with contract relax; all manual interventions performed independently of other interventions.   Right knee extension in supine AAROM 0 degrees today  Right knee flexion in sitting 90 AROM, 100 degrees AAROM; patient with bruising Right quad noted and pulling and complaint of pain with man stretching           05/24/21 STW and manual Right knee flexion and extension x 28 min in supine; Right knee flexion in sitting with contract relax; all manual interventions performed independently of other interventions.   Bike x 15 min; patient able to make full forward revolution on bike today although she demonstrates noticeable hip hike with knee flexion due to pain  Prone quad stretch with strap 5 x 10"    05/21/21 STW and manual Right knee flexion and extension x 25 min; all manual interventions performed independently of other interventions.   Bike x 15 min; patient able to make full forward revolution on bike today  Right knee AROM knee flexion in sitting 100 degrees, 105 degrees AAROM       PATIENT EDUCATION: Education details: Exercise form and function, updated HEP and increased stretching frequency  Person educated: Patient Education method: Medical illustrator Education comprehension: verbalized understanding  HOME EXERCISE PROGRAM: Exercises - Supine Heel Slide with Strap  - 4-5 x daily - 7 x weekly - 2 sets - 10 reps - 10 second hold - Supine Knee Extension  Mobilization with Weight  - 4-5 x daily - 7 x weekly - 1 sets - 1 reps - 3-5 minutes holdAccess Code: [email or web address] Exercises Prone Knee Extension Hang - 3 x daily - 7 x weekly - 1 sets - 1 reps - 1-3 min hold    Exercises Supine Heel Slides - 3-5 x daily - 7 x weekly - 2 sets - 20 reps Supine Single Leg Ankle Pumps - 3-5 x daily - 7 x weekly - 2 sets - 20 reps Supine Quad Set (Mirrored) - 3 x daily - 7 x weekly - 2 sets - 20 reps Supine Short Arc Quad (Mirrored) - 3 x daily - 7 x weekly - 2 sets - 10 reps Supine Active Straight Leg Raise - 3 x daily - 7 x weekly - 2 sets - 10 reps Supine Hip Abduction - 3 x daily - 7 x weekly - 2 sets - 20 reps Seated Knee Flexion AAROM -  3 x daily - 7 x weekly - 2 sets - 10 reps - 5 seconds hold Seated Long Arc Quad - 3 x daily - 7 x weekly - 2 sets - 10 reps  Exercises Standing Gastroc Stretch on Step - 2 x daily - 7 x weekly - 5 sets - 1 reps - 20 hold Standing Knee Flexion Stretch on Step - 2 x daily - 7 x weekly - 2 sets - 10 reps Standing Hamstring Stretch with Step - 2 x daily - 7 x weekly - 1 sets - 5 reps - 20 hold    PT Short Term Goals -       PT SHORT TERM GOAL #1   Title Patient will be able to improve right knee AROM to 0 degrees extension to improve gait mechanics and standing safety.    Baseline 05/12/21:0 deg extension AAROM    Time 1    Period Months    Status ongoing   Target Date 05/20/21      PT SHORT TERM GOAL #2   Title Patient will be able to improve right knee flexion AROM to 80 degrees to improve function.    Baseline 04/26/21 AAROM R knee 92 degrees in sitting today.    Time 1    Period Months    Status Met 05/10/21   Target Date 05/20/21      PT SHORT TERM GOAL #3   Title Patient will be able to ambulate without use of front wheel walker throughout home for improved ADLs.    Baseline 04/19/21: Patient currently using front wheel walker consistently    Time 1    Period Months    Status Met 05/12/21   Target Date  05/20/21              PT Long Term Goals       PT LONG TERM GOAL #1   Title Patient will be able to get to 0 degrees extension and at least 110 degrees flexion for right knee AROM for proper mechanics.    Baseline 05/26/21: 0 deg AAROM extension and 100 degrees flexion end of session AAROM    Time 2    Period Months    Status New    Target Date 06/19/21      PT LONG TERM GOAL #2   Title Patient will be able to ambulate in community over 30 minutes without assistive devices and with good form.    Baseline 04/19/21: using front wheeled walker at home and community    Time 2    Period Months    Status New    Target Date 06/19/21      PT LONG TERM GOAL #3   Title Patient will improve FOTO score to at least 69 degrees to show improved functional abilities    Baseline 04/19/21: 55 today    Time 2    Period Months    Status New    Target Date 06/19/21              Plan -     Clinical Impression Statement Today's session continues to address improving patient's mobility and Right knee ROM s/p manipulation 05/20/21.  She has compliant of increased soreness but demonstrates improved mobility with the Right knee today; she continues with decreased heel strike and toe off with ambulation although overall more normalized gait pattern noted.  She continues to work hard in therapy.  Patient will benefit from continued skilled therapy interventions to  address deficits and improve functional mobility.     Personal Factors and Comorbidities Past/Current Experience;Time since onset of injury/illness/exacerbation    Examination-Activity Limitations Locomotion Level;Carry;Dressing;Hygiene/Grooming;Lift;Stand;Squat;Stairs    Examination-Participation Restrictions Cleaning;Community Activity;Laundry;Occupation;Yard Work    [employer]    PT Frequency 3x / week    PT Duration 8 weeks    PT Treatment/Interventions ADLs/Self Care Home Management;Biofeedback;Gait training;Stair  training;Functional mobility training;Therapeutic activities;Therapeutic exercise;Balance training;Neuromuscular re-education;Patient/family education;Manual techniques;Manual lymph drainage;Scar mobilization;Passive range of motion;Energy conservation;Taping;Vasopneumatic Device;Joint Manipulations    PT Next Visit Plan  Continue to focus on Right knee flexion primarily for now, resume all other exercises as able. Sees MD on Thursday afternoon 06/03/21   Consulted and Agree with Plan of Care Patient             12:03 PM, 06/01/21 Cristen Murcia Small Jerusalem Wert MPT [facility] [place] [phone]

## 2021-06-03 ENCOUNTER — Ambulatory Visit (INDEPENDENT_AMBULATORY_CARE_PROVIDER_SITE_OTHER): Payer: BC Managed Care – PPO | Admitting: Orthopaedic Surgery

## 2021-06-03 ENCOUNTER — Ambulatory Visit (HOSPITAL_COMMUNITY): Payer: BC Managed Care – PPO

## 2021-06-03 ENCOUNTER — Encounter: Payer: Self-pay | Admitting: Orthopaedic Surgery

## 2021-06-03 DIAGNOSIS — M25661 Stiffness of right knee, not elsewhere classified: Secondary | ICD-10-CM

## 2021-06-03 DIAGNOSIS — Z96651 Presence of right artificial knee joint: Secondary | ICD-10-CM

## 2021-06-03 DIAGNOSIS — R262 Difficulty in walking, not elsewhere classified: Secondary | ICD-10-CM | POA: Diagnosis not present

## 2021-06-03 DIAGNOSIS — G8929 Other chronic pain: Secondary | ICD-10-CM

## 2021-06-03 DIAGNOSIS — M25561 Pain in right knee: Secondary | ICD-10-CM | POA: Diagnosis not present

## 2021-06-03 DIAGNOSIS — M1711 Unilateral primary osteoarthritis, right knee: Secondary | ICD-10-CM | POA: Diagnosis not present

## 2021-06-03 NOTE — Therapy (Signed)
OUTPATIENT PHYSICAL THERAPY TREATMENT NOTE   Carrie [name]: Carrie [name] MRN: [id number] DOB:1970-10-29, 51 y.o., female Today's Date: 06/03/2021  PCP: Ladon Applebaum REFERRING PROVIDER: Magnus Ivan   PT End of Session - 06/03/21 1309     Visit Number 20    Number of Visits 24    Date for PT Re-Evaluation 06/19/21    Authorization Type BCBS no auth needed    PT Start Time 1300    PT Stop Time 1345    PT Time Calculation (min) 45 min    Activity Tolerance Carrie tolerated treatment well;No increased pain    Behavior During Therapy [facility] for tasks assessed/performed                             Past Medical History:  Diagnosis Date   Arthritis    Arthrofibrosis of right total knee arthroplasty, subsequent encounter 05/20/2021   Diabetes mellitus without complication (HCC)    Essential hypertension    GERD (gastroesophageal reflux disease)    Mixed hyperlipidemia    PONV (postoperative nausea and vomiting)    Past Surgical History:  Procedure Laterality Date   ABDOMINAL HYSTERECTOMY     HARDWARE REMOVAL Right 04/02/2021   Procedure: Right Knee HARDWARE REMOVAL;  Surgeon: Kathryne Hitch, MD;  Location: [facility];  Service: Orthopedics;  Laterality: Right;   KNEE ARTHROSCOPY  2009   KNEE CLOSED REDUCTION Right 05/20/2021   Procedure: CLOSED MANIPULATION RIGHT KNEE;  Surgeon: Kathryne Hitch, MD;  Location: [facility];  Service: Orthopedics;  Laterality: Right;   MASS EXCISION  02/11/2011   Procedure: EXCISION MASS;  Surgeon: Kerrin Champagne, MD;  Location: [facility];  Service: Orthopedics;  Laterality: Right;  resection of neuroma right infrapatellar medial knee   PATELLA ARTHROPLASTY     rt knee-   TOTAL KNEE ARTHROPLASTY  2011   partial-[place] reg    TOTAL KNEE ARTHROPLASTY Right 04/02/2021   Procedure: Right TOTAL KNEE ARTHROPLASTY;  Surgeon: Kathryne Hitch, MD;  Location: [facility];  Service:  Orthopedics;  Laterality: Right;   TUBAL LIGATION     Carrie Active Problem List   Diagnosis Date Noted   Arthrofibrosis of right total knee arthroplasty, subsequent encounter 05/20/2021   Pain in right knee 04/19/2021   Stiffness of right knee, not elsewhere classified 04/19/2021   Difficulty in walking, not elsewhere classified 04/19/2021   Status post total knee replacement, right 04/02/2021   Allergic rhinitis due to pollen 06/22/2020   Eosinophilic esophagitis 06/22/2020   Rash and other nonspecific skin eruption 06/22/2020   Unilateral primary osteoarthritis, right knee 10/09/2017   AC joint arthropathy 01/04/2017   Acute pain of right shoulder 11/15/2016   Other derangements of patella, unspecified knee 09/02/2015   Recurrent dislocation of right patella 09/02/2015   Breast hypertrophy in female 05/13/2015   Neuroma of lower extremity 02/11/2011    Class: Chronic    REFERRING DIAG: Right TKA  04/02/21 surgery date, manipulation 05/20/21  THERAPY DIAG:  Unilateral primary osteoarthritis, right knee  Status post total knee replacement, right  Stiffness of right knee, not elsewhere classified  Difficulty in walking, not elsewhere classified  Chronic pain of right knee  PERTINENT HISTORY: Prior right partial replacement 12+ years ago  PRECAUTIONS: none  SUBJECTIVE:  Carrie had her manipulation 4/6; not as sore today; working hard at home; ordered a bike to use at home   PAIN:  Are you having pain? Yes,2/10 soreness, medial quad and Right lateral knee joint     TODAY'S TREATMENT:  06/03/21  Right knee AROM flexion in sitting 100 degrees, 110 degrees AAROM            Knee extension in lying 0 supine AAROM  Bike x 15 seat level 5 (started at 6 for first 5 min) Box knee drives for knee flexion x 2"  STW and manual Right knee flexion and extension x 15 min in supine and sitting ; all manual interventions performed independently of other  interventions     06/01/21 Bike x 15 min seat 5  Box flexion; knee drives x 2 '   STW and manual Right knee flexion and extension x 10 min in supine and sitting ; all manual interventions performed independently of other interventions  Bike another 5 min on seat 4 able to make full revolutions  Right knee AAROM knee extension in supine 0  Right knee AROM knee flexion 92, AAROM 105 (improved from 50 AROM and 60 AAROM at eval)      05/28/21 Bike x 15 min seat 7 Box knee flexion x 1'  STW and manual Right knee flexion and extension x 15 min in supine and sitting ; all manual interventions performed independently of other interventions   Prone quad stretch with strap 5 x 10"       Carrie EDUCATION: Education details: ice for swelling, pain management Person educated: Carrie Education method: Explanation  Education comprehension: verbalized understanding  HOME EXERCISE PROGRAM: Exercises - Supine Heel Slide with Strap  - 4-5 x daily - 7 x weekly - 2 sets - 10 reps - 10 second hold - Supine Knee Extension Mobilization with Weight  - 4-5 x daily - 7 x weekly - 1 sets - 1 reps - 3-5 minutes holdAccess Code: [email or web address] Exercises Prone Knee Extension Hang - 3 x daily - 7 x weekly - 1 sets - 1 reps - 1-3 min hold    Exercises Supine Heel Slides - 3-5 x daily - 7 x weekly - 2 sets - 20 reps Supine Single Leg Ankle Pumps - 3-5 x daily - 7 x weekly - 2 sets - 20 reps Supine Quad Set (Mirrored) - 3 x daily - 7 x weekly - 2 sets - 20 reps Supine Short Arc Quad (Mirrored) - 3 x daily - 7 x weekly - 2 sets - 10 reps Supine Active Straight Leg Raise - 3 x daily - 7 x weekly - 2 sets - 10 reps Supine Hip Abduction - 3 x daily - 7 x weekly - 2 sets - 20 reps Seated Knee Flexion AAROM - 3 x daily - 7 x weekly - 2 sets - 10 reps - 5 seconds hold Seated Long Arc Quad - 3 x daily - 7 x weekly - 2 sets - 10 reps  Exercises Standing Gastroc Stretch on Step - 2 x daily - 7 x weekly -  5 sets - 1 reps - 20 hold Standing Knee Flexion Stretch on Step - 2 x daily - 7 x weekly - 2 sets - 10 reps Standing Hamstring Stretch with Step - 2 x daily - 7 x weekly - 1 sets - 5 reps - 20 hold    PT Short Term Goals -       PT SHORT TERM GOAL #1   Title Carrie will be able to improve right knee AROM to 0 degrees extension to improve  gait mechanics and standing safety.    Baseline 05/12/21:0 deg extension AAROM    Time 1    Period Months    Status ongoing   Target Date 05/20/21      PT SHORT TERM GOAL #2   Title Carrie will be able to improve right knee flexion AROM to 80 degrees to improve function.    Baseline 04/26/21 AAROM R knee 92 degrees in sitting today.    Time 1    Period Months    Status Met 05/10/21   Target Date 05/20/21      PT SHORT TERM GOAL #3   Title Carrie will be able to ambulate without use of front wheel walker throughout home for improved ADLs.    Baseline 04/19/21: Carrie currently using front wheel walker consistently    Time 1    Period Months    Status Met 05/12/21   Target Date 05/20/21              PT Long Term Goals       PT LONG TERM GOAL #1   Title Carrie will be able to get to 0 degrees extension and at least 110 degrees flexion for right knee AROM for proper mechanics.    Baseline 05/26/21: 0 deg AAROM extension and 100 degrees flexion end of session AAROM  06/03/21 AROM Right knee 100 degrees in sitting   Time 2    Period Months    Status Ongoing   Target Date 06/19/21      PT LONG TERM GOAL #2   Title Carrie will be able to ambulate in community over 30 minutes without assistive devices and with good form.    Baseline 04/19/21: using front wheeled walker at home and community    Time 2    Period Months    Status ongoing   Target Date 06/19/21      PT LONG TERM GOAL #3   Title Carrie will improve FOTO score to at least 69 degrees to show improved functional abilities    Baseline 04/19/21: 55 today    Time 2    Period  Months    Status ongoing   Target Date 06/19/21              Plan -     Clinical Impression Statement Today's session continues to address improving Carrie's mobility and Right knee ROM s/p manipulation 05/20/21. She reports decreased soreness today and walks with a noticeable smoother gait pattern.  Able to make easier revolutions on the bike with less hip substitution/hip hike. Increased Right knee AROM KF to 100; AAROM 110 today.  Carrie will benefit from continued skilled therapy interventions to address deficits and improve functional mobility. Sees MD today after therapy.     Personal Factors and Comorbidities Past/Current Experience;Time since onset of injury/illness/exacerbation    Examination-Activity Limitations Locomotion Level;Carry;Dressing;Hygiene/Grooming;Lift;Stand;Squat;Stairs    Examination-Participation Restrictions Cleaning;Community Activity;Laundry;Occupation;Yard Work    [employer]    PT Frequency 3x / week    PT Duration 8 weeks    PT Treatment/Interventions ADLs/Self Care Home Management;Biofeedback;Gait training;Stair training;Functional mobility training;Therapeutic activities;Therapeutic exercise;Balance training;Neuromuscular re-education;Carrie/family education;Manual techniques;Manual lymph drainage;Scar mobilization;Passive range of motion;Energy conservation;Taping;Vasopneumatic Device;Joint Manipulations    PT Next Visit Plan  Continue to focus on Right knee flexion primarily for now, resume all other exercises as able. Sees MD today.    Consulted and Agree with Plan of Care Carrie  1:48 PM, 06/03/21 Aramis Weil Small Rayvon Brandvold MPT [facility] [place] [phone]

## 2021-06-03 NOTE — Progress Notes (Signed)
The patient is now returning 2 weeks after a manipulation under anesthesia of her right total knee arthroplasty given arthrofibrosis postoperative.  She reports that she is doing well and she only has 2 more therapy treatments left.  She says her therapies have been able to flex her to 110 degrees.  She feels like she is moving and bending better overall and is working on good range of motion and strength. ? ?On my exam today she lacks full extension by few degrees and I can flex her to just past 90 degrees. ? ?I feel is medically necessary to extend physical therapy for 4 more weeks to maximize the outcome from her knee replacement.  She understands this as well and I gave her prescription for physical therapy to extend her treatments.  I would like to see her back in 4 weeks.  We will have an AP and lateral of her right knee at that visit.  I did give her a note to keep her out of work for 4 more weeks. ?

## 2021-06-08 ENCOUNTER — Ambulatory Visit (HOSPITAL_COMMUNITY): Payer: BC Managed Care – PPO

## 2021-06-08 ENCOUNTER — Encounter (HOSPITAL_COMMUNITY): Payer: Self-pay

## 2021-06-08 DIAGNOSIS — G8929 Other chronic pain: Secondary | ICD-10-CM | POA: Diagnosis not present

## 2021-06-08 DIAGNOSIS — M1711 Unilateral primary osteoarthritis, right knee: Secondary | ICD-10-CM

## 2021-06-08 DIAGNOSIS — M25661 Stiffness of right knee, not elsewhere classified: Secondary | ICD-10-CM | POA: Diagnosis not present

## 2021-06-08 DIAGNOSIS — R262 Difficulty in walking, not elsewhere classified: Secondary | ICD-10-CM | POA: Diagnosis not present

## 2021-06-08 DIAGNOSIS — Z96651 Presence of right artificial knee joint: Secondary | ICD-10-CM

## 2021-06-08 DIAGNOSIS — M25561 Pain in right knee: Secondary | ICD-10-CM | POA: Diagnosis not present

## 2021-06-08 NOTE — Therapy (Signed)
?OUTPATIENT PHYSICAL THERAPY TREATMENT NOTE ? ? ?Patient Name: Carrie [name] ?MRN: [id number] ?DOB:Jun 26, 1970, 51 y.o., female ?Today's Date: 06/08/2021 ? ?PCP: Jake Samples, PA-C ?REFERRING PROVIDER: Ninfa Linden ? ? PT End of Session - 06/08/21 0901   ? ? Visit Number 21   ? Number of Visits 24   ? Date for PT Re-Evaluation 06/19/21   ? Authorization Type BCBS no auth needed   ? PT Start Time 0900   ? PT Stop Time 0940   ? PT Time Calculation (min) 40 min   ? Activity Tolerance Patient tolerated treatment well;No increased pain   ? Behavior During Therapy [facility] for tasks assessed/performed   ? ?  ?  ? ?  ? ? ? ? ? ? ? ? ? ? ? ? ? ? ? ? ? ? ? ?Past Medical History:  ?Diagnosis Date  ? Arthritis   ? Arthrofibrosis of right total knee arthroplasty, subsequent encounter 05/20/2021  ? Diabetes mellitus without complication ([place])   ? Essential hypertension   ? GERD (gastroesophageal reflux disease)   ? Mixed hyperlipidemia   ? PONV (postoperative nausea and vomiting)   ? ?Past Surgical History:  ?Procedure Laterality Date  ? ABDOMINAL HYSTERECTOMY    ? HARDWARE REMOVAL Right 04/02/2021  ? Procedure: Right Knee HARDWARE REMOVAL;  Surgeon: Mcarthur Rossetti, MD;  Location: [facility];  Service: Orthopedics;  Laterality: Right;  ? KNEE ARTHROSCOPY  2009  ? KNEE CLOSED REDUCTION Right 05/20/2021  ? Procedure: CLOSED MANIPULATION RIGHT KNEE;  Surgeon: Mcarthur Rossetti, MD;  Location: [place];  Service: Orthopedics;  Laterality: Right;  ? MASS EXCISION  02/11/2011  ? Procedure: EXCISION MASS;  Surgeon: Jessy Oto, MD;  Location: [place];  Service: Orthopedics;  Laterality: Right;  resection of neuroma right infrapatellar medial knee  ? PATELLA ARTHROPLASTY    ? rt knee-  ? TOTAL KNEE ARTHROPLASTY  2011  ? partial-[place] reg   ? TOTAL KNEE ARTHROPLASTY Right 04/02/2021  ? Procedure: Right TOTAL KNEE ARTHROPLASTY;  Surgeon: Mcarthur Rossetti, MD;  Location: [facility];  Service:  Orthopedics;  Laterality: Right;  ? TUBAL LIGATION    ? ?Patient Active Problem List  ? Diagnosis Date Noted  ? Arthrofibrosis of right total knee arthroplasty, subsequent encounter 05/20/2021  ? Pain in right knee 04/19/2021  ? Stiffness of right knee, not elsewhere classified 04/19/2021  ? Difficulty in walking, not elsewhere classified 04/19/2021  ? Status post total knee replacement, right 04/02/2021  ? Allergic rhinitis due to pollen 06/22/2020  ? Eosinophilic esophagitis 08/07/7626  ? Rash and other nonspecific skin eruption 06/22/2020  ? Unilateral primary osteoarthritis, right knee 10/09/2017  ? AC joint arthropathy 01/04/2017  ? Acute pain of right shoulder 11/15/2016  ? Other derangements of patella, unspecified knee 09/02/2015  ? Recurrent dislocation of right patella 09/02/2015  ? Breast hypertrophy in female 05/13/2015  ? Neuroma of lower extremity 02/11/2011  ?  Class: Chronic  ? ? ?REFERRING DIAG: Right TKA  04/02/21 surgery date, manipulation 05/20/21 ? ?THERAPY DIAG:  ?Unilateral primary osteoarthritis, right knee ? ?Status post total knee replacement, right ? ?Stiffness of right knee, not elsewhere classified ? ?Difficulty in walking, not elsewhere classified ? ?PERTINENT HISTORY: Prior right partial replacement 12+ years ago ? ?PRECAUTIONS: none ? ?SUBJECTIVE:  ?Reports she had MD follow up, didn't get as much knee bend and new prescription for 4 more weeks of "aggressive" therapy given.  Overall, soreness is much  better only into lateral knee. Just arrived at home is a new stationary bike.  ? ? ?PAIN:  ?Are you having pain? Yes, 1/10 soreness, Right lateral knee joint ? ? ? ? ?TODAY'S TREATMENT:  ?06/08/21: ?Right knee AROM flexion not formally tested today ?           Knee extension in lying 0 supine AAROM ? ?Bike x 15 mins seat Each 5 mins down a seat level from 5 to 4 to 3  ?Box knee drives for knee flexion at bottom step x 2"   ?Trial of new stair stretch - posterior at stairs, seated at 2nd step  and then dip down 6 reps x 10 second hold  ? ?STW and manual Right knee flexion and extension x 15 min in supine and sitting ; all manual interventions performed independently of other interventions ? - manual STW at gross right knee especially distal lateral quad with grade IV patellar mobilizations all directions ? - manual STW working into flexion with continued patellar and distal quad focus with grade III tibial posterior glide mobilizations x 20 reps ? - manual STW in sitting with knee flexion hangs, followed by PROM knee flexion in sitting x 4 reps with cueing for relaxation ? ? ? ?06/03/21 ? ?Right knee AROM flexion in sitting 100 degrees, 110 degrees AAROM ?           Knee extension in lying 0 supine AAROM ? ?Bike x 15 seat level 5 (started at 6 for first 5 min) ?Box knee drives for knee flexion x 2" ? ?STW and manual Right knee flexion and extension x 15 min in supine and sitting ; all manual interventions performed independently of other interventions ? ? ? ? ?06/01/21 ?Bike x 15 min seat 5 ? ?Box flexion; knee drives x 2 ' ? ? ?STW and manual Right knee flexion and extension x 10 min in supine and sitting ; all manual interventions performed independently of other interventions ? ?Bike another 5 min on seat 4 able to make full revolutions ? ?Right knee AAROM knee extension in supine 0 ? ?Right knee AROM knee flexion 92, AAROM 105 (improved from 50 AROM and 60 AAROM at eval) ? ? ? ? ? ?05/28/21 ?Bike x 15 min seat 7 ?Box knee flexion x 1' ? ?STW and manual Right knee flexion and extension x 15 min in supine and sitting ; all manual interventions performed independently of other interventions ? ? ?Prone quad stretch with strap 5 x 10" ? ? ? ? ? ? ?PATIENT EDUCATION: ?Education details: ice for swelling, pain management ?Person educated: Patient ?Education method: Explanation  ?Education comprehension: verbalized understanding ? ?HOME EXERCISE PROGRAM: ?Exercises ?- Supine Heel Slide with Strap  - 4-5 x daily  - 7 x weekly - 2 sets - 10 reps - 10 second hold ?- Supine Knee Extension Mobilization with Weight  - 4-5 x daily - 7 x weekly - 1 sets - 1 reps - 3-5 minutes holdAccess Code: [email or web address] ?Exercises ?Prone Knee Extension Hang - 3 x daily - 7 x weekly - 1 sets - 1 reps - 1-3 min hold ? ? ? ?Exercises ?Supine Heel Slides - 3-5 x daily - 7 x weekly - 2 sets - 20 reps ?Supine Single Leg Ankle Pumps - 3-5 x daily - 7 x weekly - 2 sets - 20 reps ?Supine Quad Set (Mirrored) - 3 x daily - 7 x weekly - 2 sets - 20 reps ?Supine Short  Arc Quad (Mirrored) - 3 x daily - 7 x weekly - 2 sets - 10 reps ?Supine Active Straight Leg Raise - 3 x daily - 7 x weekly - 2 sets - 10 reps ?Supine Hip Abduction - 3 x daily - 7 x weekly - 2 sets - 20 reps ?Seated Knee Flexion AAROM - 3 x daily - 7 x weekly - 2 sets - 10 reps - 5 seconds hold ?Seated Long Arc Quad - 3 x daily - 7 x weekly - 2 sets - 10 reps ? ?Exercises ?Standing Gastroc Stretch on Step - 2 x daily - 7 x weekly - 5 sets - 1 reps - 20 hold ?Standing Knee Flexion Stretch on Step - 2 x daily - 7 x weekly - 2 sets - 10 reps ?Standing Hamstring Stretch with Step - 2 x daily - 7 x weekly - 1 sets - 5 reps - 20 hold ? ? ? PT Short Term Goals -   ? ?  ? PT SHORT TERM GOAL #1  ? Title Patient will be able to improve right knee AROM to 0 degrees extension to improve gait mechanics and standing safety.   ? Baseline 05/12/21:0 deg extension AAROM   ? Time 1   ? Period Months   ? Status ongoing  ? Target Date 05/20/21   ?  ? PT SHORT TERM GOAL #2  ? Title Patient will be able to improve right knee flexion AROM to 80 degrees to improve function.   ? Baseline 04/26/21 AAROM R knee 92 degrees in sitting today.   ? Time 1   ? Period Months   ? Status Met 05/10/21  ? Target Date 05/20/21   ?  ? PT SHORT TERM GOAL #3  ? Title Patient will be able to ambulate without use of front wheel walker throughout home for improved ADLs.   ? Baseline 04/19/21: Patient currently using front wheel walker consistently    ? Time 1   ? Period Months   ? Status Met 05/12/21  ? Target Date 05/20/21   ? ?  ?  ? ?  ? ? ? PT Long Term Goals   ? ?  ? PT LONG TERM GOAL #1  ? Title Patient will be able to get to 0 degrees exten

## 2021-06-10 ENCOUNTER — Ambulatory Visit (HOSPITAL_COMMUNITY): Payer: BC Managed Care – PPO

## 2021-06-10 ENCOUNTER — Encounter (HOSPITAL_COMMUNITY): Payer: Self-pay

## 2021-06-10 DIAGNOSIS — G8929 Other chronic pain: Secondary | ICD-10-CM | POA: Diagnosis not present

## 2021-06-10 DIAGNOSIS — M1711 Unilateral primary osteoarthritis, right knee: Secondary | ICD-10-CM | POA: Diagnosis not present

## 2021-06-10 DIAGNOSIS — R262 Difficulty in walking, not elsewhere classified: Secondary | ICD-10-CM | POA: Diagnosis not present

## 2021-06-10 DIAGNOSIS — Z96651 Presence of right artificial knee joint: Secondary | ICD-10-CM | POA: Diagnosis not present

## 2021-06-10 DIAGNOSIS — M25561 Pain in right knee: Secondary | ICD-10-CM | POA: Diagnosis not present

## 2021-06-10 DIAGNOSIS — M25661 Stiffness of right knee, not elsewhere classified: Secondary | ICD-10-CM

## 2021-06-10 NOTE — Therapy (Signed)
?OUTPATIENT PHYSICAL THERAPY TREATMENT NOTE ? ? ?Patient Name: Carrie [name] ?MRN: [id number] ?DOB:02-Jan-1971, 51 y.o., female ?Today's Date: 06/10/2021 ? ?PCP: Jake Samples, PA-C ?REFERRING PROVIDER: Ninfa Linden ? ? PT End of Session - 06/10/21 1306   ? ? Visit Number 22   ? Number of Visits 24   ? Date for PT Re-Evaluation 06/19/21   ? Authorization Type BCBS no auth needed   ? PT Start Time 1303   ? PT Stop Time 2130   ? PT Time Calculation (min) 40 min   ? Activity Tolerance Patient tolerated treatment well;No increased pain   ? Behavior During Therapy [facility] for tasks assessed/performed   ? ?  ?  ? ?  ? ? ? ? ? ? ? ? ? ? ? ? ? ? ? ? ? ? ? ?Past Medical History:  ?Diagnosis Date  ? Arthritis   ? Arthrofibrosis of right total knee arthroplasty, subsequent encounter 05/20/2021  ? Diabetes mellitus without complication ([place])   ? Essential hypertension   ? GERD (gastroesophageal reflux disease)   ? Mixed hyperlipidemia   ? PONV (postoperative nausea and vomiting)   ? ?Past Surgical History:  ?Procedure Laterality Date  ? ABDOMINAL HYSTERECTOMY    ? HARDWARE REMOVAL Right 04/02/2021  ? Procedure: Right Knee HARDWARE REMOVAL;  Surgeon: Mcarthur Rossetti, MD;  Location: [facility];  Service: Orthopedics;  Laterality: Right;  ? KNEE ARTHROSCOPY  2009  ? KNEE CLOSED REDUCTION Right 05/20/2021  ? Procedure: CLOSED MANIPULATION RIGHT KNEE;  Surgeon: Mcarthur Rossetti, MD;  Location: [place];  Service: Orthopedics;  Laterality: Right;  ? MASS EXCISION  02/11/2011  ? Procedure: EXCISION MASS;  Surgeon: Jessy Oto, MD;  Location: [place];  Service: Orthopedics;  Laterality: Right;  resection of neuroma right infrapatellar medial knee  ? PATELLA ARTHROPLASTY    ? rt knee-  ? TOTAL KNEE ARTHROPLASTY  2011  ? partial-[place] reg   ? TOTAL KNEE ARTHROPLASTY Right 04/02/2021  ? Procedure: Right TOTAL KNEE ARTHROPLASTY;  Surgeon: Mcarthur Rossetti, MD;  Location: [facility];  Service:  Orthopedics;  Laterality: Right;  ? TUBAL LIGATION    ? ?Patient Active Problem List  ? Diagnosis Date Noted  ? Arthrofibrosis of right total knee arthroplasty, subsequent encounter 05/20/2021  ? Pain in right knee 04/19/2021  ? Stiffness of right knee, not elsewhere classified 04/19/2021  ? Difficulty in walking, not elsewhere classified 04/19/2021  ? Status post total knee replacement, right 04/02/2021  ? Allergic rhinitis due to pollen 06/22/2020  ? Eosinophilic esophagitis 86/57/8469  ? Rash and other nonspecific skin eruption 06/22/2020  ? Unilateral primary osteoarthritis, right knee 10/09/2017  ? AC joint arthropathy 01/04/2017  ? Acute pain of right shoulder 11/15/2016  ? Other derangements of patella, unspecified knee 09/02/2015  ? Recurrent dislocation of right patella 09/02/2015  ? Breast hypertrophy in female 05/13/2015  ? Neuroma of lower extremity 02/11/2011  ?  Class: Chronic  ? ? ?REFERRING DIAG: Right TKA  04/02/21 surgery date, manipulation 05/20/21 ? ?THERAPY DIAG:  ?Unilateral primary osteoarthritis, right knee ? ?Status post total knee replacement, right ? ?Stiffness of right knee, not elsewhere classified ? ?Difficulty in walking, not elsewhere classified ? ?PERTINENT HISTORY: Prior right partial replacement 12+ years ago ? ?PRECAUTIONS: none ? ?SUBJECTIVE:  ?FYI from 06/08/21 new prescription for 4 more weeks of "aggressive" therapy given.  ?06/10/21: today reports she was sore after last session more than usual and stiff  today "maybe the weather" ? ?PAIN:  ?Are you having pain? Yes, 1/10 soreness, Right lateral knee joint ? ? ?OBJECTIVE:  ? ?TODAY'S TREATMENT:  ?06/10/21: ?Right knee AROM flexion not formally tested today ?           Knee extension in lying 0 supine AAROM ? ?Bike x 15 mins seat Each 5 mins down a seat level from 5 to 4 to 3  ?Box knee drives for knee flexion at bottom step x 2"   ?Trial of new stair stretch - posterior at stairs, seated at 2nd step and then dip down 6 reps x 10  second hold  ? ?STW and manual Right knee flexion and extension x 15 min in supine and sitting ; all manual interventions performed independently of other interventions ? - manual STW at gross right knee especially distal lateral quad with grade IV patellar mobilizations all directions ? - manual STW working into flexion with continued patellar and distal quad focus with grade III tibial posterior glide mobilizations x 20 reps ? - manual STW in sitting with knee flexion hangs, followed by PROM knee flexion in sitting x 4 reps with cueing for relaxation ? ?06/08/21: ?Right knee AROM flexion not formally tested today ?           Knee extension in lying 0 supine AAROM ? ?Bike x 15 mins seat Each 5 mins down a seat level from 5 to 4 to 3  ?Box knee drives for knee flexion at bottom step x 2"   ?Trial of new stair stretch - posterior at stairs, seated at 2nd step and then dip down 6 reps x 10 second hold  ? ?STW and manual Right knee flexion and extension x 15 min in supine and sitting ; all manual interventions performed independently of other interventions ? - manual STW at gross right knee especially distal lateral quad with grade IV patellar mobilizations all directions ? - manual STW working into flexion with continued patellar and distal quad focus with grade III tibial posterior glide mobilizations x 20 reps ? - manual STW in sitting with knee flexion hangs, followed by PROM knee flexion in sitting x 4 reps with cueing for relaxation ? ? ? ?06/03/21 ? ?Right knee AROM flexion in sitting 100 degrees, 110 degrees AAROM ?           Knee extension in lying 0 supine AAROM ? ?Bike x 15 seat level 5 (started at 6 for first 5 min) ?Box knee drives for knee flexion x 2" ? ?STW and manual Right knee flexion and extension x 15 min in supine and sitting ; all manual interventions performed independently of other interventions ? ? ? ?PATIENT EDUCATION: ?Education details: ice for swelling, pain management  06/10/21: education on  repeat treatment to test post soreness acceptance ?Person educated: Patient ?Education method: Explanation  ?Education comprehension: verbalized understanding ? ?HOME EXERCISE PROGRAM: ?Exercises ?- Supine Heel Slide with Strap  - 4-5 x daily - 7 x weekly - 2 sets - 10 reps - 10 second hold ?- Supine Knee Extension Mobilization with Weight  - 4-5 x daily - 7 x weekly - 1 sets - 1 reps - 3-5 minutes holdAccess Code: [email or web address] ?Exercises ?Prone Knee Extension Hang - 3 x daily - 7 x weekly - 1 sets - 1 reps - 1-3 min hold ? ? ? ?Exercises ?Supine Heel Slides - 3-5 x daily - 7 x weekly - 2 sets - 20 reps ?Supine Single  Leg Ankle Pumps - 3-5 x daily - 7 x weekly - 2 sets - 20 reps ?Supine Quad Set (Mirrored) - 3 x daily - 7 x weekly - 2 sets - 20 reps ?Supine Short Arc Quad (Mirrored) - 3 x daily - 7 x weekly - 2 sets - 10 reps ?Supine Active Straight Leg Raise - 3 x daily - 7 x weekly - 2 sets - 10 reps ?Supine Hip Abduction - 3 x daily - 7 x weekly - 2 sets - 20 reps ?Seated Knee Flexion AAROM - 3 x daily - 7 x weekly - 2 sets - 10 reps - 5 seconds hold ?Seated Long Arc Quad - 3 x daily - 7 x weekly - 2 sets - 10 reps ? ?Exercises ?Standing Gastroc Stretch on Step - 2 x daily - 7 x weekly - 5 sets - 1 reps - 20 hold ?Standing Knee Flexion Stretch on Step - 2 x daily - 7 x weekly - 2 sets - 10 reps ?Standing Hamstring Stretch with Step - 2 x daily - 7 x weekly - 1 sets - 5 reps - 20 hold ? ? ? PT Short Term Goals -   ? ?  ? PT SHORT TERM GOAL #1  ? Title Patient will be able to improve right knee AROM to 0 degrees extension to improve gait mechanics and standing safety.   ? Baseline 05/12/21:0 deg extension AAROM   ? Time 1   ? Period Months   ? Status ongoing  ? Target Date 05/20/21   ?  ? PT SHORT TERM GOAL #2  ? Title Patient will be able to improve right knee flexion AROM to 80 degrees to improve function.   ? Baseline 04/26/21 AAROM R knee 92 degrees in sitting today.   ? Time 1   ? Period Months   ? Status Met  05/10/21  ? Target Date 05/20/21   ?  ? PT SHORT TERM GOAL #3  ? Title Patient will be able to ambulate without use of front wheel walker throughout home for improved ADLs.   ? Baseline 04/19/21: Patient currently

## 2021-06-11 ENCOUNTER — Ambulatory Visit (HOSPITAL_COMMUNITY): Payer: BC Managed Care – PPO

## 2021-06-11 DIAGNOSIS — R262 Difficulty in walking, not elsewhere classified: Secondary | ICD-10-CM

## 2021-06-11 DIAGNOSIS — Z96651 Presence of right artificial knee joint: Secondary | ICD-10-CM

## 2021-06-11 DIAGNOSIS — M25561 Pain in right knee: Secondary | ICD-10-CM | POA: Diagnosis not present

## 2021-06-11 DIAGNOSIS — M25661 Stiffness of right knee, not elsewhere classified: Secondary | ICD-10-CM

## 2021-06-11 DIAGNOSIS — M1711 Unilateral primary osteoarthritis, right knee: Secondary | ICD-10-CM

## 2021-06-11 DIAGNOSIS — G8929 Other chronic pain: Secondary | ICD-10-CM | POA: Diagnosis not present

## 2021-06-11 NOTE — Therapy (Signed)
OUTPATIENT PHYSICAL THERAPY TREATMENT NOTE Progress Note Reporting Period 04/19/21 to 06/11/21  See note below for Objective Data and Assessment of Progress/Goals.       Patient Name: Carrie [name] MRN: [id number] DOB:[birth year], 51 y.o., female Today's Date: 06/11/2021  PCP: Ladon Applebaum REFERRING PROVIDER: Magnus Ivan   PT End of Session - 06/11/21 0809     Visit Number 23    Number of Visits 24    Date for PT Re-Evaluation 06/19/21    Authorization Type BCBS no auth needed    PT Start Time 0809    PT Stop Time 0900    PT Time Calculation (min) 51 min    Activity Tolerance Patient tolerated treatment well;No increased pain    Behavior During Therapy [facility] for tasks assessed/performed                               Past Medical History:  Diagnosis Date   Arthritis    Arthrofibrosis of right total knee arthroplasty, subsequent encounter 05/20/2021   Diabetes mellitus without complication (HCC)    Essential hypertension    GERD (gastroesophageal reflux disease)    Mixed hyperlipidemia    PONV (postoperative nausea and vomiting)    Past Surgical History:  Procedure Laterality Date   ABDOMINAL HYSTERECTOMY     HARDWARE REMOVAL Right 04/02/2021   Procedure: Right Knee HARDWARE REMOVAL;  Surgeon: Kathryne Hitch, MD;  Location: [facility];  Service: Orthopedics;  Laterality: Right;   KNEE ARTHROSCOPY  2009   KNEE CLOSED REDUCTION Right 05/20/2021   Procedure: CLOSED MANIPULATION RIGHT KNEE;  Surgeon: Kathryne Hitch, MD;  Location: [facility];  Service: Orthopedics;  Laterality: Right;   MASS EXCISION  02/11/2011   Procedure: EXCISION MASS;  Surgeon: Kerrin Champagne, MD;  Location: [facility];  Service: Orthopedics;  Laterality: Right;  resection of neuroma right infrapatellar medial knee   PATELLA ARTHROPLASTY     rt knee-   TOTAL KNEE ARTHROPLASTY  2011   partial-[place] reg    TOTAL KNEE  ARTHROPLASTY Right 04/02/2021   Procedure: Right TOTAL KNEE ARTHROPLASTY;  Surgeon: Kathryne Hitch, MD;  Location: [facility];  Service: Orthopedics;  Laterality: Right;   TUBAL LIGATION     Patient Active Problem List   Diagnosis Date Noted   Arthrofibrosis of right total knee arthroplasty, subsequent encounter 05/20/2021   Pain in right knee 04/19/2021   Stiffness of right knee, not elsewhere classified 04/19/2021   Difficulty in walking, not elsewhere classified 04/19/2021   Status post total knee replacement, right 04/02/2021   Allergic rhinitis due to pollen 06/22/2020   Eosinophilic esophagitis 06/22/2020   Rash and other nonspecific skin eruption 06/22/2020   Unilateral primary osteoarthritis, right knee 10/09/2017   AC joint arthropathy 01/04/2017   Acute pain of right shoulder 11/15/2016   Other derangements of patella, unspecified knee 09/02/2015   Recurrent dislocation of right patella 09/02/2015   Breast hypertrophy in female 05/13/2015   Neuroma of lower extremity 02/11/2011    Class: Chronic    REFERRING DIAG: Right TKA  04/02/21 surgery date, manipulation 05/20/21  THERAPY DIAG:  Unilateral primary osteoarthritis, right knee  Chronic pain of right knee  Status post total knee replacement, right  Stiffness of right knee, not elsewhere classified  Difficulty in walking, not elsewhere classified  PERTINENT HISTORY: Prior right partial replacement 12+ years ago  PRECAUTIONS: none  SUBJECTIVE:  FYI from 06/08/21 new prescription for 4 more weeks of "aggressive" therapy given.  Patient reports some knee soreness and stiffness today.  PAIN:  Are you having pain? Yes, 1/10 soreness, Right lateral knee joint; "catching" occassinally   OBJECTIVE:   TODAY'S TREATMENT:  06/11/21 Bike x 10 min level 5 Knee drives on 12"  box x 2' Knee flexion negative on step x 2'  STW and manual Right knee flexion and extension x 15 min in supine and sitting; some contract  relax in sitting x 5 reps ; all manual interventions performed independently of other interventions;   Bike another 5 min on level 4; noted increased ease of revolution  AROM R knee flexion in sitting 102, AAROM 110   06/10/21: Right knee AROM flexion not formally tested today            Knee extension in lying 0 supine AAROM  Bike x 15 mins seat Each 5 mins down a seat level from 5 to 4 to 3  Box knee drives for knee flexion at bottom step x 2"   Trial of new stair stretch - posterior at stairs, seated at 2nd step and then dip down 6 reps x 10 second hold   STW and manual Right knee flexion and extension x 15 min in supine and sitting ; all manual interventions performed independently of other interventions  - manual STW at gross right knee especially distal lateral quad with grade IV patellar mobilizations all directions  - manual STW working into flexion with continued patellar and distal quad focus with grade III tibial posterior glide mobilizations x 20 reps  - manual STW in sitting with knee flexion hangs, followed by PROM knee flexion in sitting x 4 reps with cueing for relaxation  06/08/21: Right knee AROM flexion not formally tested today            Knee extension in lying 0 supine AAROM  Bike x 15 mins seat Each 5 mins down a seat level from 5 to 4 to 3  Box knee drives for knee flexion at bottom step x 2"   Trial of new stair stretch - posterior at stairs, seated at 2nd step and then dip down 6 reps x 10 second hold   STW and manual Right knee flexion and extension x 15 min in supine and sitting ; all manual interventions performed independently of other interventions  - manual STW at gross right knee especially distal lateral quad with grade IV patellar mobilizations all directions  - manual STW working into flexion with continued patellar and distal quad focus with grade III tibial posterior glide mobilizations x 20 reps  - manual STW in sitting with knee flexion hangs,  followed by PROM knee flexion in sitting x 4 reps with cueing for relaxation    06/03/21  Right knee AROM flexion in sitting 100 degrees, 110 degrees AAROM            Knee extension in lying 0 supine AAROM  Bike x 15 seat level 5 (started at 6 for first 5 min) Box knee drives for knee flexion x 2"  STW and manual Right knee flexion and extension x 15 min in supine and sitting ; all manual interventions performed independently of other interventions    PATIENT EDUCATION: Education details: ice for swelling, pain management  06/10/21: education on repeat treatment to test post soreness acceptance Person educated: Patient Education method: Explanation  Education comprehension: verbalized understanding  HOME EXERCISE  PROGRAM: Exercises - Supine Heel Slide with Strap  - 4-5 x daily - 7 x weekly - 2 sets - 10 reps - 10 second hold - Supine Knee Extension Mobilization with Weight  - 4-5 x daily - 7 x weekly - 1 sets - 1 reps - 3-5 minutes holdAccess Code: [email or web address] Exercises Prone Knee Extension Hang - 3 x daily - 7 x weekly - 1 sets - 1 reps - 1-3 min hold    Exercises Supine Heel Slides - 3-5 x daily - 7 x weekly - 2 sets - 20 reps Supine Single Leg Ankle Pumps - 3-5 x daily - 7 x weekly - 2 sets - 20 reps Supine Quad Set (Mirrored) - 3 x daily - 7 x weekly - 2 sets - 20 reps Supine Short Arc Quad (Mirrored) - 3 x daily - 7 x weekly - 2 sets - 10 reps Supine Active Straight Leg Raise - 3 x daily - 7 x weekly - 2 sets - 10 reps Supine Hip Abduction - 3 x daily - 7 x weekly - 2 sets - 20 reps Seated Knee Flexion AAROM - 3 x daily - 7 x weekly - 2 sets - 10 reps - 5 seconds hold Seated Long Arc Quad - 3 x daily - 7 x weekly - 2 sets - 10 reps  Exercises Standing Gastroc Stretch on Step - 2 x daily - 7 x weekly - 5 sets - 1 reps - 20 hold Standing Knee Flexion Stretch on Step - 2 x daily - 7 x weekly - 2 sets - 10 reps Standing Hamstring Stretch with Step - 2 x daily - 7 x weekly - 1  sets - 5 reps - 20 hold    PT Short Term Goals -       PT SHORT TERM GOAL #1   Title Patient will be able to improve right knee AROM to 0 degrees extension to improve gait mechanics and standing safety.    Baseline 05/12/21:0 deg extension AAROM    Time 1    Period Months    Status ongoing   Target Date 05/20/21      PT SHORT TERM GOAL #2   Title Patient will be able to improve right knee flexion AROM to 80 degrees to improve function.    Baseline 04/26/21 AAROM R knee 92 degrees in sitting today.    Time 1    Period Months    Status Met 05/10/21   Target Date 05/20/21      PT SHORT TERM GOAL #3   Title Patient will be able to ambulate without use of front wheel walker throughout home for improved ADLs.    Baseline 04/19/21: Patient currently using front wheel walker consistently    Time 1    Period Months    Status Met 05/12/21   Target Date 05/20/21              PT Long Term Goals       PT LONG TERM GOAL #1   Title Patient will be able to get to 0 degrees extension and at least 110 degrees flexion for right knee AROM for proper mechanics.    Baseline 05/26/21: 0 deg AAROM extension and 100 degrees flexion end of session AAROM  06/03/21 AROM Right knee 100 degrees in sitting   Time 2    Period Months    Status Ongoing   Target Date 06/19/21      PT LONG  TERM GOAL #2   Title Patient will be able to ambulate in community over 30 minutes without assistive devices and with good form.    Baseline 04/19/21: using front wheeled walker at home and community    Time 2    Period Months    Status ongoing   Target Date 06/19/21      PT LONG TERM GOAL #3   Title Patient will improve FOTO score to at least 69 degrees to show improved functional abilities    Baseline 04/19/21: 55 today    Time 2    Period Months    Status ongoing   Target Date 06/19/21              Plan -     Clinical Impression Statement Today's session continued to focus on aggressive mobility of the  Right knee.  She demonstrates continued stiffness that improves with each exercise and with manual stretching.  Increased Right knee flexion today 102 AROM, 110 AAROM.  Patient will benefit from continued skilled therapy interventions to address deficits and improve functional mobility. Sees MD today after therapy.     Personal Factors and Comorbidities Past/Current Experience;Time since onset of injury/illness/exacerbation    Examination-Activity Limitations Locomotion Level;Carry;Dressing;Hygiene/Grooming;Lift;Stand;Squat;Stairs    Examination-Participation Restrictions Cleaning;Community Activity;Laundry;Occupation;Yard Work    [employer]    PT Frequency 3x / week    PT Duration 8 weeks    PT Treatment/Interventions ADLs/Self Care Home Management;Biofeedback;Gait training;Stair training;Functional mobility training;Therapeutic activities;Therapeutic exercise;Balance training;Neuromuscular re-education;Patient/family education;Manual techniques;Manual lymph drainage;Scar mobilization;Passive range of motion;Energy conservation;Taping;Vasopneumatic Device;Joint Manipulations    PT Next Visit Plan  Continue to focus on Right knee flexion primarily for now, build more aggressive deep weight bearing flexion and increased OP manual as needed   Consulted and Agree with Plan of Care Patient             9:03 AM, 06/11/21 Sha Burling Small Edit Ricciardelli MPT [place] [facility] [place] [phone] Ph:[phone]

## 2021-06-14 DIAGNOSIS — E119 Type 2 diabetes mellitus without complications: Secondary | ICD-10-CM | POA: Diagnosis not present

## 2021-06-15 ENCOUNTER — Ambulatory Visit (HOSPITAL_COMMUNITY): Payer: BC Managed Care – PPO | Attending: Orthopaedic Surgery | Admitting: Physical Therapy

## 2021-06-15 ENCOUNTER — Encounter (HOSPITAL_COMMUNITY): Payer: Self-pay | Admitting: Physical Therapy

## 2021-06-15 DIAGNOSIS — M25661 Stiffness of right knee, not elsewhere classified: Secondary | ICD-10-CM | POA: Insufficient documentation

## 2021-06-15 DIAGNOSIS — M25561 Pain in right knee: Secondary | ICD-10-CM | POA: Diagnosis not present

## 2021-06-15 DIAGNOSIS — Z96651 Presence of right artificial knee joint: Secondary | ICD-10-CM | POA: Diagnosis not present

## 2021-06-15 DIAGNOSIS — G8929 Other chronic pain: Secondary | ICD-10-CM | POA: Diagnosis not present

## 2021-06-15 DIAGNOSIS — M1711 Unilateral primary osteoarthritis, right knee: Secondary | ICD-10-CM | POA: Insufficient documentation

## 2021-06-15 DIAGNOSIS — R262 Difficulty in walking, not elsewhere classified: Secondary | ICD-10-CM | POA: Insufficient documentation

## 2021-06-15 NOTE — Patient Instructions (Signed)
Exercises ?- Sit to Stand with Arms Crossed  - 1 x daily - 7 x weekly - 3 sets - 10 reps ?- Lateral Step Down  - 1 x daily - 7 x weekly - 3 sets - 10 reps ?

## 2021-06-15 NOTE — Therapy (Signed)
?OUTPATIENT PHYSICAL THERAPY TREATMENT NOTE ? ? ? ? ?Patient Name: Carrie [name] ?MRN: [id number] ?DOB:11-10-1970, 51 y.o., female ?Today's Date: 06/15/2021 ? ?PCP: Jake Samples, PA-C ?REFERRING PROVIDER: Ninfa Linden ? ? PT End of Session - 06/15/21 0829   ? ? Visit Number 24   ? Number of Visits 36   ? Date for PT Re-Evaluation 07/13/21   ? Authorization Type BCBS no auth needed   ? PT Start Time 0830   ? PT Stop Time 0910   ? PT Time Calculation (min) 40 min   ? Activity Tolerance Patient tolerated treatment well;No increased pain   ? Behavior During Therapy [facility] for tasks assessed/performed   ? ?  ?  ? ?  ? ? ? ? ? ? ? ? ? ? ? ? ? ? ? ? ? ? ? ? ?Past Medical History:  ?Diagnosis Date  ? Arthritis   ? Arthrofibrosis of right total knee arthroplasty, subsequent encounter 05/20/2021  ? Diabetes mellitus without complication ([place])   ? Essential hypertension   ? GERD (gastroesophageal reflux disease)   ? Mixed hyperlipidemia   ? PONV (postoperative nausea and vomiting)   ? ?Past Surgical History:  ?Procedure Laterality Date  ? ABDOMINAL HYSTERECTOMY    ? HARDWARE REMOVAL Right 04/02/2021  ? Procedure: Right Knee HARDWARE REMOVAL;  Surgeon: Mcarthur Rossetti, MD;  Location: [facility];  Service: Orthopedics;  Laterality: Right;  ? KNEE ARTHROSCOPY  2009  ? KNEE CLOSED REDUCTION Right 05/20/2021  ? Procedure: CLOSED MANIPULATION RIGHT KNEE;  Surgeon: Mcarthur Rossetti, MD;  Location: [place];  Service: Orthopedics;  Laterality: Right;  ? MASS EXCISION  02/11/2011  ? Procedure: EXCISION MASS;  Surgeon: Jessy Oto, MD;  Location: [place];  Service: Orthopedics;  Laterality: Right;  resection of neuroma right infrapatellar medial knee  ? PATELLA ARTHROPLASTY    ? rt knee-  ? TOTAL KNEE ARTHROPLASTY  2011  ? partial- reg   ? TOTAL KNEE ARTHROPLASTY Right 04/02/2021  ? Procedure: Right TOTAL KNEE ARTHROPLASTY;  Surgeon: Mcarthur Rossetti, MD;  Location: [facility];   Service: Orthopedics;  Laterality: Right;  ? TUBAL LIGATION    ? ?Patient Active Problem List  ? Diagnosis Date Noted  ? Arthrofibrosis of right total knee arthroplasty, subsequent encounter 05/20/2021  ? Pain in right knee 04/19/2021  ? Stiffness of right knee, not elsewhere classified 04/19/2021  ? Difficulty in walking, not elsewhere classified 04/19/2021  ? Status post total knee replacement, right 04/02/2021  ? Allergic rhinitis due to pollen 06/22/2020  ? Eosinophilic esophagitis 88/89/1694  ? Rash and other nonspecific skin eruption 06/22/2020  ? Unilateral primary osteoarthritis, right knee 10/09/2017  ? AC joint arthropathy 01/04/2017  ? Acute pain of right shoulder 11/15/2016  ? Other derangements of patella, unspecified knee 09/02/2015  ? Recurrent dislocation of right patella 09/02/2015  ? Breast hypertrophy in female 05/13/2015  ? Neuroma of lower extremity 02/11/2011  ?  Class: Chronic  ? ? ?REFERRING DIAG: Right TKA  04/02/21 surgery date, manipulation 05/20/21 ? ?THERAPY DIAG:  ?Unilateral primary osteoarthritis, right knee ? ?Chronic pain of right knee ? ?Status post total knee replacement, right ? ?Stiffness of right knee, not elsewhere classified ? ?Difficulty in walking, not elsewhere classified ? ?PERTINENT HISTORY: Prior right partial replacement 12+ years ago ? ?PRECAUTIONS: none ? ?SUBJECTIVE:  ?Continues to have stiffness ? ?PAIN:  ?Are you having pain? Yes, 0/10, some stiffness ? ? ?OBJECTIVE:  ? ?  TODAY'S TREATMENT:  ?06/15/21:  ?R knee AROM: lacking 2 to 101, AAROM: 104 with heel slides; R knee ext MMT 4+/5 ?Bike x 5 minutes level 5  ?Heel slides 10 x 10 second holds in supine RLE ?Lateral step down 4 inch step 3x 10 eccentric control ?Squat with bilateral UE support 3x10 ?STS 2x 10 on 18 inch box ?Standing hamstring curl 3# 3x 10  ? ? ?06/11/21 ?Bike x 10 min level 5 ?Knee drives on 12"  box x 2' ?Knee flexion negative on step x 2' ? ?STW and manual Right knee flexion and extension x 15 min in  supine and sitting; some contract relax in sitting x 5 reps ; all manual interventions performed independently of other interventions;  ? ?Bike another 5 min on level 4; noted increased ease of revolution ? ?AROM R knee flexion in sitting 102, AAROM 110 ? ? ?06/10/21: ?Right knee AROM flexion not formally tested today ?           Knee extension in lying 0 supine AAROM ? ?Bike x 15 mins seat Each 5 mins down a seat level from 5 to 4 to 3  ?Box knee drives for knee flexion at bottom step x 2"   ?Trial of new stair stretch - posterior at stairs, seated at 2nd step and then dip down 6 reps x 10 second hold  ? ?STW and manual Right knee flexion and extension x 15 min in supine and sitting ; all manual interventions performed independently of other interventions ? - manual STW at gross right knee especially distal lateral quad with grade IV patellar mobilizations all directions ? - manual STW working into flexion with continued patellar and distal quad focus with grade III tibial posterior glide mobilizations x 20 reps ? - manual STW in sitting with knee flexion hangs, followed by PROM knee flexion in sitting x 4 reps with cueing for relaxation ? ?06/08/21: ?Right knee AROM flexion not formally tested today ?           Knee extension in lying 0 supine AAROM ? ?Bike x 15 mins seat Each 5 mins down a seat level from 5 to 4 to 3  ?Box knee drives for knee flexion at bottom step x 2"   ?Trial of new stair stretch - posterior at stairs, seated at 2nd step and then dip down 6 reps x 10 second hold  ? ?STW and manual Right knee flexion and extension x 15 min in supine and sitting ; all manual interventions performed independently of other interventions ? - manual STW at gross right knee especially distal lateral quad with grade IV patellar mobilizations all directions ? - manual STW working into flexion with continued patellar and distal quad focus with grade III tibial posterior glide mobilizations x 20 reps ? - manual STW in  sitting with knee flexion hangs, followed by PROM knee flexion in sitting x 4 reps with cueing for relaxation ? ? ? ? ? ?PATIENT EDUCATION: ?Education details: ice for swelling, pain management  06/10/21: education on repeat treatment to test post soreness acceptance 5/2 HEP ?Person educated: Patient ?Education method: Explanation  ?Education comprehension: verbalized understanding ? ?HOME EXERCISE PROGRAM: ?Exercises ?- Supine Heel Slide with Strap  - 4-5 x daily - 7 x weekly - 2 sets - 10 reps - 10 second hold ?- Supine Knee Extension Mobilization with Weight  - 4-5 x daily - 7 x weekly - 1 sets - 1 reps - 3-5 minutes  holdAccess Code: [email or web address] ?Exercises ?Prone Knee Extension Hang - 3 x daily - 7 x weekly - 1 sets - 1 reps - 1-3 min hold ? ?Exercises ?Supine Heel Slides - 3-5 x daily - 7 x weekly - 2 sets - 20 reps ?Supine Single Leg Ankle Pumps - 3-5 x daily - 7 x weekly - 2 sets - 20 reps ?Supine Quad Set (Mirrored) - 3 x daily - 7 x weekly - 2 sets - 20 reps ?Supine Short Arc Quad (Mirrored) - 3 x daily - 7 x weekly - 2 sets - 10 reps ?Supine Active Straight Leg Raise - 3 x daily - 7 x weekly - 2 sets - 10 reps ?Supine Hip Abduction - 3 x daily - 7 x weekly - 2 sets - 20 reps ?Seated Knee Flexion AAROM - 3 x daily - 7 x weekly - 2 sets - 10 reps - 5 seconds hold ?Seated Long Arc Quad - 3 x daily - 7 x weekly - 2 sets - 10 reps ? ?Exercises ?Standing Gastroc Stretch on Step - 2 x daily - 7 x weekly - 5 sets - 1 reps - 20 hold ?Standing Knee Flexion Stretch on Step - 2 x daily - 7 x weekly - 2 sets - 10 reps ?Standing Hamstring Stretch with Step - 2 x daily - 7 x weekly - 1 sets - 5 reps - 20 hold ? ?Exercises ?- Sit to Stand with Arms Crossed  - 1 x daily - 7 x weekly - 3 sets - 10 reps ?- Lateral Step Down  - 1 x daily - 7 x weekly - 3 sets - 10 reps ? ? PT Short Term Goals -   ? ?  ? PT SHORT TERM GOAL #1  ? Title Patient will be able to improve right knee AROM to 0 degrees extension to improve gait mechanics  and standing safety.   ? Baseline 05/12/21:0 deg extension AAROM   ? Time 1   ? Period Months   ? Status ongoing  ? Target Date 05/20/21   ?  ? PT SHORT TERM GOAL #2  ? Title Patient will be able to improve righ

## 2021-06-16 ENCOUNTER — Ambulatory Visit (HOSPITAL_COMMUNITY): Payer: BC Managed Care – PPO | Admitting: Physical Therapy

## 2021-06-16 DIAGNOSIS — M25661 Stiffness of right knee, not elsewhere classified: Secondary | ICD-10-CM | POA: Diagnosis not present

## 2021-06-16 DIAGNOSIS — R262 Difficulty in walking, not elsewhere classified: Secondary | ICD-10-CM | POA: Diagnosis not present

## 2021-06-16 DIAGNOSIS — Z96651 Presence of right artificial knee joint: Secondary | ICD-10-CM

## 2021-06-16 DIAGNOSIS — M25561 Pain in right knee: Secondary | ICD-10-CM | POA: Diagnosis not present

## 2021-06-16 DIAGNOSIS — M1711 Unilateral primary osteoarthritis, right knee: Secondary | ICD-10-CM

## 2021-06-16 DIAGNOSIS — G8929 Other chronic pain: Secondary | ICD-10-CM

## 2021-06-16 NOTE — Therapy (Signed)
?OUTPATIENT PHYSICAL THERAPY TREATMENT NOTE ? ? ? ? ?Patient Name: Carrie [name] ?MRN: [id number] ?DOB:1970-03-01, 51 y.o., female ?Today's Date: 06/16/2021 ? ?PCP: Jake Samples, PA-C ?REFERRING PROVIDER: Ninfa Linden ? ? PT End of Session - 06/16/21 1620   ? ? Visit Number 25   ? Number of Visits 36   ? Date for PT Re-Evaluation 07/13/21   ? Authorization Type BCBS no auth needed   ? PT Start Time 1616   ? PT Stop Time 1655   ? PT Time Calculation (min) 39 min   ? Activity Tolerance Patient tolerated treatment well;No increased pain   ? Behavior During Therapy [facility] for tasks assessed/performed   ? ?  ?  ? ?  ? ? ? ? ? ? ? ? ? ? ? ? ? ? ? ? ? ? ? ? ?Past Medical History:  ?Diagnosis Date  ? Arthritis   ? Arthrofibrosis of right total knee arthroplasty, subsequent encounter 05/20/2021  ? Diabetes mellitus without complication ([place])   ? Essential hypertension   ? GERD (gastroesophageal reflux disease)   ? Mixed hyperlipidemia   ? PONV (postoperative nausea and vomiting)   ? ?Past Surgical History:  ?Procedure Laterality Date  ? ABDOMINAL HYSTERECTOMY    ? HARDWARE REMOVAL Right 04/02/2021  ? Procedure: Right Knee HARDWARE REMOVAL;  Surgeon: Mcarthur Rossetti, MD;  Location: [facility];  Service: Orthopedics;  Laterality: Right;  ? KNEE ARTHROSCOPY  2009  ? KNEE CLOSED REDUCTION Right 05/20/2021  ? Procedure: CLOSED MANIPULATION RIGHT KNEE;  Surgeon: Mcarthur Rossetti, MD;  Location: [place];  Service: Orthopedics;  Laterality: Right;  ? MASS EXCISION  02/11/2011  ? Procedure: EXCISION MASS;  Surgeon: Jessy Oto, MD;  Location: [place];  Service: Orthopedics;  Laterality: Right;  resection of neuroma right infrapatellar medial knee  ? PATELLA ARTHROPLASTY    ? rt knee-  ? TOTAL KNEE ARTHROPLASTY  2011  ? partial-[place] reg   ? TOTAL KNEE ARTHROPLASTY Right 04/02/2021  ? Procedure: Right TOTAL KNEE ARTHROPLASTY;  Surgeon: Mcarthur Rossetti, MD;  Location: [facility];   Service: Orthopedics;  Laterality: Right;  ? TUBAL LIGATION    ? ?Patient Active Problem List  ? Diagnosis Date Noted  ? Arthrofibrosis of right total knee arthroplasty, subsequent encounter 05/20/2021  ? Pain in right knee 04/19/2021  ? Stiffness of right knee, not elsewhere classified 04/19/2021  ? Difficulty in walking, not elsewhere classified 04/19/2021  ? Status post total knee replacement, right 04/02/2021  ? Allergic rhinitis due to pollen 06/22/2020  ? Eosinophilic esophagitis 44/31/5400  ? Rash and other nonspecific skin eruption 06/22/2020  ? Unilateral primary osteoarthritis, right knee 10/09/2017  ? AC joint arthropathy 01/04/2017  ? Acute pain of right shoulder 11/15/2016  ? Other derangements of patella, unspecified knee 09/02/2015  ? Recurrent dislocation of right patella 09/02/2015  ? Breast hypertrophy in female 05/13/2015  ? Neuroma of lower extremity 02/11/2011  ?  Class: Chronic  ? ? ?REFERRING DIAG: Right TKA  04/02/21 surgery date, manipulation 05/20/21 ? ?THERAPY DIAG:  ?Unilateral primary osteoarthritis, right knee ? ?Chronic pain of right knee ? ?Status post total knee replacement, right ? ?Stiffness of right knee, not elsewhere classified ? ?Difficulty in walking, not elsewhere classified ? ?PERTINENT HISTORY: Prior right partial replacement 12+ years ago ? ?PRECAUTIONS: none ? ?SUBJECTIVE:  ?Continues to have stiffness ? ?PAIN:  ?Are you having pain? No, just soreness and tightness ? ? ?OBJECTIVE:  ? ?  TODAY'S TREATMENT:  ?06/16/21 ?R knee supine AROM: lacking 2 to 104, AAROM: 107  ?Bike x 5 minutes seat 4 ?Standing 12" Rt knee flexion stretch 10X10" ?Standing hamstring curl 3# 3x 10 ?Squat with bilateral UE support 3x10  ?Lateral step down 4" 3x 10 eccentric control ?Forward step down 4" 2X10 eccentric control with 1 HHA ?STS 2x 10 on 18 inch box Rt closer back to body ?Prone: contract relax for flexion 3X ?Supine heelslides prior to [facility] measurement ?Manual: scar massage to lateral knee and  along scar line ? ?06/15/21:  ?R knee AROM: lacking 2 to 101, AAROM: 104 with heel slides; R knee ext MMT 4+/5 ?Bike x 5 minutes level 5  ?Heel slides 10 x 10 second holds in supine RLE ?Lateral step down 4 inch step 3x 10 eccentric control ?Squat with bilateral UE support 3x10 ?STS 2x 10 on 18 inch box ?Standing hamstring curl 3# 3x 10  ? ? ?06/11/21 ?Bike x 10 min level 5 ?Knee drives on 12"  box x 2' ?Knee flexion negative on step x 2' ? ?STW and manual Right knee flexion and extension x 15 min in supine and sitting; some contract relax in sitting x 5 reps ; all manual interventions performed independently of other interventions;  ? ?Bike another 5 min on level 4; noted increased ease of revolution ? ?AROM R knee flexion in sitting 102, AAROM 110 ? ? ?06/10/21: ?Right knee AROM flexion not formally tested today ?           Knee extension in lying 0 supine AAROM ? ?Bike x 15 mins seat Each 5 mins down a seat level from 5 to 4 to 3  ?Box knee drives for knee flexion at bottom step x 2"   ?Trial of new stair stretch - posterior at stairs, seated at 2nd step and then dip down 6 reps x 10 second hold  ? ?STW and manual Right knee flexion and extension x 15 min in supine and sitting ; all manual interventions performed independently of other interventions ? - manual STW at gross right knee especially distal lateral quad with grade IV patellar mobilizations all directions ? - manual STW working into flexion with continued patellar and distal quad focus with grade III tibial posterior glide mobilizations x 20 reps ? - manual STW in sitting with knee flexion hangs, followed by PROM knee flexion in sitting x 4 reps with cueing for relaxation ? ?06/08/21: ?Right knee AROM flexion not formally tested today ?           Knee extension in lying 0 supine AAROM ? ?Bike x 15 mins seat Each 5 mins down a seat level from 5 to 4 to 3  ?Box knee drives for knee flexion at bottom step x 2"   ?Trial of new stair stretch - posterior at  stairs, seated at 2nd step and then dip down 6 reps x 10 second hold  ? ?STW and manual Right knee flexion and extension x 15 min in supine and sitting ; all manual interventions performed independently of other interventions ? - manual STW at gross right knee especially distal lateral quad with grade IV patellar mobilizations all directions ? - manual STW working into flexion with continued patellar and distal quad focus with grade III tibial posterior glide mobilizations x 20 reps ? - manual STW in sitting with knee flexion hangs, followed by PROM knee flexion in sitting x 4 reps with cueing for relaxation ? ? ? ? ? ?  PATIENT EDUCATION: ?Education details: ice for swelling, pain management  06/10/21: education on repeat treatment to test post soreness acceptance 5/2 HEP ?Person educated: Patient ?Education method: Explanation  ?Education comprehension: verbalized understanding ? ?HOME EXERCISE PROGRAM: ?Exercises ?- Supine Heel Slide with Strap  - 4-5 x daily - 7 x weekly - 2 sets - 10 reps - 10 second hold ?- Supine Knee Extension Mobilization with Weight  - 4-5 x daily - 7 x weekly - 1 sets - 1 reps - 3-5 minutes holdAccess Code: [email or web address] ?Exercises ?Prone Knee Extension Hang - 3 x daily - 7 x weekly - 1 sets - 1 reps - 1-3 min hold ? ?Exercises ?Supine Heel Slides - 3-5 x daily - 7 x weekly - 2 sets - 20 reps ?Supine Single Leg Ankle Pumps - 3-5 x daily - 7 x weekly - 2 sets - 20 reps ?Supine Quad Set (Mirrored) - 3 x daily - 7 x weekly - 2 sets - 20 reps ?Supine Short Arc Quad (Mirrored) - 3 x daily - 7 x weekly - 2 sets - 10 reps ?Supine Active Straight Leg Raise - 3 x daily - 7 x weekly - 2 sets - 10 reps ?Supine Hip Abduction - 3 x daily - 7 x weekly - 2 sets - 20 reps ?Seated Knee Flexion AAROM - 3 x daily - 7 x weekly - 2 sets - 10 reps - 5 seconds hold ?Seated Long Arc Quad - 3 x daily - 7 x weekly - 2 sets - 10 reps ? ?Exercises ?Standing Gastroc Stretch on Step - 2 x daily - 7 x weekly - 5 sets - 1 reps  - 20 hold ?Standing Knee Flexion Stretch on Step - 2 x daily - 7 x weekly - 2 sets - 10 reps ?Standing Hamstring Stretch with Step - 2 x daily - 7 x weekly - 1 sets - 5 reps - 20 hold ? ?Exercises ?- Sit to Stand

## 2021-06-17 DIAGNOSIS — Z6828 Body mass index (BMI) 28.0-28.9, adult: Secondary | ICD-10-CM | POA: Diagnosis not present

## 2021-06-17 DIAGNOSIS — H6120 Impacted cerumen, unspecified ear: Secondary | ICD-10-CM | POA: Diagnosis not present

## 2021-06-17 DIAGNOSIS — E663 Overweight: Secondary | ICD-10-CM | POA: Diagnosis not present

## 2021-06-17 DIAGNOSIS — L918 Other hypertrophic disorders of the skin: Secondary | ICD-10-CM | POA: Diagnosis not present

## 2021-06-18 ENCOUNTER — Ambulatory Visit (HOSPITAL_COMMUNITY): Payer: BC Managed Care – PPO | Admitting: Physical Therapy

## 2021-06-18 DIAGNOSIS — R262 Difficulty in walking, not elsewhere classified: Secondary | ICD-10-CM | POA: Diagnosis not present

## 2021-06-18 DIAGNOSIS — G8929 Other chronic pain: Secondary | ICD-10-CM

## 2021-06-18 DIAGNOSIS — M25561 Pain in right knee: Secondary | ICD-10-CM | POA: Diagnosis not present

## 2021-06-18 DIAGNOSIS — M1711 Unilateral primary osteoarthritis, right knee: Secondary | ICD-10-CM | POA: Diagnosis not present

## 2021-06-18 DIAGNOSIS — M25661 Stiffness of right knee, not elsewhere classified: Secondary | ICD-10-CM | POA: Diagnosis not present

## 2021-06-18 DIAGNOSIS — Z96651 Presence of right artificial knee joint: Secondary | ICD-10-CM | POA: Diagnosis not present

## 2021-06-18 NOTE — Therapy (Signed)
?OUTPATIENT PHYSICAL THERAPY TREATMENT NOTE ? ? ? ? ?Patient Name: Carrie [name] ?MRN: [id number] ?DOB:04/22/1970, 51 y.o., female ?Today's Date: 06/18/2021 ? ?PCP: Jake Samples, PA-C ?REFERRING PROVIDER: Ninfa Linden ? ? PT End of Session - 06/18/21 1438   ? ? Visit Number 26   ? Number of Visits 36   ? Date for PT Re-Evaluation 07/13/21   ? Authorization Type BCBS no auth needed   ? PT Start Time 1440   ? PT Stop Time 1525   ? PT Time Calculation (min) 45 min   ? Activity Tolerance Patient tolerated treatment well;No increased pain   ? Behavior During Therapy [facility] for tasks assessed/performed   ? ?  ?  ? ?  ? ? ? ?Past Medical History:  ?Diagnosis Date  ? Arthritis   ? Arthrofibrosis of right total knee arthroplasty, subsequent encounter 05/20/2021  ? Diabetes mellitus without complication ([place])   ? Essential hypertension   ? GERD (gastroesophageal reflux disease)   ? Mixed hyperlipidemia   ? PONV (postoperative nausea and vomiting)   ? ?Past Surgical History:  ?Procedure Laterality Date  ? ABDOMINAL HYSTERECTOMY    ? HARDWARE REMOVAL Right 04/02/2021  ? Procedure: Right Knee HARDWARE REMOVAL;  Surgeon: Mcarthur Rossetti, MD;  Location: [facility];  Service: Orthopedics;  Laterality: Right;  ? KNEE ARTHROSCOPY  2009  ? KNEE CLOSED REDUCTION Right 05/20/2021  ? Procedure: CLOSED MANIPULATION RIGHT KNEE;  Surgeon: Mcarthur Rossetti, MD;  Location: [place];  Service: Orthopedics;  Laterality: Right;  ? MASS EXCISION  02/11/2011  ? Procedure: EXCISION MASS;  Surgeon: Jessy Oto, MD;  Location: [place];  Service: Orthopedics;  Laterality: Right;  resection of neuroma right infrapatellar medial knee  ? PATELLA ARTHROPLASTY    ? rt knee-  ? TOTAL KNEE ARTHROPLASTY  2011  ? partial-[place] reg   ? TOTAL KNEE ARTHROPLASTY Right 04/02/2021  ? Procedure: Right TOTAL KNEE ARTHROPLASTY;  Surgeon: Mcarthur Rossetti, MD;  Location[facility];  Service: Orthopedics;  Laterality:  Right;  ? TUBAL LIGATION    ? ?Patient Active Problem List  ? Diagnosis Date Noted  ? Arthrofibrosis of right total knee arthroplasty, subsequent encounter 05/20/2021  ? Pain in right knee 04/19/2021  ? Stiffness of right knee, not elsewhere classified 04/19/2021  ? Difficulty in walking, not elsewhere classified 04/19/2021  ? Status post total knee replacement, right 04/02/2021  ? Allergic rhinitis due to pollen 06/22/2020  ? Eosinophilic esophagitis 20/35/5974  ? Rash and other nonspecific skin eruption 06/22/2020  ? Unilateral primary osteoarthritis, right knee 10/09/2017  ? AC joint arthropathy 01/04/2017  ? Acute pain of right shoulder 11/15/2016  ? Other derangements of patella, unspecified knee 09/02/2015  ? Recurrent dislocation of right patella 09/02/2015  ? Breast hypertrophy in female 05/13/2015  ? Neuroma of lower extremity 02/11/2011  ?  Class: Chronic  ? ? ?REFERRING DIAG: Right TKA  04/02/21 surgery date, manipulation 05/20/21 ? ?THERAPY DIAG:  ?Chronic pain of right knee ? ?Stiffness of right knee, not elsewhere classified ? ?Difficulty in walking, not elsewhere classified ? ?PERTINENT HISTORY: Prior right partial replacement 12+ years ago ? ?PRECAUTIONS: none ? ?SUBJECTIVE:  ?No complaints or questions  ?PAIN:  ?Are you having pain? No, just soreness and tightness ? ? ?OBJECTIVE:  ? ?TODAY'S TREATMENT:  AROM 5-106 ?06/18/2021 ?Bike x 5' seat at 4  ?Leg press 3 PL one hole showing x 10  ?Standing : ?Knee flexion x  10 ?Knee driver on 12"  x 3 for 30" ?Terminal extension x 10 ?Functional squat picking ball of 4" step x 5 ?Rocker board x 2' ?Prone: ?Quad stretch 30" x 3 ?Knee flexion x 10 ?Contract relax x 3  ?Supine: ?Quad set: x 10 ?Heelslide :  hold for 3 deep breaths then pull back further x 5  ?Bridge x 10 making sure Rt hip stays even with Lt ? ?06/16/21 ?R knee supine AROM: lacking 2 to 104, AAROM: 107  ?Bike x 5 minutes seat 4 ?Standing 12" Rt knee flexion stretch 10X10" ?Standing hamstring curl 3# 3x  10 ?Squat with bilateral UE support 3x10  ?Lateral step down 4" 3x 10 eccentric control ?Forward step down 4" 2X10 eccentric control with 1 HHA ?STS 2x 10 on 18 inch box Rt closer back to body ?Prone: contract relax for flexion 3X ?Supine heelslides prior to [facility] measurement ?Manual: scar massage to lateral knee and along scar line ? ?06/15/21:  ?R knee AROM: lacking 2 to 101, AAROM: 104 with heel slides; R knee ext MMT 4+/5 ?Bike x 5 minutes level 5  ?Heel slides 10 x 10 second holds in supine RLE ?Lateral step down 4 inch step 3x 10 eccentric control ?Squat with bilateral UE support 3x10 ?STS 2x 10 on 18 inch box ?Standing hamstring curl 3# 3x 10  ? ? ?06/11/21 ?Bike x 10 min level 5 ?Knee drives on 12"  box x 2' ?Knee flexion negative on step x 2' ? ?STW and manual Right knee flexion and extension x 15 min in supine and sitting; some contract relax in sitting x 5 reps ; all manual interventions performed independently of other interventions;  ? ?Bike another 5 min on level 4; noted increased ease of revolution ? ?AROM R knee flexion in sitting 102, AAROM 110 ? ? ?06/10/21: ?Right knee AROM flexion not formally tested today ?           Knee extension in lying 0 supine AAROM ? ?Bike x 15 mins seat Each 5 mins down a seat level from 5 to 4 to 3  ?Box knee drives for knee flexion at bottom step x 2"   ?Trial of new stair stretch - posterior at stairs, seated at 2nd step and then dip down 6 reps x 10 second hold  ? ?STW and manual Right knee flexion and extension x 15 min in supine and sitting ; all manual interventions performed independently of other interventions ? - manual STW at gross right knee especially distal lateral quad with grade IV patellar mobilizations all directions ? - manual STW working into flexion with continued patellar and distal quad focus with grade III tibial posterior glide mobilizations x 20 reps ? - manual STW in sitting with knee flexion hangs, followed by PROM knee flexion in sitting x 4  reps with cueing for relaxation ? ?06/08/21: ?Right knee AROM flexion not formally tested today ?           Knee extension in lying 0 supine AAROM ? ?Bike x 15 mins seat Each 5 mins down a seat level from 5 to 4 to 3  ?Box knee drives for knee flexion at bottom step x 2"   ?Trial of new stair stretch - posterior at stairs, seated at 2nd step and then dip down 6 reps x 10 second hold  ? ?STW and manual Right knee flexion and extension x 15 min in supine and sitting ; all manual interventions performed independently of  other interventions ? - manual STW at gross right knee especially distal lateral quad with grade IV patellar mobilizations all directions ? - manual STW working into flexion with continued patellar and distal quad focus with grade III tibial posterior glide mobilizations x 20 reps ? - manual STW in sitting with knee flexion hangs, followed by PROM knee flexion in sitting x 4 reps with cueing for relaxation ? ? ? ? ? ?PATIENT EDUCATION: ?Education details: ice for swelling, pain management  06/10/21: education on repeat treatment to test post soreness acceptance 5/2 HEP ?Person educated: Patient ?Education method: Explanation  ?Education comprehension: verbalized understanding ? ?HOME EXERCISE PROGRAM: ?Exercises ?- Supine Heel Slide with Strap  - 4-5 x daily - 7 x weekly - 2 sets - 10 reps - 10 second hold ?- Supine Knee Extension Mobilization with Weight  - 4-5 x daily - 7 x weekly - 1 sets - 1 reps - 3-5 minutes holdAccess Code: [email or web address] ?Exercises ?Prone Knee Extension Hang - 3 x daily - 7 x weekly - 1 sets - 1 reps - 1-3 min hold ? ?Exercises ?Supine Heel Slides - 3-5 x daily - 7 x weekly - 2 sets - 20 reps ?Supine Single Leg Ankle Pumps - 3-5 x daily - 7 x weekly - 2 sets - 20 reps ?Supine Quad Set (Mirrored) - 3 x daily - 7 x weekly - 2 sets - 20 reps ?Supine Short Arc Quad (Mirrored) - 3 x daily - 7 x weekly - 2 sets - 10 reps ?Supine Active Straight Leg Raise - 3 x daily - 7 x weekly - 2 sets - 10  reps ?Supine Hip Abduction - 3 x daily - 7 x weekly - 2 sets - 20 reps ?Seated Knee Flexion AAROM - 3 x daily - 7 x weekly - 2 sets - 10 reps - 5 seconds hold ?Seated Long Arc Quad - 3 x daily - 7 x weekly -

## 2021-06-21 ENCOUNTER — Encounter (HOSPITAL_COMMUNITY): Payer: Self-pay | Admitting: Physical Therapy

## 2021-06-21 ENCOUNTER — Encounter: Payer: Self-pay | Admitting: Orthopaedic Surgery

## 2021-06-21 ENCOUNTER — Ambulatory Visit (HOSPITAL_COMMUNITY): Payer: BC Managed Care – PPO | Admitting: Physical Therapy

## 2021-06-21 DIAGNOSIS — M25661 Stiffness of right knee, not elsewhere classified: Secondary | ICD-10-CM

## 2021-06-21 DIAGNOSIS — G8929 Other chronic pain: Secondary | ICD-10-CM

## 2021-06-21 DIAGNOSIS — R262 Difficulty in walking, not elsewhere classified: Secondary | ICD-10-CM | POA: Diagnosis not present

## 2021-06-21 DIAGNOSIS — M25561 Pain in right knee: Secondary | ICD-10-CM | POA: Diagnosis not present

## 2021-06-21 DIAGNOSIS — Z96651 Presence of right artificial knee joint: Secondary | ICD-10-CM

## 2021-06-21 DIAGNOSIS — M1711 Unilateral primary osteoarthritis, right knee: Secondary | ICD-10-CM | POA: Diagnosis not present

## 2021-06-21 NOTE — Therapy (Signed)
?OUTPATIENT PHYSICAL THERAPY TREATMENT NOTE ? ? ? ? ?Patient Name: Carrie [name] ?MRN: [id number] ?DOB:11/24/1970, 51 y.o., female ?Today's Date: 06/21/2021 ? ?PCP: Jake Samples, PA-C ?REFERRING PROVIDER: Ninfa Linden ? ? PT End of Session - 06/21/21 1308   ? ? Visit Number 27   ? Number of Visits 30  ? Date for PT Re-Evaluation 07/13/21   ? PT Start Time 1315   ? PT Stop Time 1400   ? PT Time Calculation (min) 45 min   ? Activity Tolerance Patient tolerated treatment well;No increased pain   ? ?  ?  ? ?  ? ? ? ?Past Medical History:  ?Diagnosis Date  ? Arthritis   ? Arthrofibrosis of right total knee arthroplasty, subsequent encounter 05/20/2021  ? Diabetes mellitus without complication ([place])   ? Essential hypertension   ? GERD (gastroesophageal reflux disease)   ? Mixed hyperlipidemia   ? PONV (postoperative nausea and vomiting)   ? ?Past Surgical History:  ?Procedure Laterality Date  ? ABDOMINAL HYSTERECTOMY    ? HARDWARE REMOVAL Right 04/02/2021  ? Procedure: Right Knee HARDWARE REMOVAL;  Surgeon: Mcarthur Rossetti, MD;  Location: [facility];  Service: Orthopedics;  Laterality: Right;  ? KNEE ARTHROSCOPY  2009  ? KNEE CLOSED REDUCTION Right 05/20/2021  ? Procedure: CLOSED MANIPULATION RIGHT KNEE;  Surgeon: Mcarthur Rossetti, MD;  Location: [place];  Service: Orthopedics;  Laterality: Right;  ? MASS EXCISION  02/11/2011  ? Procedure: EXCISION MASS;  Surgeon: Jessy Oto, MD;  Location: [place];  Service: Orthopedics;  Laterality: Right;  resection of neuroma right infrapatellar medial knee  ? PATELLA ARTHROPLASTY    ? rt knee-  ? TOTAL KNEE ARTHROPLASTY  2011  ? partial-[place] reg   ? TOTAL KNEE ARTHROPLASTY Right 04/02/2021  ? Procedure: Right TOTAL KNEE ARTHROPLASTY;  Surgeon: Mcarthur Rossetti, MD;  Location: [facility];  Service: Orthopedics;  Laterality: Right;  ? TUBAL LIGATION    ? ?Patient Active Problem List  ? Diagnosis Date Noted  ? Arthrofibrosis of right  total knee arthroplasty, subsequent encounter 05/20/2021  ? Pain in right knee 04/19/2021  ? Stiffness of right knee, not elsewhere classified 04/19/2021  ? Difficulty in walking, not elsewhere classified 04/19/2021  ? Status post total knee replacement, right 04/02/2021  ? Allergic rhinitis due to pollen 06/22/2020  ? Eosinophilic esophagitis 79/89/2119  ? Rash and other nonspecific skin eruption 06/22/2020  ? Unilateral primary osteoarthritis, right knee 10/09/2017  ? AC joint arthropathy 01/04/2017  ? Acute pain of right shoulder 11/15/2016  ? Other derangements of patella, unspecified knee 09/02/2015  ? Recurrent dislocation of right patella 09/02/2015  ? Breast hypertrophy in female 05/13/2015  ? Neuroma of lower extremity 02/11/2011  ?  Class: Chronic  ? ? ?REFERRING DIAG: Right TKA  04/02/21 surgery date, manipulation 05/20/21 ? ?THERAPY DIAG:  ?Chronic pain of right knee ? ?Stiffness of right knee, not elsewhere classified ? ?Difficulty in walking, not elsewhere classified ? ?Status post total knee replacement, right ? ?PERTINENT HISTORY: Prior right partial replacement 12+ years ago ? ?PRECAUTIONS: none ? ?SUBJECTIVE:  PT states that she feels stiff today. ? ?PAIN:  ?Are you having pain? No, just soreness and tightness ? ? ?OBJECTIVE:  ? ?TODAY'S TREATMENT:  AROM 4-108 ?06/21/2021 ?Bike x 5' seat at 4  ?Leg press 3 PL one hole showing x 15  ?Standing : ?Knee flexion x 10 3#  ?Knee driver on 12"  x 3  for 30" ?Terminal extension x 10 ?Rocker board x 2' ?Slant board stretch 30" x 3  ?Single leg stance  3x  ?Prone: ?Quad stretch 30" x 3 ?Knee flexion x 10 ?Contract relax x 3  ?Hip extension RT x 10 m ?Supine: ?Hamstring stretch with pulling into flexion x 3 ?Quad set: x 10 ?Heelslide :  hold for 3 deep breaths then pull back further x 5  ?Bridge x 10 making sure Rt hip stays even with Lt ? ?06/18/2021 ?Bike x 5' seat at 4  ?Leg press 3 PL one hole showing x 10  ?Standing : ?Knee flexion x 10 ?Knee driver on 12"  x 3  for 30" ?Terminal extension x 10 ?Functional squat picking ball of 4" step x 5 ?Rocker board x 2' ?Prone: ?Quad stretch 30" x 3 ?Knee flexion x 10  ?Contract relax x 3  ?Supine: ?Quad set: x 10 ?Heelslide :  hold for 3 deep breaths then pull back further x 5  ?Bridge x 10 making sure Rt hip stays even with Lt ? ?06/16/21 ?R knee supine AROM: lacking 2 to 104, AAROM: 107  ?Bike x 5 minutes seat 4 ?Standing 12" Rt knee flexion stretch 10X10" ?Standing hamstring curl 3# 3x 10 ?Squat with bilateral UE support 3x10  ?Lateral step down 4" 3x 10 eccentric control ?Forward step down 4" 2X10 eccentric control with 1 HHA ?STS 2x 10 on 18 inch box Rt closer back to body ?Prone: contract relax for flexion 3X ?Supine heelslides prior to [facility] measurement ?Manual: scar massage to lateral knee and along scar line ? ?06/15/21:  ?R knee AROM: lacking 2 to 101, AAROM: 104 with heel slides; R knee ext MMT 4+/5 ?Bike x 5 minutes level 5  ?Heel slides 10 x 10 second holds in supine RLE ?Lateral step down 4 inch step 3x 10 eccentric control ?Squat with bilateral UE support 3x10 ?STS 2x 10 on 18 inch box ?Standing hamstring curl 3# 3x 10  ? ? ?06/11/21 ?Bike x 10 min level 5 ?Knee drives on 12"  box x 2' ?Knee flexion negative on step x 2' ? ?STW and manual Right knee flexion and extension x 15 min in supine and sitting; some contract relax in sitting x 5 reps ; all manual interventions performed independently of other interventions;  ? ?Bike another 5 min on level 4; noted increased ease of revolution ? ?AROM R knee flexion in sitting 102, AAROM 110 ? ? ?06/10/21: ?Right knee AROM flexion not formally tested today ?           Knee extension in lying 0 supine AAROM ? ?Bike x 15 mins seat Each 5 mins down a seat level from 5 to 4 to 3  ?Box knee drives for knee flexion at bottom step x 2"   ?Trial of new stair stretch - posterior at stairs, seated at 2nd step and then dip down 6 reps x 10 second hold  ? ?STW and manual Right knee flexion and  extension x 15 min in supine and sitting ; all manual interventions performed independently of other interventions ? - manual STW at gross right knee especially distal lateral quad with grade IV patellar mobilizations all directions ? - manual STW working into flexion with continued patellar and distal quad focus with grade III tibial posterior glide mobilizations x 20 reps ? - manual STW in sitting with knee flexion hangs, followed by PROM knee flexion in sitting x 4 reps with cueing for relaxation ? ?  06/08/21: ?Right knee AROM flexion not formally tested today ?           Knee extension in lying 0 supine AAROM ? ?Bike x 15 mins seat Each 5 mins down a seat level from 5 to 4 to 3  ?Box knee drives for knee flexion at bottom step x 2"   ?Trial of new stair stretch - posterior at stairs, seated at 2nd step and then dip down 6 reps x 10 second hold  ? ?STW and manual Right knee flexion and extension x 15 min in supine and sitting ; all manual interventions performed independently of other interventions ? - manual STW at gross right knee especially distal lateral quad with grade IV patellar mobilizations all directions ? - manual STW working into flexion with continued patellar and distal quad focus with grade III tibial posterior glide mobilizations x 20 reps ? - manual STW in sitting with knee flexion hangs, followed by PROM knee flexion in sitting x 4 reps with cueing for relaxation ? ? ? ? ? ?PATIENT EDUCATION: ?Education details: ice for swelling, pain management  06/10/21: education on repeat treatment to test post soreness acceptance 5/2 HEP ?Person educated: Patient ?Education method: Explanation  ?Education comprehension: verbalized understanding ? ?HOME EXERCISE PROGRAM: ?Exercises ?- Supine Heel Slide with Strap  - 4-5 x daily - 7 x weekly - 2 sets - 10 reps - 10 second hold ?- Supine Knee Extension Mobilization with Weight  - 4-5 x daily - 7 x weekly - 1 sets - 1 reps - 3-5 minutes holdAccess Code:  [email or web address] ?Exercises ?Prone Knee Extension Hang - 3 x daily - 7 x weekly - 1 sets - 1 reps - 1-3 min hold ? ?Exercises ?Supine Heel Slides - 3-5 x daily - 7 x weekly - 2 sets - 20 reps ?Supine Single Leg Ankle Pumps

## 2021-06-22 ENCOUNTER — Encounter: Payer: Self-pay | Admitting: Orthopaedic Surgery

## 2021-06-23 ENCOUNTER — Encounter (HOSPITAL_COMMUNITY): Payer: BC Managed Care – PPO | Admitting: Physical Therapy

## 2021-06-25 ENCOUNTER — Encounter (HOSPITAL_COMMUNITY): Payer: BC Managed Care – PPO | Admitting: Physical Therapy

## 2021-06-28 ENCOUNTER — Ambulatory Visit (HOSPITAL_COMMUNITY): Payer: BC Managed Care – PPO

## 2021-06-28 DIAGNOSIS — Z96651 Presence of right artificial knee joint: Secondary | ICD-10-CM | POA: Diagnosis not present

## 2021-06-28 DIAGNOSIS — M25661 Stiffness of right knee, not elsewhere classified: Secondary | ICD-10-CM

## 2021-06-28 DIAGNOSIS — G8929 Other chronic pain: Secondary | ICD-10-CM

## 2021-06-28 DIAGNOSIS — M25561 Pain in right knee: Secondary | ICD-10-CM | POA: Diagnosis not present

## 2021-06-28 DIAGNOSIS — M1711 Unilateral primary osteoarthritis, right knee: Secondary | ICD-10-CM

## 2021-06-28 DIAGNOSIS — R262 Difficulty in walking, not elsewhere classified: Secondary | ICD-10-CM

## 2021-06-28 NOTE — Therapy (Signed)
OUTPATIENT PHYSICAL THERAPY TREATMENT NOTE     Patient Name: Carrie [name] MRN: [id number] DOB:1970-04-14, 51 y.o., female Today's Date: 06/28/2021  PCP: Avis Epley, PA-C REFERRING PROVIDER: Magnus Ivan  END OF SESSION:   PT End of Session - 06/28/21 0817     Visit Number 28    Number of Visits 36    Date for PT Re-Evaluation 07/13/21    Authorization Type [employer] no auth needed    PT Start Time 0815    PT Stop Time 0859    PT Time Calculation (min) 44 min    Activity Tolerance Patient tolerated treatment well;No increased pain    Behavior During Therapy [facility] for tasks assessed/performed              Past Medical History:  Diagnosis Date   Arthritis    Arthrofibrosis of right total knee arthroplasty, subsequent encounter 05/20/2021   Diabetes mellitus without complication ([facility])    Essential hypertension    GERD (gastroesophageal reflux disease)    Mixed hyperlipidemia    PONV (postoperative nausea and vomiting)    Past Surgical History:  Procedure Laterality Date   ABDOMINAL HYSTERECTOMY     HARDWARE REMOVAL Right 04/02/2021   Procedure: Right Knee HARDWARE REMOVAL;  Surgeon: Kathryne Hitch, MD;  Location: [facility];  Service: Orthopedics;  Laterality: Right;   KNEE ARTHROSCOPY  2009   KNEE CLOSED REDUCTION Right 05/20/2021   Procedure: CLOSED MANIPULATION RIGHT KNEE;  Surgeon: Kathryne Hitch, MD;  Location: [facility];  Service: Orthopedics;  Laterality: Right;   MASS EXCISION  02/11/2011   Procedure: EXCISION MASS;  Surgeon: Kerrin Champagne, MD;  Location: [facility];  Service: Orthopedics;  Laterality: Right;  resection of neuroma right infrapatellar medial knee   PATELLA ARTHROPLASTY     rt knee-   TOTAL KNEE ARTHROPLASTY  2011   partial-[place] reg    TOTAL KNEE ARTHROPLASTY Right 04/02/2021   Procedure: Right TOTAL KNEE ARTHROPLASTY;  Surgeon: Kathryne Hitch, MD;  Location: [facility];  Service: Orthopedics;   Laterality: Right;   TUBAL LIGATION     Patient Active Problem List   Diagnosis Date Noted   Arthrofibrosis of right total knee arthroplasty, subsequent encounter 05/20/2021   Pain in right knee 04/19/2021   Stiffness of right knee, not elsewhere classified 04/19/2021   Difficulty in walking, not elsewhere classified 04/19/2021   Status post total knee replacement, right 04/02/2021   Allergic rhinitis due to pollen 06/22/2020   Eosinophilic esophagitis 06/22/2020   Rash and other nonspecific skin eruption 06/22/2020   Unilateral primary osteoarthritis, right knee 10/09/2017   AC joint arthropathy 01/04/2017   Acute pain of right shoulder 11/15/2016   Other derangements of patella, unspecified knee 09/02/2015   Recurrent dislocation of right patella 09/02/2015   Breast hypertrophy in female 05/13/2015   Neuroma of lower extremity 02/11/2011    Class: Chronic    REFERRING DIAG: Right TKA  04/02/21 surgery date, manipulation 05/20/21  THERAPY DIAG:  Chronic pain of right knee  Status post total knee replacement, right  Stiffness of right knee, not elsewhere classified  Unilateral primary osteoarthritis, right knee  Difficulty in walking, not elsewhere classified  PERTINENT HISTORY: Prior right partial replacement 12+ years ago  PRECAUTIONS: none  SUBJECTIVE:  Patient reports a dog jumped on her over the weekend and "tweaked" both knees.  She is not hurting per say but "sore".   PAIN:  Are you having pain? No,  just soreness and tightness   OBJECTIVE:   TODAY'S TREATMENT:  AROM 4-108 06/28/21 Bike x 5' seat at 4  Leg press 3 PL one hole showing 2 x 15  Cybex Hamstring curl 2 x 15 4.5 plates 2 x 15  Sit to stand to heel raise with no upper extremity assist 2 x 10 Slant board 5 x 30" 12" box deep knee flexion 10" x 2' Tandem stance on foam x 1' each way TKE 3 plates 2 x 1' Step stretch for knee flexion x 2'  Seated manual knee flexion contract relax x 5  AROM  seated knee flexion today 105        06/21/2021 Bike x 5' seat at 4  Leg press 3 PL one hole showing x 15  Standing : Knee flexion x 10 3#  Knee driver on 12"  x 3 for 30" Terminal extension x 10 Rocker board x 2' Slant board stretch 30" x 3  Single leg stance  3x  Prone: Quad stretch 30" x 3 Knee flexion x 10 Contract relax x 3  Hip extension RT x 10 m Supine: Hamstring stretch with pulling into flexion x 3 Quad set: x 10 Heelslide :  hold for 3 deep breaths then pull back further x 5  Bridge x 10 making sure Rt hip stays even with Lt  06/18/2021 Bike x 5' seat at 4  Leg press 3 PL one hole showing x 10  Standing : Knee flexion x 10 Knee driver on 12"  x 3 for 30" Terminal extension x 10 Functional squat picking ball of 4" step x 5 Rocker board x 2' Prone: Quad stretch 30" x 3 Knee flexion x 10  Contract relax x 3  Supine: Quad set: x 10 Heelslide :  hold for 3 deep breaths then pull back further x 5  Bridge x 10 making sure Rt hip stays even with Lt   PATIENT EDUCATION: Education details: ice for swelling, pain management  06/10/21: education on repeat treatment to test post soreness acceptance 5/2 HEP Person educated: Patient Education method: Explanation  Education comprehension: verbalized understanding  HOME EXERCISE PROGRAM: Exercises - Supine Heel Slide with Strap  - 4-5 x daily - 7 x weekly - 2 sets - 10 reps - 10 second hold - Supine Knee Extension Mobilization with Weight  - 4-5 x daily - 7 x weekly - 1 sets - 1 reps - 3-5 minutes holdAccess Code: [email or web address] Exercises Prone Knee Extension Hang - 3 x daily - 7 x weekly - 1 sets - 1 reps - 1-3 min hold  Exercises Supine Heel Slides - 3-5 x daily - 7 x weekly - 2 sets - 20 reps Supine Single Leg Ankle Pumps - 3-5 x daily - 7 x weekly - 2 sets - 20 reps Supine Quad Set (Mirrored) - 3 x daily - 7 x weekly - 2 sets - 20 reps Supine Short Arc Quad (Mirrored) - 3 x daily - 7 x weekly - 2 sets - 10  reps Supine Active Straight Leg Raise - 3 x daily - 7 x weekly - 2 sets - 10 reps Supine Hip Abduction - 3 x daily - 7 x weekly - 2 sets - 20 reps Seated Knee Flexion AAROM - 3 x daily - 7 x weekly - 2 sets - 10 reps - 5 seconds hold Seated Long Arc Quad - 3 x daily - 7 x weekly - 2 sets - 10 reps  Exercises Standing Gastroc Stretch on Step - 2 x daily - 7 x weekly - 5 sets - 1 reps - 20 hold Standing Knee Flexion Stretch on Step - 2 x daily - 7 x weekly - 2 sets - 10 reps Standing Hamstring Stretch with Step - 2 x daily - 7 x weekly - 1 sets - 5 reps - 20 hold  Exercises - Sit to Stand with Arms Crossed  - 1 x daily - 7 x weekly - 3 sets - 10 reps - Lateral Step Down  - 1 x daily - 7 x weekly - 3 sets - 10 reps   PT Short Term Goals -       PT SHORT TERM GOAL #1   Title Patient will be able to improve right knee AROM to 0 degrees extension to improve gait mechanics and standing safety.    Baseline 05/12/21:0 deg extension AAROM    Time 1    Period Months    Status ongoing   Target Date 05/20/21      PT SHORT TERM GOAL #2   Title Patient will be able to improve right knee flexion AROM to 80 degrees to improve function.    Baseline 04/26/21 AAROM R knee 92 degrees in sitting today.    Time 1    Period Months    Status Met 05/10/21   Target Date 05/20/21      PT SHORT TERM GOAL #3   Title Patient will be able to ambulate without use of front wheel walker throughout home for improved ADLs.    Baseline 04/19/21: Patient currently using front wheel walker consistently    Time 1    Period Months    Status Met 05/12/21   Target Date 05/20/21              PT Long Term Goals       PT LONG TERM GOAL #1   Title Patient will be able to get to 0 degrees extension and at least 110 degrees flexion for right knee AROM for proper mechanics.    Baseline 05/26/21: 0 deg AAROM extension and 100 degrees flexion end of session AAROM  06/03/21 AROM Right knee 100 degrees in sitting   Time 2     Period Months    Status Ongoing   Target Date 06/19/21      PT LONG TERM GOAL #2   Title Patient will be able to ambulate in community over 30 minutes without assistive devices and with good form.    Baseline 04/19/21: using front wheeled walker at home and community    Time 2    Period Months    Status Met   Target Date 06/19/21      PT LONG TERM GOAL #3   Title Patient will improve FOTO score to at least 69 degrees to show improved functional abilities    Baseline 04/19/21: 55 today    Time 2    Period Months    Status ongoing   Target Date 06/19/21              Plan -     Clinical Impression Statement Today's session continued to focus on knee strengthening and mobility and balance.  Added tandem stance on foam. Patient starting to plateau with knee flexion gains; left knee is now giving her issues with pain and giving way. Patient will benefit from continued skilled therapy interventions to address deficits and improve functional mobility.  Personal Factors and Comorbidities Past/Current Experience;Time since onset of injury/illness/exacerbation    Examination-Activity Limitations Locomotion Level;Carry;Dressing;Hygiene/Grooming;Lift;Stand;Squat;Stairs    Examination-Participation Restrictions Cleaning;Community Activity;Laundry;Occupation;Yard Work    [employer]    PT Frequency 3x / week    PT Duration 4 weeks    PT Treatment/Interventions ADLs/Self Care Home Management;Biofeedback;Gait training;Stair training;Functional mobility training;Therapeutic activities;Therapeutic exercise;Balance training;Neuromuscular re-education;Patient/family education;Manual techniques;Manual lymph drainage;Scar mobilization;Passive range of motion;Energy conservation;Taping;Vasopneumatic Device;Joint Manipulations    PT Next Visit Plan  Continue to focus on Right knee flexion primarily for now, build more aggressive deep weight bearing flexion and increased OP manual as needed    Consulted and Agree with Plan of Care Patient             9:04 AM, 06/28/21 Genean Adamski Small Dail Lerew MPT [place] [facility] [place] [phone] Ph:[phone]

## 2021-06-30 ENCOUNTER — Encounter (HOSPITAL_COMMUNITY): Payer: BC Managed Care – PPO | Admitting: Physical Therapy

## 2021-07-01 ENCOUNTER — Ambulatory Visit (INDEPENDENT_AMBULATORY_CARE_PROVIDER_SITE_OTHER): Payer: BC Managed Care – PPO

## 2021-07-01 ENCOUNTER — Ambulatory Visit (INDEPENDENT_AMBULATORY_CARE_PROVIDER_SITE_OTHER): Payer: BC Managed Care – PPO | Admitting: Orthopaedic Surgery

## 2021-07-01 ENCOUNTER — Encounter: Payer: Self-pay | Admitting: Orthopaedic Surgery

## 2021-07-01 DIAGNOSIS — Z96651 Presence of right artificial knee joint: Secondary | ICD-10-CM

## 2021-07-01 NOTE — Progress Notes (Signed)
The patient is returning for continued follow-up as it relates to her right total knee arthroplasty.  Postoperatively she did require a manipulation under anesthesia and now reports much better range of motion of her right knee.  We have been seeing her for some time for her left knee.  We have tried conservative treatment including activity modification.  With recent physical therapy she was working on quad strengthening her left knee.  She has tried anti-inflammatories and steroid injections left knee.  She feels like it is becoming unstable to her and does not support her weight well.  She reports locking catching with left knee and that conservative treatment does not help.  She does feel like she is making excellent progress with her right total knee at this point.  Her right total knee arthroplasty has much improved range of motion with flexion and extension.  I can flex her past 100 degrees today and her extension is getting close to full.  The left knee still shows a positive Murray sign the medial compartment the knee with a mild effusion.  It feels ligamentously stable otherwise.  Previous x-rays of the left knee show well-maintained joint space.  At this point a MRI of the left knee is warranted to rule out a meniscal tear and to assess the cartilage.  She will continue range of motion and aggressive therapy on both knees.  We will see her back after the MRI of her left knee.  All question concerns were answered addressed.  She can return to work as well.

## 2021-07-02 ENCOUNTER — Other Ambulatory Visit: Payer: Self-pay

## 2021-07-02 ENCOUNTER — Encounter (HOSPITAL_COMMUNITY): Payer: BC Managed Care – PPO

## 2021-07-02 DIAGNOSIS — M25562 Pain in left knee: Secondary | ICD-10-CM

## 2021-07-05 ENCOUNTER — Ambulatory Visit (HOSPITAL_COMMUNITY): Payer: BC Managed Care – PPO | Admitting: Physical Therapy

## 2021-07-07 ENCOUNTER — Encounter (HOSPITAL_COMMUNITY): Payer: BC Managed Care – PPO | Admitting: Physical Therapy

## 2021-07-09 ENCOUNTER — Ambulatory Visit
Admission: RE | Admit: 2021-07-09 | Discharge: 2021-07-09 | Disposition: A | Discharge status: Home / Self Care | Payer: BC Managed Care – PPO | Source: Ambulatory Visit | Attending: Orthopaedic Surgery | Admitting: Orthopaedic Surgery

## 2021-07-09 ENCOUNTER — Encounter (HOSPITAL_COMMUNITY): Payer: BC Managed Care – PPO

## 2021-07-09 DIAGNOSIS — M25562 Pain in left knee: Secondary | ICD-10-CM | POA: Diagnosis not present

## 2021-07-12 ENCOUNTER — Encounter (HOSPITAL_COMMUNITY): Payer: BC Managed Care – PPO | Admitting: Physical Therapy

## 2021-07-14 ENCOUNTER — Encounter (HOSPITAL_COMMUNITY): Payer: BC Managed Care – PPO | Admitting: Physical Therapy

## 2021-07-15 DIAGNOSIS — E119 Type 2 diabetes mellitus without complications: Secondary | ICD-10-CM | POA: Diagnosis not present

## 2021-07-21 ENCOUNTER — Ambulatory Visit (INDEPENDENT_AMBULATORY_CARE_PROVIDER_SITE_OTHER): Payer: BC Managed Care – PPO | Admitting: Orthopaedic Surgery

## 2021-07-21 ENCOUNTER — Encounter: Payer: Self-pay | Admitting: Orthopaedic Surgery

## 2021-07-21 DIAGNOSIS — S83232D Complex tear of medial meniscus, current injury, left knee, subsequent encounter: Secondary | ICD-10-CM | POA: Diagnosis not present

## 2021-07-21 NOTE — Progress Notes (Signed)
The patient is a 51 year old well-known to me.  4 months ago we replaced her right knee.  She had been having significant left knee pain with swelling.  She had tried and failed conservative treatment left knee so I sent her for an MRI.  She is here for review of that today.  She is having swelling in her feet and ankles on both sides.  Image her left knee shows significant medial joint line tenderness and a positive McMurray's sign to the medial compartment of the knee.  The MRI of her left knee does show significant synovitis and a large effusion.  There is a complex tear of the medial meniscus of her knee but some areas of full-thickness cartilage loss in the medial compartment.  She is not interested in knee replacement surgery given the struggle she has had with her right knee.  I agree with her trying an arthroscopic intervention for the left knee given the medial meniscal tear combined with synovitis.  I would then follow this later with hyaluronic acid for the left knee.  She agrees with this treatment plan.  We will work on getting her scheduled for a left knee arthroscopy with a partial medial meniscectomy and then see her back in 1 week postoperative.  All questions and concerns were answered and addressed.

## 2021-07-23 ENCOUNTER — Encounter: Payer: Self-pay | Admitting: Orthopaedic Surgery

## 2021-08-06 DIAGNOSIS — E7849 Other hyperlipidemia: Secondary | ICD-10-CM | POA: Diagnosis not present

## 2021-08-06 DIAGNOSIS — Z6829 Body mass index (BMI) 29.0-29.9, adult: Secondary | ICD-10-CM | POA: Diagnosis not present

## 2021-08-06 DIAGNOSIS — Z96659 Presence of unspecified artificial knee joint: Secondary | ICD-10-CM | POA: Diagnosis not present

## 2021-08-06 DIAGNOSIS — R600 Localized edema: Secondary | ICD-10-CM | POA: Diagnosis not present

## 2021-08-06 DIAGNOSIS — E782 Mixed hyperlipidemia: Secondary | ICD-10-CM | POA: Diagnosis not present

## 2021-08-06 DIAGNOSIS — Z780 Asymptomatic menopausal state: Secondary | ICD-10-CM | POA: Diagnosis not present

## 2021-08-06 DIAGNOSIS — E663 Overweight: Secondary | ICD-10-CM | POA: Diagnosis not present

## 2021-08-14 DIAGNOSIS — E119 Type 2 diabetes mellitus without complications: Secondary | ICD-10-CM | POA: Diagnosis not present

## 2021-09-14 DIAGNOSIS — E119 Type 2 diabetes mellitus without complications: Secondary | ICD-10-CM | POA: Diagnosis not present

## 2021-09-15 ENCOUNTER — Ambulatory Visit: Payer: BC Managed Care – PPO | Admitting: Podiatry

## 2021-09-15 DIAGNOSIS — R946 Abnormal results of thyroid function studies: Secondary | ICD-10-CM | POA: Diagnosis not present

## 2021-09-16 ENCOUNTER — Other Ambulatory Visit: Payer: Self-pay | Admitting: Orthopaedic Surgery

## 2021-09-16 DIAGNOSIS — M94262 Chondromalacia, left knee: Secondary | ICD-10-CM | POA: Diagnosis not present

## 2021-09-16 DIAGNOSIS — Z9889 Other specified postprocedural states: Secondary | ICD-10-CM

## 2021-09-16 DIAGNOSIS — S83232D Complex tear of medial meniscus, current injury, left knee, subsequent encounter: Secondary | ICD-10-CM | POA: Diagnosis not present

## 2021-09-16 DIAGNOSIS — M948X6 Other specified disorders of cartilage, lower leg: Secondary | ICD-10-CM | POA: Diagnosis not present

## 2021-09-16 DIAGNOSIS — M659 Synovitis and tenosynovitis, unspecified: Secondary | ICD-10-CM | POA: Diagnosis not present

## 2021-09-16 DIAGNOSIS — G8918 Other acute postprocedural pain: Secondary | ICD-10-CM | POA: Diagnosis not present

## 2021-09-16 MED ORDER — HYDROCODONE-ACETAMINOPHEN 7.5-325 MG PO TABS: 1.0000 | ORAL_TABLET | Freq: Four times a day (QID) | ORAL | 0 refills | Status: DC | PRN

## 2021-09-23 ENCOUNTER — Encounter: Payer: Self-pay | Admitting: Physician Assistant

## 2021-09-23 ENCOUNTER — Ambulatory Visit (INDEPENDENT_AMBULATORY_CARE_PROVIDER_SITE_OTHER): Payer: BC Managed Care – PPO | Admitting: Physician Assistant

## 2021-09-23 DIAGNOSIS — Z9889 Other specified postprocedural states: Secondary | ICD-10-CM

## 2021-09-23 NOTE — Progress Notes (Signed)
HPI: Carrie [name] is now 1 week status post left knee arthroscopy.  She states knee feels better.  She was found to have grade 3 to grade IV chondromalacia weightbearing surface of the medial femoral condyle and medial tibial plateau.  Grade 4 changes lateral femoral condyle and grade III chondromalacia in involving the patellofemoral joint.  Denies any fevers chills.  No calf pain shortness of breath.  Physical exam: Left knee port sites are all healing well no signs of infection.  Calf supple nontender.  Full extension knee flexion to approximately 110 degrees.  Impression: Status post left knee arthroscopy Tricompartmental arthritis left knee  Plan: She will work on range of motion strengthening the knee.  Scar tissue mobilization is encouraged.  Sutures removed from port sites.  She will follow-up with [place] in 3 weeks to see how she is doing overall.  Questions were encouraged and answered at length.  She may benefit from supplemental injections in the future.

## 2021-10-04 ENCOUNTER — Ambulatory Visit: Payer: BC Managed Care – PPO | Admitting: Podiatry

## 2021-10-04 ENCOUNTER — Encounter: Payer: Self-pay | Admitting: Podiatry

## 2021-10-04 DIAGNOSIS — L600 Ingrowing nail: Secondary | ICD-10-CM

## 2021-10-04 DIAGNOSIS — L03032 Cellulitis of left toe: Secondary | ICD-10-CM

## 2021-10-04 MED ORDER — NEOMYCIN-POLYMYXIN-HC 3.5-10000-1 OT SUSP
OTIC | 0 refills | Status: DC
Start: 2021-10-04 — End: 2022-04-04

## 2021-10-04 MED ORDER — CEPHALEXIN 500 MG PO CAPS: 500.0000 mg | ORAL_CAPSULE | Freq: Three times a day (TID) | ORAL | 0 refills | Status: AC

## 2021-10-04 NOTE — Progress Notes (Signed)
  Subjective:  Patient ID: Carrie [name], female    DOB: 1970/06/05,  MRN: [id number]  Chief Complaint  Patient presents with   Nail Problem    "I had an ingrown toenail two weeks ago.  I thought I got it out.  It started looking like this yesterday."  N - ingrown toenail L - hallux lt D - 2 weeks O - gradually worsened C - pus, tender, throb A - shoes and pressure T - I tried to get the toenail out     51 y.o. female presents with the above complaint. History confirmed with patient.   Objective:  Physical Exam: warm, good capillary refill, no trophic changes or ulcerative lesions, normal DP and PT pulses, and normal sensory exam.  Ingrown left hallux lateral border.  Small blister at tip of toe with purulent drainage  Assessment:   1. Ingrowing left great toenail   2. Paronychia of great toe of left foot      Plan:  Patient was evaluated and treated and all questions answered.    Ingrown Nail, left -Patient elects to proceed with minor surgery to remove ingrown toenail today. Consent reviewed and signed by patient. -Ingrown nail excised. See procedure note. -Educated on post-procedure care including soaking. Written instructions provided and reviewed. -Patient to follow up in 2 weeks for nail check.  Procedure: Excision of Ingrown Toenail Location: Left 1st toe lateral nail borders. Anesthesia: Lidocaine 1% plain; 1.5 mL and Marcaine 0.5% plain; 1.5 mL, digital block. Skin Prep: Betadine. Dressing: Silvadene; telfa; dry, sterile, compression dressing. Technique: Following skin prep, the toe was exsanguinated and a tourniquet was secured at the base of the toe. The affected nail border was freed, split with a nail splitter, and excised. Chemical matrixectomy was then performed with phenol and irrigated out with alcohol. The tourniquet was then removed and sterile dressing applied. Disposition: Patient tolerated procedure well. Patient to return in 2 weeks for  follow-up.    For the paronychia itself I drained the purulence and blister and placed her on 5 days of Keflex, we discussed its use and possible side effects.  Expect this to resolve uneventfully with soaking and removal of the ingrown nail above  Return in about 16 days (around 10/20/2021) for nail re-check.

## 2021-10-04 NOTE — Patient Instructions (Signed)

## 2021-10-14 ENCOUNTER — Ambulatory Visit (INDEPENDENT_AMBULATORY_CARE_PROVIDER_SITE_OTHER): Payer: BC Managed Care – PPO | Admitting: Physician Assistant

## 2021-10-14 ENCOUNTER — Encounter: Payer: Self-pay | Admitting: Physician Assistant

## 2021-10-14 DIAGNOSIS — M1712 Unilateral primary osteoarthritis, left knee: Secondary | ICD-10-CM

## 2021-10-14 DIAGNOSIS — Z9889 Other specified postprocedural states: Secondary | ICD-10-CM

## 2021-10-14 NOTE — Progress Notes (Signed)
HPI: [name] comes in today status post left knee arthroscopy 09/16/2021.  She is found to have grade 3 to grade 4 changes of weightbearing surface of the medial femoral condyle medial tibial plateau and grade 4 changes lateral femoral condyle and grade 3 changes involving the patellofemoral joint.  She states that the knee overall is somewhat better but still having some pain.  Still locks at times.  She is taking no medications for the pain.  Physical exam: Left knee port sites well-healed no signs of infection.  Full extension full flexion.  Calf supple nontender.  Impression: Status post left knee arthroscopy Tricompartmental arthritis left knee  Plan: At this point time recommend she continue work on strengthening.  She again will think about having supplemental injection let us know.  Otherwise follow-up as needed.  Questions encouraged and answered at length.

## 2021-10-15 DIAGNOSIS — E119 Type 2 diabetes mellitus without complications: Secondary | ICD-10-CM | POA: Diagnosis not present

## 2021-10-20 ENCOUNTER — Ambulatory Visit: Payer: BC Managed Care – PPO | Admitting: Podiatry

## 2021-10-20 DIAGNOSIS — L600 Ingrowing nail: Secondary | ICD-10-CM

## 2021-10-20 NOTE — Progress Notes (Signed)
  Subjective:  Patient ID: Carrie [name], female    DOB: Jul 15, 1970,  MRN: [id number]  Chief Complaint  Patient presents with   Ingrown Toenail    Left nail check, feels much better    51 y.o. female presents with the above complaint. History confirmed with patient.   Objective:  Physical Exam: warm, good capillary refill, no trophic changes or ulcerative lesions, normal DP and PT pulses, and normal sensory exam.  Matricectomy site healing well  Assessment:   1. Ingrowing left great toenail      Plan:  Patient was evaluated and treated and all questions answered.    Ingrown Nail, left -Doing much better has completely resolved still has some minor erythema and scab.  Advised to leave open to air and allow to continue to grow out and heal expect this to resolve uneventfully the next few weeks  Return if symptoms worsen or fail to improve.

## 2021-11-08 NOTE — Telephone Encounter (Signed)
Please get auth thank you

## 2021-11-09 NOTE — Telephone Encounter (Signed)
VOB has been submitted for SynviscOne, left knee.

## 2021-11-10 ENCOUNTER — Telehealth: Payer: Self-pay

## 2021-11-10 ENCOUNTER — Telehealth: Payer: Self-pay | Admitting: Podiatry

## 2021-11-10 MED ORDER — CEPHALEXIN 500 MG PO CAPS: 500.0000 mg | ORAL_CAPSULE | Freq: Three times a day (TID) | ORAL | 0 refills | Status: DC

## 2021-11-10 NOTE — Telephone Encounter (Signed)
PA was submitted online through covermymeds. PA pending# [id number]

## 2021-11-10 NOTE — Telephone Encounter (Signed)
Pt has L Great Toe infection with white fluid coming out. Pt is a Diabetic, She has an appointment on 10/4 and added to wait list. Pt wants to know can she have an antibiotic to hold her over till she sees Dr Sherryle Lis  Please Advise

## 2021-11-11 NOTE — Telephone Encounter (Signed)
Patient is aware 

## 2021-11-12 ENCOUNTER — Other Ambulatory Visit: Payer: Self-pay

## 2021-11-12 DIAGNOSIS — M1712 Unilateral primary osteoarthritis, left knee: Secondary | ICD-10-CM

## 2021-11-14 DIAGNOSIS — E119 Type 2 diabetes mellitus without complications: Secondary | ICD-10-CM | POA: Diagnosis not present

## 2021-11-15 ENCOUNTER — Encounter: Payer: Self-pay | Admitting: Orthopaedic Surgery

## 2021-11-15 ENCOUNTER — Ambulatory Visit: Payer: BC Managed Care – PPO | Admitting: Orthopaedic Surgery

## 2021-11-15 DIAGNOSIS — M1712 Unilateral primary osteoarthritis, left knee: Secondary | ICD-10-CM

## 2021-11-15 MED ORDER — HYLAN G-F 20 48 MG/6ML IX SOSY
48.0000 mg | PREFILLED_SYRINGE | INTRA_ARTICULAR | Status: AC | PRN
  Administered 2021-11-15: 48 mg via INTRA_ARTICULAR

## 2021-11-15 NOTE — Progress Notes (Signed)
   Procedure Note  Patient: Carrie [name]             Date of Birth: 03-Oct-1970           MRN: [id number]             Visit Date: 11/15/2021  Procedures: Visit Diagnoses:  1. Primary osteoarthritis of left knee     Large Joint Inj: L knee on 11/15/2021 1:06 PM Indications: pain and diagnostic evaluation Details: 22 G 1.5 in needle, superolateral approach  Arthrogram: No  Medications: 48 mg Hylan 48 MG/6ML Outcome: tolerated well, no immediate complications Procedure, treatment alternatives, risks and benefits explained, specific risks discussed. Consent was given by the patient. Immediately prior to procedure a time out was called to verify the correct patient, procedure, equipment, support staff and site/side marked as required. Patient was prepped and draped in the usual sterile fashion.    The patient is a 51 year old female well-known to me.  We have replaced her right knee.  She is here today for hyaluronic acid in her left knee with Synvisc 1 to treat the pain from well-documented osteoarthritis.  She is doing well otherwise.  Her left knee shows no effusion today but painful range of motion mainly of the medial joint line and patellofemoral joint.  I did place Synvisc 1 in her left knee today without difficulty.  All questions and concerns were answered and addressed.  Follow-up is as needed.  She knows if this does not help or worsens to come see [place].  She says her right total knee is doing fine.   Lot# [id number]

## 2021-11-17 ENCOUNTER — Ambulatory Visit: Payer: BC Managed Care – PPO | Admitting: Podiatry

## 2021-11-17 DIAGNOSIS — L600 Ingrowing nail: Secondary | ICD-10-CM | POA: Diagnosis not present

## 2021-11-17 NOTE — Patient Instructions (Signed)

## 2021-11-21 NOTE — Progress Notes (Signed)
  Subjective:  Patient ID: Carrie [name], female    DOB: 31-Oct-1970,  MRN: [id number]  Chief Complaint  Patient presents with   Ingrown Toenail       L foot has a infected Great toe ingrown    51 y.o. female presents with the above complaint. History confirmed with patient.  She has had recurrence of the ingrowing nail  Objective:  Physical Exam: warm, good capillary refill, no trophic changes or ulcerative lesions, normal DP and PT pulses, and normal sensory exam.  Ingrown left hallux lateral border with mild paronychia  Assessment:   1. Ingrowing left great toenail      Plan:  Patient was evaluated and treated and all questions answered.    Ingrown Nail, left -Patient elects to proceed with minor surgery to remove ingrown toenail today. Consent reviewed and signed by patient. -Ingrown nail excised. See procedure note. -Educated on post-procedure care including soaking. Written instructions provided and reviewed.   Procedure: Excision of Ingrown Toenail Location: Left 1st toe lateral nail borders. Anesthesia: Lidocaine 1% plain; 1.5 mL and Marcaine 0.5% plain; 1.5 mL, digital block. Skin Prep: Betadine. Dressing: Silvadene; telfa; dry, sterile, compression dressing. Technique: Following skin prep, the toe was exsanguinated and a tourniquet was secured at the base of the toe. The affected nail border was freed, split with a nail splitter, and excised. Chemical matrixectomy was then performed with phenol and irrigated out with alcohol. The tourniquet was then removed and sterile dressing applied. Disposition: Patient tolerated procedure well.    Return if symptoms worsen or fail to improve.

## 2021-12-15 DIAGNOSIS — E119 Type 2 diabetes mellitus without complications: Secondary | ICD-10-CM | POA: Diagnosis not present

## 2021-12-24 ENCOUNTER — Other Ambulatory Visit: Payer: Self-pay | Admitting: Family Medicine

## 2021-12-24 DIAGNOSIS — Z1231 Encounter for screening mammogram for malignant neoplasm of breast: Secondary | ICD-10-CM

## 2022-01-05 DIAGNOSIS — Z0001 Encounter for general adult medical examination with abnormal findings: Secondary | ICD-10-CM | POA: Diagnosis not present

## 2022-01-05 DIAGNOSIS — R946 Abnormal results of thyroid function studies: Secondary | ICD-10-CM | POA: Diagnosis not present

## 2022-01-05 DIAGNOSIS — E7849 Other hyperlipidemia: Secondary | ICD-10-CM | POA: Diagnosis not present

## 2022-02-18 DIAGNOSIS — R946 Abnormal results of thyroid function studies: Secondary | ICD-10-CM | POA: Diagnosis not present

## 2022-02-18 DIAGNOSIS — Z1331 Encounter for screening for depression: Secondary | ICD-10-CM | POA: Diagnosis not present

## 2022-02-18 DIAGNOSIS — H9202 Otalgia, left ear: Secondary | ICD-10-CM | POA: Diagnosis not present

## 2022-02-18 DIAGNOSIS — R600 Localized edema: Secondary | ICD-10-CM | POA: Diagnosis not present

## 2022-02-18 DIAGNOSIS — E6609 Other obesity due to excess calories: Secondary | ICD-10-CM | POA: Diagnosis not present

## 2022-02-18 DIAGNOSIS — Z6830 Body mass index (BMI) 30.0-30.9, adult: Secondary | ICD-10-CM | POA: Diagnosis not present

## 2022-02-18 DIAGNOSIS — E782 Mixed hyperlipidemia: Secondary | ICD-10-CM | POA: Diagnosis not present

## 2022-02-18 DIAGNOSIS — Z780 Asymptomatic menopausal state: Secondary | ICD-10-CM | POA: Diagnosis not present

## 2022-02-18 DIAGNOSIS — Z0001 Encounter for general adult medical examination with abnormal findings: Secondary | ICD-10-CM | POA: Diagnosis not present

## 2022-02-23 ENCOUNTER — Encounter: Payer: Self-pay | Admitting: Internal Medicine

## 2022-02-24 ENCOUNTER — Encounter: Payer: Self-pay | Admitting: Orthopaedic Surgery

## 2022-02-28 ENCOUNTER — Encounter: Payer: Self-pay | Admitting: Physician Assistant

## 2022-02-28 ENCOUNTER — Ambulatory Visit: Payer: BC Managed Care – PPO | Admitting: Physician Assistant

## 2022-02-28 DIAGNOSIS — M1712 Unilateral primary osteoarthritis, left knee: Secondary | ICD-10-CM

## 2022-02-28 MED ORDER — LIDOCAINE HCL 1 % IJ SOLN
3.0000 mL | INTRAMUSCULAR | Status: AC | PRN
  Administered 2022-02-28: 3 mL

## 2022-02-28 MED ORDER — METHYLPREDNISOLONE ACETATE 40 MG/ML IJ SUSP
40.0000 mg | INTRAMUSCULAR | Status: AC | PRN
  Administered 2022-02-28: 40 mg via INTRA_ARTICULAR

## 2022-02-28 NOTE — Progress Notes (Signed)
   Procedure Note  Patient: Carrie [name]             Date of Birth: 1971/01/09           MRN: [id number]             Visit Date: 02/28/2022 HPI: Ms. Junker comes in today due to left knee pain.  She has known osteoarthritis left knee.  Underwent Synvisc 1 injection for left knee in October.  States that she really got no relief with this.  She has been taking ibuprofen for the pain in the knee.  She states she is now having constant pain in the knee even at rest.  History of right total knee arthroplasty overall right knee is doing well.  No known injury to the left knee.  Pains became worse particularly over the past 2 weeks.  She is diabetic but reports good control of her diabetes.  Denies any fevers or chills.  Review of systems see HPI otherwise negative or noncontributory.  Physical exam: General well-developed well-nourished female no acute distress mood and affect appropriate.  Left knee no abnormal warmth erythema or effusion.  Global tenderness about the left knee.  Crepitus with passive range of motion of the left knee.  Good range of motion of the left knee.  Procedures: Visit Diagnoses:  1. Primary osteoarthritis of left knee     Large Joint Inj: L knee on 02/28/2022 5:10 PM Indications: pain Details: 22 G 1.5 in needle, anterolateral approach  Arthrogram: No  Medications: 3 mL lidocaine 1 %; 40 mg methylPREDNISolone acetate 40 MG/ML Outcome: tolerated well, no immediate complications Procedure, treatment alternatives, risks and benefits explained, specific risks discussed. Consent was given by the patient. Immediately prior to procedure a time out was called to verify the correct patient, procedure, equipment, support staff and site/side marked as required. Patient was prepped and draped in the usual sterile fashion.     Plan: We will see her back on an as-needed basis.  She knows to wait at least 3 months between injections.  Questions encouraged and answered at  length

## 2022-04-03 NOTE — Progress Notes (Unsigned)
GI Office Note    Referring Provider: Jake Samples, PA* Primary Care Physician:  Scherrie Bateman  Primary Gastroenterologist:  Chief Complaint   No chief complaint on file.    History of Present Illness   Carrie [name] is a 52 y.o. female presenting today          Medications   Current Outpatient Medications  Medication Sig Dispense Refill   aspirin 81 MG chewable tablet Chew 1 tablet (81 mg total) by mouth 2 (two) times daily. 30 tablet 0   atorvastatin (LIPITOR) 10 MG tablet Take 10 mg by mouth at bedtime.     estradiol (CLIMARA - DOSED IN MG/24 HR) 0.1 mg/24hr patch Place 0.1 mg onto the skin every Sunday.     ibuprofen (ADVIL) 200 MG tablet Take 800 mg by mouth every 6 (six) hours as needed for moderate pain or headache.     losartan (COZAAR) 100 MG tablet Take 100 mg by mouth daily.     metFORMIN (GLUCOPHAGE) 1000 MG tablet Take 1,000 mg by mouth 2 (two) times daily.     neomycin-polymyxin-hydrocortisone (CORTISPORIN) 3.5-10000-1 OTIC suspension Apply 1-2 drops daily after soaking and cover with bandaid 10 mL 0   pantoprazole (PROTONIX) 40 MG tablet Take 40 mg by mouth daily.     No current facility-administered medications for this visit.    Allergies   Allergies as of 04/04/2022 - Review Complete 02/28/2022  Allergen Reaction Noted   Codeine Other (See Comments) 02/04/2011    Past Medical History   Past Medical History:  Diagnosis Date   Arthritis    Arthrofibrosis of right total knee arthroplasty, subsequent encounter 05/20/2021   Diabetes mellitus without complication ([place])    Essential hypertension    GERD (gastroesophageal reflux disease)    Mixed hyperlipidemia    PONV (postoperative nausea and vomiting)     Past Surgical History   Past Surgical History:  Procedure Laterality Date   ABDOMINAL HYSTERECTOMY     HARDWARE REMOVAL Right 04/02/2021   Procedure: Right Knee HARDWARE REMOVAL;  Surgeon: Mcarthur Rossetti,  MD;  Location: [facility];  Service: Orthopedics;  Laterality: Right;   KNEE ARTHROSCOPY  2009   KNEE CLOSED REDUCTION Right 05/20/2021   Procedure: CLOSED MANIPULATION RIGHT KNEE;  Surgeon: Mcarthur Rossetti, MD;  Location: [place];  Service: Orthopedics;  Laterality: Right;   MASS EXCISION  02/11/2011   Procedure: EXCISION MASS;  Surgeon: Jessy Oto, MD;  Location: [place];  Service: Orthopedics;  Laterality: Right;  resection of neuroma right infrapatellar medial knee   PATELLA ARTHROPLASTY     rt knee-   TOTAL KNEE ARTHROPLASTY  2011   partial-[place] [facility]    TOTAL KNEE ARTHROPLASTY Right 04/02/2021   Procedure: Right TOTAL KNEE ARTHROPLASTY;  Surgeon: Mcarthur Rossetti, MD;  Location: [facility];  Service: Orthopedics;  Laterality: Right;   TUBAL LIGATION      Past Family History   Family History  Problem Relation Age of Onset   Breast cancer Mother    Heart disease Father    Prostate cancer Father     Past Social History   Social History   Socioeconomic History   Marital status: Married    Spouse name: Not on file   Number of children: Not on file   Years of education: Not on file   Highest education level: Not on file  Occupational History   Not on  file  Tobacco Use   Smoking status: Never   Smokeless tobacco: Never  Vaping Use   Vaping Use: Never used  Substance and Sexual Activity   Alcohol use: No    Alcohol/week: 0.0 standard drinks of alcohol   Drug use: No   Sexual activity: Yes    Birth control/protection: Surgical    Comment: hyst  Other Topics Concern   Not on file  Social History Narrative   Not on file   Social Determinants of Health   Financial Resource Strain: Not on file  Food Insecurity: Not on file  Transportation Needs: Not on file  Physical Activity: Not on file  Stress: Not on file  Social Connections: Not on file  Intimate Partner Violence: Not on file    Review of Systems   General:  Negative for anorexia, weight loss, fever, chills, fatigue, weakness. Eyes: Negative for vision changes.  ENT: Negative for hoarseness, difficulty swallowing , nasal congestion. CV: Negative for chest pain, angina, palpitations, dyspnea on exertion, peripheral edema.  Respiratory: Negative for dyspnea at rest, dyspnea on exertion, cough, sputum, wheezing.  GI: See history of present illness. GU:  Negative for dysuria, hematuria, urinary incontinence, urinary frequency, nocturnal urination.  MS: Negative for joint pain, low back pain.  Derm: Negative for rash or itching.  Neuro: Negative for weakness, abnormal sensation, seizure, frequent headaches, memory loss,  confusion.  Psych: Negative for anxiety, depression, suicidal ideation, hallucinations.  Endo: Negative for unusual weight change.  Heme: Negative for bruising or bleeding. Allergy: Negative for rash or hives.  Physical Exam   There were no vitals taken for this visit.   General: Well-nourished, well-developed in no acute distress.  Head: Normocephalic, atraumatic.   Eyes: Conjunctiva pink, no icterus. Mouth: Oropharyngeal mucosa moist and pink , no lesions erythema or exudate. Neck: Supple without thyromegaly, masses, or lymphadenopathy.  Lungs: Clear to auscultation bilaterally.  Heart: Regular rate and rhythm, no murmurs rubs or gallops.  Abdomen: Bowel sounds are normal, nontender, nondistended, no hepatosplenomegaly or masses,  no abdominal bruits or hernia, no rebound or guarding.   Rectal: *** Extremities: No lower extremity edema. No clubbing or deformities.  Neuro: Alert and oriented x 4 , grossly normal neurologically.  Skin: Warm and dry, no rash or jaundice.   Psych: Alert and cooperative, normal mood and affect.  Labs   *** Imaging Studies   No results found.  Assessment       PLAN   ***   Laureen Ochs. Bobby Rumpf, [place], [facility]

## 2022-04-04 ENCOUNTER — Encounter: Payer: Self-pay | Admitting: *Deleted

## 2022-04-04 ENCOUNTER — Encounter: Payer: Self-pay | Admitting: Gastroenterology

## 2022-04-04 ENCOUNTER — Other Ambulatory Visit: Payer: Self-pay | Admitting: *Deleted

## 2022-04-04 ENCOUNTER — Ambulatory Visit: Payer: BC Managed Care – PPO | Admitting: Gastroenterology

## 2022-04-04 VITALS — BP 124/80 | HR 81 | Temp 97.5°F | Ht 59.0 in | Wt 155.6 lb

## 2022-04-04 DIAGNOSIS — R1319 Other dysphagia: Secondary | ICD-10-CM

## 2022-04-04 DIAGNOSIS — Z1211 Encounter for screening for malignant neoplasm of colon: Secondary | ICD-10-CM | POA: Diagnosis not present

## 2022-04-04 DIAGNOSIS — K200 Eosinophilic esophagitis: Secondary | ICD-10-CM | POA: Diagnosis not present

## 2022-04-04 DIAGNOSIS — K5900 Constipation, unspecified: Secondary | ICD-10-CM | POA: Diagnosis not present

## 2022-04-04 MED ORDER — PEG 3350-KCL-NA BICARB-NACL 420 G PO SOLR: 4000.0000 mL | Freq: Once | ORAL | 0 refills | Status: AC

## 2022-04-04 MED ORDER — PANTOPRAZOLE SODIUM 40 MG PO TBEC: 40.0000 mg | DELAYED_RELEASE_TABLET | Freq: Every day | ORAL | 3 refills | Status: DC

## 2022-04-04 NOTE — Patient Instructions (Addendum)
Continue pantoprazole 4m daily. RX sent to [facility]. Upper endoscopy and colonoscopy to be scheduled. See separate instructions. We are giving you samples of Linzess 2921m. Save five capsules, you will take one daily starting six days before your colonoscopy. If you have more than 3-4 stools in a 24 hour period, you can skip the next days dose. Goal is to get your bowels moving well before your colonoscopy prep. For chronic constipation, you can take colace or miralax every day. You can add daily fiber supplement such as benefiber or fiberchoice. Make sure you are drinking adequate fluids to keep your urine light in color.    It was a pleasure to see you today. I want to create trusting relationships with patients and provide genuine, compassionate, and quality care. I truly value your feedback, so please be on the lookout for a survey regarding your visit with me today. I appreciate your time in completing this!

## 2022-04-06 ENCOUNTER — Encounter: Payer: Self-pay | Admitting: Gastroenterology

## 2022-04-07 NOTE — Progress Notes (Signed)
Received records from [facility]:  EGD November 2021: Esophageal mucosal changes suggestive of eosinophilic esophagitis status post dilation.  (Pathology showing intraepithelial eosinophils (17 per high-power field).  Also with chronic inactive gastritis negative for H. pylori.  She was treated with PPI therapy.

## 2022-04-07 NOTE — H&P (View-Only) (Signed)
Received records from [facility]:  EGD November 2021: Esophageal mucosal changes suggestive of eosinophilic esophagitis status post dilation.  (Pathology showing intraepithelial eosinophils (17 per high-power field).  Also with chronic inactive gastritis negative for H. pylori.  She was treated with PPI therapy. 

## 2022-04-19 ENCOUNTER — Other Ambulatory Visit (HOSPITAL_COMMUNITY)
Admission: RE | Admit: 2022-04-19 | Discharge: 2022-04-19 | Disposition: A | Discharge status: Home / Self Care | Payer: BC Managed Care – PPO | Source: Ambulatory Visit | Attending: Internal Medicine | Admitting: Internal Medicine

## 2022-04-19 DIAGNOSIS — K200 Eosinophilic esophagitis: Secondary | ICD-10-CM | POA: Insufficient documentation

## 2022-04-19 DIAGNOSIS — Z1211 Encounter for screening for malignant neoplasm of colon: Secondary | ICD-10-CM | POA: Diagnosis not present

## 2022-04-19 DIAGNOSIS — R1319 Other dysphagia: Secondary | ICD-10-CM | POA: Diagnosis not present

## 2022-04-19 LAB — BASIC METABOLIC PANEL
Anion gap: 10 (ref 5–15)
BUN: 13 mg/dL (ref 6–20)
CO2: 26 mmol/L (ref 22–32)
Calcium: 8.8 mg/dL — ABNORMAL LOW (ref 8.9–10.3)
Chloride: 101 mmol/L (ref 98–111)
Creatinine, Ser: 0.78 mg/dL (ref 0.44–1.00)
GFR, Estimated: >60 mL/min (ref >60)
Glucose, Bld: 110 mg/dL — ABNORMAL HIGH (ref 70–99)
Potassium: 4.4 mmol/L (ref 3.5–5.1)
Sodium: 137 mmol/L (ref 135–145)

## 2022-04-21 ENCOUNTER — Encounter: Payer: Self-pay | Admitting: Radiology

## 2022-04-25 ENCOUNTER — Ambulatory Visit (HOSPITAL_COMMUNITY)
Admission: RE | Admit: 2022-04-25 | Discharge: 2022-04-25 | Disposition: A | Discharge status: Home / Self Care | Payer: BC Managed Care – PPO | Attending: Internal Medicine | Admitting: Internal Medicine

## 2022-04-25 ENCOUNTER — Other Ambulatory Visit: Payer: Self-pay

## 2022-04-25 ENCOUNTER — Encounter (HOSPITAL_COMMUNITY)
Admission: RE | Disposition: A | Discharge status: Home / Self Care | Payer: Self-pay | Source: Home / Self Care | Attending: Internal Medicine

## 2022-04-25 ENCOUNTER — Ambulatory Visit (HOSPITAL_COMMUNITY): Payer: BC Managed Care – PPO | Admitting: Anesthesiology

## 2022-04-25 ENCOUNTER — Encounter (HOSPITAL_COMMUNITY): Payer: Self-pay | Admitting: Internal Medicine

## 2022-04-25 DIAGNOSIS — E119 Type 2 diabetes mellitus without complications: Secondary | ICD-10-CM | POA: Insufficient documentation

## 2022-04-25 DIAGNOSIS — Z1211 Encounter for screening for malignant neoplasm of colon: Secondary | ICD-10-CM | POA: Insufficient documentation

## 2022-04-25 DIAGNOSIS — Z7984 Long term (current) use of oral hypoglycemic drugs: Secondary | ICD-10-CM | POA: Insufficient documentation

## 2022-04-25 DIAGNOSIS — K5909 Other constipation: Secondary | ICD-10-CM | POA: Diagnosis not present

## 2022-04-25 DIAGNOSIS — Z800 Family history of malignant neoplasm of digestive organs: Secondary | ICD-10-CM | POA: Insufficient documentation

## 2022-04-25 DIAGNOSIS — K229 Disease of esophagus, unspecified: Secondary | ICD-10-CM | POA: Diagnosis not present

## 2022-04-25 DIAGNOSIS — D124 Benign neoplasm of descending colon: Secondary | ICD-10-CM | POA: Diagnosis not present

## 2022-04-25 DIAGNOSIS — K635 Polyp of colon: Secondary | ICD-10-CM | POA: Diagnosis not present

## 2022-04-25 DIAGNOSIS — K219 Gastro-esophageal reflux disease without esophagitis: Secondary | ICD-10-CM | POA: Insufficient documentation

## 2022-04-25 DIAGNOSIS — I10 Essential (primary) hypertension: Secondary | ICD-10-CM | POA: Diagnosis not present

## 2022-04-25 DIAGNOSIS — R1310 Dysphagia, unspecified: Secondary | ICD-10-CM | POA: Insufficient documentation

## 2022-04-25 DIAGNOSIS — K200 Eosinophilic esophagitis: Secondary | ICD-10-CM | POA: Diagnosis not present

## 2022-04-25 HISTORY — PX: POLYPECTOMY: SHX5525

## 2022-04-25 HISTORY — PX: COLONOSCOPY WITH PROPOFOL: SHX5780

## 2022-04-25 HISTORY — PX: ESOPHAGOGASTRODUODENOSCOPY (EGD) WITH PROPOFOL: SHX5813

## 2022-04-25 HISTORY — PX: MALONEY DILATION: SHX5535

## 2022-04-25 HISTORY — PX: BIOPSY: SHX5522

## 2022-04-25 LAB — GLUCOSE, CAPILLARY: Glucose-Capillary: 125 mg/dL — ABNORMAL HIGH (ref 70–99)

## 2022-04-25 SURGERY — COLONOSCOPY WITH PROPOFOL
Anesthesia: General

## 2022-04-25 MED ORDER — PROPOFOL 10 MG/ML IV BOLUS
INTRAVENOUS | Status: DC | PRN
  Administered 2022-04-25: 80 mg via INTRAVENOUS

## 2022-04-25 MED ORDER — PROPOFOL 500 MG/50ML IV EMUL
INTRAVENOUS | Status: DC | PRN
  Administered 2022-04-25: 180 ug/kg/min via INTRAVENOUS

## 2022-04-25 MED ORDER — PROPOFOL 500 MG/50ML IV EMUL
INTRAVENOUS | Status: AC
  Filled 2022-04-25: qty 50

## 2022-04-25 MED ORDER — LIDOCAINE HCL (PF) 2 % IJ SOLN
INTRAMUSCULAR | Status: DC | PRN
  Administered 2022-04-25: 50 mg via INTRADERMAL

## 2022-04-25 MED ORDER — LACTATED RINGERS IV SOLN: INTRAVENOUS | Status: DC

## 2022-04-25 MED ORDER — STERILE WATER FOR IRRIGATION IR SOLN
Status: DC | PRN
  Administered 2022-04-25: 120 mL

## 2022-04-25 MED ORDER — LACTATED RINGERS IV SOLN: INTRAVENOUS | Status: DC | PRN

## 2022-04-25 NOTE — Op Note (Signed)
[facility] Patient Name: Carrie [name] Procedure Date: 04/25/2022 8:35 AM MRN: [id number] Date of Birth: 1970-09-25 Attending MD: Norvel Richards , MD, [id number] CSN: [id number] Age: 52 Admit Type: Outpatient Procedure:                Upper GI endoscopy Indications:              Dysphagia Providers:                Norvel Richards, MD, Lambert Mody,                            Kristine L. Risa Grill, Technician Referring MD:              Medicines:                Propofol per Anesthesia Complications:            No immediate complications. Estimated Blood Loss:     Estimated blood loss was minimal. Procedure:                Pre-Anesthesia Assessment:                           - Prior to the procedure, a History and Physical                            was performed, and patient medications and                            allergies were reviewed. The patient's tolerance of                            previous anesthesia was also reviewed. The risks                            and benefits of the procedure and the sedation                            options and risks were discussed with the patient.                            All questions were answered, and informed consent                            was obtained. Prior Anticoagulants: The patient has                            taken no anticoagulant or antiplatelet agents. ASA                            Grade Assessment: II - A patient with mild systemic                            disease. After reviewing the risks and benefits,  the patient was deemed in satisfactory condition to                            undergo the procedure.                           After obtaining informed consent, the endoscope was                            passed under direct vision. Throughout the                            procedure, the patient's blood pressure, pulse, and                            oxygen  saturations were monitored continuously. The                            [id number]) scope was introduced through the                            mouth, and advanced to the third part of duodenum.                            The upper GI endoscopy was accomplished without                            difficulty. The patient tolerated the procedure                            well. Scope In: [id number] AM Scope Out: 8:57:47 AM Total Procedure Duration: 0 hours 10 minutes 50 seconds  Findings:      Minimally "ring" tubular esophagus. Mucosa otherwise appeared normal.       Esophagus easily traversed with the adult gastroscope..      The entire examined stomach was normal.      The duodenal bulb, second portion of the duodenum and third portion of       the duodenum were normal. The scope was withdrawn. Dilation was       performed with a Maloney dilator with moderate resistance at 52 Fr. The       dilation site was examined following endoscope reinsertion and showed no       change. Estimated blood loss: none. Finally, the mid and distal       esophagus were biopsied for histologic study. Impression:               - Abnormal esophagus of uncertain significance.                            Status post dilation followed by biopsy.                           - Normal stomach.                           - Normal duodenal  bulb, second portion of the                            duodenum and third portion of the duodenum. Moderate Sedation:      Moderate (conscious) sedation was personally administered by an       anesthesia professional. The following parameters were monitored: oxygen       saturation, heart rate, blood pressure, respiratory rate, EKG, adequacy       of pulmonary ventilation, and response to care. Recommendation:           - Patient has a contact number available for                            emergencies. The signs and symptoms of potential                            delayed  complications were discussed with the                            patient. Return to normal activities tomorrow.                            Written discharge instructions were provided to the                            patient.                           - Advance diet as tolerated.                           - Continue present medications.                           - Return to my office in 3 months. Take Protonix 40                            mg daily 30 minutes before breakfast. Take every                            day without fail. Further recommendations to follow                            pending review of pathology report. Procedure Code(s):        --- Professional ---                           [phone], Esophagogastroduodenoscopy, flexible,                            transoral; diagnostic, including collection of                            specimen(s) by brushing or washing, when performed                            (  separate procedure)                           43450, Dilation of esophagus, by unguided sound or                            bougie, single or multiple passes Diagnosis Code(s):        --- Professional ---                           R13.10, Dysphagia, unspecified CPT copyright 2022 American Medical Association. All rights reserved. The codes documented in this report are preliminary and upon coder review may  be revised to meet current compliance requirements. Cristopher Estimable. Tyanna Hach, MD Norvel Richards, MD 04/25/2022 9:20:10 AM This report has been signed electronically. Number of Addenda: 0

## 2022-04-25 NOTE — Interval H&P Note (Signed)
History and Physical Interval Note:  04/25/2022 8:37 AM  Carrie [name]  has presented today for surgery, with the diagnosis of screening colonscopy,dysphagia,EOE.  The various methods of treatment have been discussed with the patient and family. After consideration of risks, benefits and other options for treatment, the patient has consented to  Procedure(s) with comments: COLONOSCOPY WITH PROPOFOL (N/A) - 8:45 am, asa 2 ESOPHAGOGASTRODUODENOSCOPY (EGD) WITH PROPOFOL (N/A) MALONEY DILATION (N/A) as a surgical intervention.  The patient's history has been reviewed, patient examined, no change in status, stable for surgery.  I have reviewed the patient's chart and labs.  Questions were answered to the patient's satisfaction.     Jennafer Gladue   No change.  EGD with esophageal dilation as feasible/appropriate, biopsies potential.  And first-ever screening colonoscopy per plan..  The risks, benefits, limitations, imponderables and alternatives regarding both EGD and colonoscopy have been reviewed with the patient. Questions have been answered. All parties agreeable.

## 2022-04-25 NOTE — Discharge Instructions (Addendum)
EGD Discharge instructions Please read the instructions outlined below and refer to this sheet in the next few weeks. These discharge instructions provide you with general information on caring for yourself after you leave the hospital. Your doctor may also give you specific instructions. While your treatment has been planned according to the most current medical practices available, unavoidable complications occasionally occur. If you have any problems or questions after discharge, please call your doctor. ACTIVITY You may resume your regular activity but move at a slower pace for the next 24 hours.  Take frequent rest periods for the next 24 hours.  Walking will help expel (get rid of) the air and reduce the bloated feeling in your abdomen.  No driving for 24 hours (because of the anesthesia (medicine) used during the test).  You may shower.  Do not sign any important legal documents or operate any machinery for 24 hours (because of the anesthesia used during the test).  NUTRITION Drink plenty of fluids.  You may resume your normal diet.  Begin with a light meal and progress to your normal diet.  Avoid alcoholic beverages for 24 hours or as instructed by your caregiver.  MEDICATIONS You may resume your normal medications unless your caregiver tells you otherwise.  WHAT YOU CAN EXPECT TODAY You may experience abdominal discomfort such as a feeling of fullness or "gas" pains.  FOLLOW-UP Your doctor will discuss the results of your test with you.  SEEK IMMEDIATE MEDICAL ATTENTION IF ANY OF THE FOLLOWING OCCUR: Excessive nausea (feeling sick to your stomach) and/or vomiting.  Severe abdominal pain and distention (swelling).  Trouble swallowing.  Temperature over 101 F (37.8 C).  Rectal bleeding or vomiting of blood.      Colonoscopy Discharge Instructions  Read the instructions outlined below and refer to this sheet in the next few weeks. These discharge instructions provide you with  general information on caring for yourself after you leave the hospital. Your doctor may also give you specific instructions. While your treatment has been planned according to the most current medical practices available, unavoidable complications occasionally occur. If you have any problems or questions after discharge, call Dr. Gala Romney at [phone]. ACTIVITY You may resume your regular activity, but move at a slower pace for the next 24 hours.  Take frequent rest periods for the next 24 hours.  Walking will help get rid of the air and reduce the bloated feeling in your belly (abdomen).  No driving for 24 hours (because of the medicine (anesthesia) used during the test).   Do not sign any important legal documents or operate any machinery for 24 hours (because of the anesthesia used during the test).  NUTRITION Drink plenty of fluids.  You may resume your normal diet as instructed by your doctor.  Begin with a light meal and progress to your normal diet. Heavy or fried foods are harder to digest and may make you feel sick to your stomach (nauseated).  Avoid alcoholic beverages for 24 hours or as instructed.  MEDICATIONS You may resume your normal medications unless your doctor tells you otherwise.  WHAT YOU CAN EXPECT TODAY Some feelings of bloating in the abdomen.  Passage of more gas than usual.  Spotting of blood in your stool or on the toilet paper.  IF YOU HAD POLYPS REMOVED DURING THE COLONOSCOPY: No aspirin products for 7 days or as instructed.  No alcohol for 7 days or as instructed.  Eat a soft diet for the next 24 hours.  FINDING OUT THE RESULTS OF YOUR TEST Not all test results are available during your visit. If your test results are not back during the visit, make an appointment with your caregiver to find out the results. Do not assume everything is normal if you have not heard from your caregiver or the medical facility. It is important for you to follow up on all of your test  results.  SEEK IMMEDIATE MEDICAL ATTENTION IF: You have more than a spotting of blood in your stool.  Your belly is swollen (abdominal distention).  You are nauseated or vomiting.  You have a temperature over 101.   You have abdominal pain or discomfort that is severe or gets worse throughout the day.  Your esophagus was dilated today and biopsied  1 polyp found in your colon.  It was removed.  Continue taking Protonix 40 mg every day.  It is important takes medication every day 30 minutes before breakfast without fail. Further recommendations to follow pending review of pathology report  Office visit with Neil Crouch in 3 months OFFICE TO CALL WITH APPOINTMENT  Patient, I called [facility] at [phone] -call rolled to voicemail.  I left a message.

## 2022-04-25 NOTE — Anesthesia Procedure Notes (Signed)
Procedure Name: General with mask airway Date/Time: 04/25/2022 8:45 AM  Performed by: Maude Leriche, CRNAPre-anesthesia Checklist: Patient identified, Emergency Drugs available, Suction available, Patient being monitored and Timeout performed Patient Re-evaluated:Patient Re-evaluated prior to induction Oxygen Delivery Method: Nasal cannula Placement Confirmation: positive ETCO2 Dental Injury: Teeth and Oropharynx as per pre-operative assessment

## 2022-04-25 NOTE — Anesthesia Postprocedure Evaluation (Signed)
Anesthesia Post Note  Patient: Carrie [name]  Procedure(s) Performed: COLONOSCOPY WITH PROPOFOL ESOPHAGOGASTRODUODENOSCOPY (EGD) WITH PROPOFOL Reid POLYPECTOMY  Patient location during evaluation: Phase II Anesthesia Type: General Level of consciousness: awake Pain management: pain level controlled Vital Signs Assessment: post-procedure vital signs reviewed and stable Respiratory status: spontaneous breathing and respiratory function stable Cardiovascular status: blood pressure returned to baseline and stable Postop Assessment: no headache and no apparent nausea or vomiting Anesthetic complications: no Comments: Late entry   No notable events documented.   Last Vitals:  Vitals:   04/25/22 0917 04/25/22 0922  BP: (!) 99/54 103/62  Pulse: 78   Resp: (!) 22   Temp: 36.5 C   SpO2: 94% 96%    Last Pain:  Vitals:   04/25/22 0923  TempSrc:   PainSc: 0-No pain                 Louann Sjogren

## 2022-04-25 NOTE — Transfer of Care (Signed)
Immediate Anesthesia Transfer of Care Note  Patient: Carrie [name]  Procedure(s) Performed: COLONOSCOPY WITH PROPOFOL ESOPHAGOGASTRODUODENOSCOPY (EGD) WITH PROPOFOL MALONEY DILATION BIOPSY POLYPECTOMY  Patient Location: PACU  Anesthesia Type:General  Level of Consciousness: awake, alert , and oriented  Airway & Oxygen Therapy: Patient Spontanous Breathing  Post-op Assessment: Report given to RN, Post -op Vital signs reviewed and stable, Patient moving all extremities X 4, and Patient able to stick tongue midline  Post vital signs: Reviewed  Last Vitals:  Vitals Value Taken Time  BP 99/54 04/25/22 0917  Temp 36.5 C 04/25/22 0917  Pulse 78 04/25/22 0917  Resp 22 04/25/22 0917  SpO2 94 % 04/25/22 0917    Last Pain:  Vitals:   04/25/22 0917  TempSrc: Axillary  PainSc:       Patients Stated Pain Goal: 8 ([id number]  Complications: No notable events documented.

## 2022-04-25 NOTE — Anesthesia Preprocedure Evaluation (Signed)
Anesthesia Evaluation  Patient identified by MRN, date of birth, ID band Patient awake    Reviewed: Allergy & Precautions, H&P , NPO status , Patient's Chart, lab work & pertinent test results, reviewed documented beta blocker date and time   History of Anesthesia Complications (+) PONV and history of anesthetic complications  Airway Mallampati: II  TM Distance: >3 FB Neck ROM: full    Dental no notable dental hx.    Pulmonary neg pulmonary ROS   Pulmonary exam normal breath sounds clear to auscultation       Cardiovascular Exercise Tolerance: Good hypertension, negative cardio ROS  Rhythm:regular Rate:Normal     Neuro/Psych negative neurological ROS  negative psych ROS   GI/Hepatic negative GI ROS, Neg liver ROS,GERD  ,,  Endo/Other  negative endocrine ROSdiabetes    Renal/GU negative Renal ROS  negative genitourinary   Musculoskeletal   Abdominal   Peds  Hematology negative hematology ROS (+)   Anesthesia Other Findings   Reproductive/Obstetrics negative OB ROS                             Anesthesia Physical Anesthesia Plan  ASA: 2  Anesthesia Plan: General   Post-op Pain Management:    Induction:   PONV Risk Score and Plan: Propofol infusion  Airway Management Planned:   Additional Equipment:   Intra-op Plan:   Post-operative Plan:   Informed Consent: I have reviewed the patients History and Physical, chart, labs and discussed the procedure including the risks, benefits and alternatives for the proposed anesthesia with the patient or authorized representative who has indicated his/her understanding and acceptance.     Dental Advisory Given  Plan Discussed with: CRNA  Anesthesia Plan Comments:        Anesthesia Quick Evaluation

## 2022-04-25 NOTE — Op Note (Signed)
[facility] Patient Name: Carrie [name] Procedure Date: 04/25/2022 9:00 AM MRN: [id number] Date of Birth: May 02, 1970 Attending MD: Norvel Richards , MD, [id number] CSN: [id number] Age: 52 Admit Type: Outpatient Procedure:                Colonoscopy Indications:              Screening for colorectal malignant neoplasm Providers:                Norvel Richards, MD, Lambert Mody,                            Kristine L. Risa Grill, Technician Referring MD:              Medicines:                Propofol per Anesthesia Complications:            No immediate complications. Estimated Blood Loss:     Estimated blood loss was minimal. Procedure:                Pre-Anesthesia Assessment:                           - Prior to the procedure, a History and Physical                            was performed, and patient medications and                            allergies were reviewed. The patient's tolerance of                            previous anesthesia was also reviewed. The risks                            and benefits of the procedure and the sedation                            options and risks were discussed with the patient.                            All questions were answered, and informed consent                            was obtained. Prior Anticoagulants: The patient has                            taken no anticoagulant or antiplatelet agents. ASA                            Grade Assessment: II - A patient with mild systemic                            disease. After reviewing the risks and benefits,  the patient was deemed in satisfactory condition to                            undergo the procedure.                           After obtaining informed consent, the colonoscope                            was passed under direct vision. Throughout the                            procedure, the patient's blood pressure, pulse, and                             oxygen saturations were monitored continuously. The                            [id number]) scope was introduced through                            the anus and advanced to the the cecum, identified                            by appendiceal orifice and ileocecal valve. The                            colonoscopy was performed without difficulty. The                            patient tolerated the procedure well. The quality                            of the bowel preparation was adequate. The terminal                            ileum, ileocecal valve, appendiceal orifice, and                            rectum and the ileocecal valve, appendiceal                            orifice, and rectum were photographed. Scope In: 9:02:45 AM Scope Out: 9:13:09 AM Scope Withdrawal Time: 0 hours 5 minutes 12 seconds  Total Procedure Duration: 0 hours 10 minutes 24 seconds  Findings:      A 5 mm polyp was found in the mid descending colon. The polyp was       sessile. The polyp was removed with a cold snare. Resection and       retrieval were complete. Estimated blood loss was minimal.      The exam was otherwise without abnormality on direct views. Rectum seen       well on?"face. Rectal vault too small to retroflex. Impression:               - One  5 mm polyp in the mid descending colon,                            removed with a cold snare. Resected and retrieved.                           - The examination was otherwise normal on direct                            and retroflexion views. Moderate Sedation:      Moderate (conscious) sedation was personally administered by an       anesthesia professional. The following parameters were monitored: oxygen       saturation, heart rate, blood pressure, respiratory rate, EKG, adequacy       of pulmonary ventilation, and response to care. Recommendation:           - Patient has a contact number available for                             emergencies. The signs and symptoms of potential                            delayed complications were discussed with the                            patient. Return to normal activities tomorrow.                            Written discharge instructions were provided to the                            patient.                           - Advance diet as tolerated.                           - Continue present medications.                           - Repeat colonoscopy date to be determined after                            pending pathology results are reviewed for                            surveillance.                           - Return to GI office in 3 months. See EGD report. Procedure Code(s):        --- Professional ---                           [phone], Colonoscopy, flexible; with removal of  tumor(s), polyp(s), or other lesion(s) by snare                            technique Diagnosis Code(s):        --- Professional ---                           Z12.11, Encounter for screening for malignant                            neoplasm of colon                           D12.4, Benign neoplasm of descending colon CPT copyright 2022 [employer]. All rights reserved. The codes documented in this report are preliminary and upon coder review may  be revised to meet current compliance requirements. Cristopher Estimable. Adelise Buswell, MD Norvel Richards, MD 04/25/2022 9:23:33 AM This report has been signed electronically. Number of Addenda: 0

## 2022-04-27 LAB — SURGICAL PATHOLOGY

## 2022-04-30 ENCOUNTER — Encounter: Payer: Self-pay | Admitting: Internal Medicine

## 2022-05-04 ENCOUNTER — Encounter (HOSPITAL_COMMUNITY): Payer: Self-pay | Admitting: Internal Medicine

## 2022-06-09 DIAGNOSIS — L821 Other seborrheic keratosis: Secondary | ICD-10-CM | POA: Diagnosis not present

## 2022-06-09 DIAGNOSIS — I781 Nevus, non-neoplastic: Secondary | ICD-10-CM | POA: Diagnosis not present

## 2022-06-09 DIAGNOSIS — L820 Inflamed seborrheic keratosis: Secondary | ICD-10-CM | POA: Diagnosis not present

## 2022-06-09 DIAGNOSIS — D2371 Other benign neoplasm of skin of right lower limb, including hip: Secondary | ICD-10-CM | POA: Diagnosis not present

## 2022-07-12 ENCOUNTER — Encounter: Payer: Self-pay | Admitting: Gastroenterology

## 2022-08-09 ENCOUNTER — Encounter: Payer: Self-pay | Admitting: Gastroenterology

## 2022-08-09 ENCOUNTER — Ambulatory Visit (INDEPENDENT_AMBULATORY_CARE_PROVIDER_SITE_OTHER): Payer: BC Managed Care – PPO | Admitting: Gastroenterology

## 2022-08-09 VITALS — BP 139/84 | HR 79 | Temp 97.3°F | Ht 59.0 in | Wt 161.0 lb

## 2022-08-09 DIAGNOSIS — R1319 Other dysphagia: Secondary | ICD-10-CM | POA: Diagnosis not present

## 2022-08-09 DIAGNOSIS — K200 Eosinophilic esophagitis: Secondary | ICD-10-CM | POA: Diagnosis not present

## 2022-08-09 NOTE — Progress Notes (Signed)
GI Office Note    Referring Provider: Avis Epley, PA* Primary Care Physician:  Ladon Applebaum  Primary Gastroenterologist: Roetta Sessions, MD   Chief Complaint   Chief Complaint  Patient presents with   Follow-up    Doing well    History of Present Illness   Carrie [name] is a 52 y.o. female presenting today for follow up. Last seen during ov in 03/2022. She has h/o EOE, used to be followed by [facility] treated with PPI. She also has chronic constipation.  Doing very well at this time.  Esophageal dysphagia resolved.  No heartburn or indigestion.  Bowel movements regular.  No blood in the stool or melena.  No abdominal pain.  Colonoscopy 04/2022: -one 5mm polyp in mid descending colon removed, tubular adenoma -next colonoscopy 7 years  EGD 04/2022: -abnormal esophagus of uncertain significance s/p dilation and bx (mild inflammation c/w acid reflux)  Medications   Current Outpatient Medications  Medication Sig Dispense Refill   aspirin 81 MG chewable tablet Chew 1 tablet (81 mg total) by mouth 2 (two) times daily. (Patient taking differently: Chew 81 mg by mouth daily.) 30 tablet 0   atorvastatin (LIPITOR) 10 MG tablet Take 10 mg by mouth at bedtime.     estradiol (CLIMARA - DOSED IN MG/24 HR) 0.1 mg/24hr patch Place 0.1 mg onto the skin every Sunday.     ibuprofen (ADVIL) 200 MG tablet Take 800 mg by mouth every 6 (six) hours as needed for moderate pain or headache.     LASIX 20 MG tablet Take 20 mg by mouth daily.     levothyroxine (SYNTHROID) 25 MCG tablet Take 25 mcg by mouth daily before breakfast.     losartan (COZAAR) 100 MG tablet Take 100 mg by mouth daily.     metFORMIN (GLUCOPHAGE) 1000 MG tablet Take 1,000 mg by mouth 2 (two) times daily.     pantoprazole (PROTONIX) 40 MG tablet Take 1 tablet (40 mg total) by mouth daily before breakfast. 90 tablet 3   No current facility-administered medications for this visit.    Allergies    Allergies as of 08/09/2022 - Review Complete 08/09/2022  Allergen Reaction Noted   Codeine Other (See Comments) 02/04/2011       Review of Systems   General: Negative for anorexia, weight loss, fever, chills, fatigue, weakness. ENT: Negative for hoarseness, difficulty swallowing , nasal congestion. CV: Negative for chest pain, angina, palpitations, dyspnea on exertion, peripheral edema.  Respiratory: Negative for dyspnea at rest, dyspnea on exertion, cough, sputum, wheezing.  GI: See history of present illness. GU:  Negative for dysuria, hematuria, urinary incontinence, urinary frequency, nocturnal urination.  Endo: Negative for unusual weight change.     Physical Exam   BP 139/84 (BP Location: Right Arm, Patient Position: Sitting, Cuff Size: Normal)   Pulse 79   Temp (!) 97.3 F (36.3 C) (Oral)   Ht 4\' 11"  (1.499 m)   Wt 161 lb (73 kg)   SpO2 98%   BMI 32.52 kg/m    General: Well-nourished, well-developed in no acute distress.  Eyes: No icterus. Mouth: Oropharyngeal mucosa moist and pink  Extremities: No lower extremity edema. No clubbing or deformities. Neuro: Alert and oriented x 4   Skin: Warm and dry, no jaundice.   Psych: Alert and cooperative, normal mood and affect.  Labs   Lab Results  Component Value Date   CREATININE 0.78 04/19/2022   BUN 13 04/19/2022  NA 137 04/19/2022   K 4.4 04/19/2022   CL 101 04/19/2022   CO2 26 04/19/2022    Imaging Studies   No results found.  Assessment   GERD/EOE: Doing well.  History of adenomatous colon polyps: Surveillance colonoscopy in 7 years.   PLAN   Continue pantoprazole 40 mg daily before breakfast. Return office visit in 1 year.   Leanna Battles. Melvyn Neth, MHS, PA-C [facility]

## 2022-08-09 NOTE — Patient Instructions (Signed)
Continue pantoprazole 40mg  daily before breakfast.  We will see you back in one year or call sooner if needed.

## 2022-10-12 ENCOUNTER — Encounter: Payer: Self-pay | Admitting: Family Medicine

## 2022-10-20 ENCOUNTER — Other Ambulatory Visit (HOSPITAL_COMMUNITY): Payer: Self-pay | Admitting: Family Medicine

## 2022-10-20 DIAGNOSIS — N63 Unspecified lump in unspecified breast: Secondary | ICD-10-CM

## 2022-10-25 DIAGNOSIS — E6609 Other obesity due to excess calories: Secondary | ICD-10-CM | POA: Diagnosis not present

## 2022-10-25 DIAGNOSIS — G43909 Migraine, unspecified, not intractable, without status migrainosus: Secondary | ICD-10-CM | POA: Diagnosis not present

## 2022-10-25 DIAGNOSIS — R42 Dizziness and giddiness: Secondary | ICD-10-CM | POA: Diagnosis not present

## 2022-10-25 DIAGNOSIS — Z6830 Body mass index (BMI) 30.0-30.9, adult: Secondary | ICD-10-CM | POA: Diagnosis not present

## 2022-11-03 DIAGNOSIS — G43719 Chronic migraine without aura, intractable, without status migrainosus: Secondary | ICD-10-CM | POA: Diagnosis not present

## 2022-11-03 DIAGNOSIS — Z79899 Other long term (current) drug therapy: Secondary | ICD-10-CM | POA: Diagnosis not present

## 2022-11-03 DIAGNOSIS — Z049 Encounter for examination and observation for unspecified reason: Secondary | ICD-10-CM | POA: Diagnosis not present

## 2022-11-10 ENCOUNTER — Ambulatory Visit (HOSPITAL_COMMUNITY)
Admission: RE | Admit: 2022-11-10 | Discharge: 2022-11-10 | Disposition: A | Discharge status: Home / Self Care | Payer: BC Managed Care – PPO | Source: Ambulatory Visit | Attending: Family Medicine | Admitting: Family Medicine

## 2022-11-10 ENCOUNTER — Encounter (HOSPITAL_COMMUNITY): Payer: Self-pay

## 2022-11-10 DIAGNOSIS — N63 Unspecified lump in unspecified breast: Secondary | ICD-10-CM | POA: Insufficient documentation

## 2022-11-10 DIAGNOSIS — N6322 Unspecified lump in the left breast, upper inner quadrant: Secondary | ICD-10-CM | POA: Diagnosis not present

## 2022-11-10 DIAGNOSIS — R921 Mammographic calcification found on diagnostic imaging of breast: Secondary | ICD-10-CM | POA: Diagnosis not present

## 2022-11-10 DIAGNOSIS — R92323 Mammographic fibroglandular density, bilateral breasts: Secondary | ICD-10-CM | POA: Diagnosis not present

## 2022-11-16 DIAGNOSIS — M542 Cervicalgia: Secondary | ICD-10-CM | POA: Diagnosis not present

## 2022-11-16 DIAGNOSIS — G43719 Chronic migraine without aura, intractable, without status migrainosus: Secondary | ICD-10-CM | POA: Diagnosis not present

## 2022-11-22 ENCOUNTER — Ambulatory Visit (HOSPITAL_COMMUNITY): Payer: BC Managed Care – PPO

## 2022-11-22 ENCOUNTER — Encounter (HOSPITAL_COMMUNITY): Payer: BC Managed Care – PPO

## 2022-11-30 DIAGNOSIS — M542 Cervicalgia: Secondary | ICD-10-CM | POA: Diagnosis not present

## 2022-11-30 DIAGNOSIS — G43719 Chronic migraine without aura, intractable, without status migrainosus: Secondary | ICD-10-CM | POA: Diagnosis not present

## 2022-12-14 DIAGNOSIS — M542 Cervicalgia: Secondary | ICD-10-CM | POA: Diagnosis not present

## 2022-12-14 DIAGNOSIS — G43719 Chronic migraine without aura, intractable, without status migrainosus: Secondary | ICD-10-CM | POA: Diagnosis not present

## 2023-01-03 DIAGNOSIS — G43719 Chronic migraine without aura, intractable, without status migrainosus: Secondary | ICD-10-CM | POA: Diagnosis not present

## 2023-01-03 DIAGNOSIS — M542 Cervicalgia: Secondary | ICD-10-CM | POA: Diagnosis not present

## 2023-01-04 NOTE — Telephone Encounter (Signed)
Opened spot, called patient.

## 2023-01-09 ENCOUNTER — Other Ambulatory Visit (INDEPENDENT_AMBULATORY_CARE_PROVIDER_SITE_OTHER): Payer: Self-pay

## 2023-01-09 ENCOUNTER — Other Ambulatory Visit (INDEPENDENT_AMBULATORY_CARE_PROVIDER_SITE_OTHER): Payer: BC Managed Care – PPO

## 2023-01-09 ENCOUNTER — Encounter: Payer: Self-pay | Admitting: Physician Assistant

## 2023-01-09 ENCOUNTER — Ambulatory Visit: Payer: BC Managed Care – PPO | Admitting: Physician Assistant

## 2023-01-09 DIAGNOSIS — Z96651 Presence of right artificial knee joint: Secondary | ICD-10-CM | POA: Diagnosis not present

## 2023-01-09 DIAGNOSIS — M25562 Pain in left knee: Secondary | ICD-10-CM

## 2023-01-09 MED ORDER — METHYLPREDNISOLONE ACETATE 40 MG/ML IJ SUSP
40.0000 mg | INTRAMUSCULAR | Status: AC | PRN
  Administered 2023-01-09: 40 mg via INTRA_ARTICULAR

## 2023-01-09 MED ORDER — LIDOCAINE HCL 1 % IJ SOLN
3.0000 mL | INTRAMUSCULAR | Status: AC | PRN
  Administered 2023-01-09: 3 mL

## 2023-01-09 NOTE — Progress Notes (Signed)
Office Visit Note   Patient: Carrie [name]           Date of Birth: 18-Jun-1970           MRN: [id number] Visit Date: 01/09/2023              Requested by: Avis Epley, PA-C [address] [place],  [place] [address] PCP: Avis Epley, PA-C   Assessment & Plan: Visit Diagnoses:  1. Hx of total knee arthroplasty, right   2. Acute pain of left knee     Plan: Reassurance was given that the right knee replacement appears well-seated with no acute fractures.  In regards to the left knee she has known osteoarthritis and I feel this is what is mostly driving her pain.  Therefore recommend cortisone injection she was agreeable.  Follow-Up Instructions: Return if symptoms worsen or fail to improve.   Orders:  Orders Placed This Encounter  Procedures   Large Joint Inj: L knee   XR Knee 1-2 Views Right   XR Knee 1-2 Views Left   No orders of the defined types were placed in this encounter.     Procedures: Large Joint Inj: L knee on 01/09/2023 12:28 PM Indications: pain Details: 22 G 1.5 in needle, anterolateral approach  Arthrogram: No  Medications: 3 mL lidocaine 1 %; 40 mg methylPREDNISolone acetate 40 MG/ML Outcome: tolerated well, no immediate complications Procedure, treatment alternatives, risks and benefits explained, specific risks discussed. Consent was given by the patient. Immediately prior to procedure a time out was called to verify the correct patient, procedure, equipment, support staff and site/side marked as required. Patient was prepped and draped in the usual sterile fashion.       Clinical Data: No additional findings.   Subjective: Chief Complaint  Patient presents with   Right Knee - Pain    04/02/21 RT TKA   Left Knee - Pain    HPI Carrie [name] comes in today with bilateral knee pain.  She states she had a fall 3 weeks ago.  She states her left knee locks at times.  Right knee feels stiff but is overall trending towards  improvement.  Last left knee injection was 02/28/2022.  MRI left knee dated 07/09/2021 showed full-thickness cartilage defect in the weightbearing area the medial compartment.  Lateral compartment with focal cartilage defect fissuring in the weightbearing portion.  Patellofemoral articular cartilage thinning but no full-thickness defects.  History of right total knee arthroplasty 04/02/2021.   Review of Systems  Constitutional:  Negative for chills and fever.     Objective: Vital Signs: There were no vitals taken for this visit.  Physical Exam Constitutional:      Appearance: She is not ill-appearing or diaphoretic.  Neurological:     Mental Status: She is alert and oriented to person, place, and time.  Psychiatric:        Mood and Affect: Mood normal.     Ortho Exam Bilateral knees good range of motion both knees.  No instability valgus varus stressing of either knee.  No abnormal warmth erythema of either knee.  Left knee with slight effusion.  Significant patellofemoral crepitus left knee.  Tenderness medial joint line left knee greater than right.  Specialty Comments:  No specialty comments available.  Imaging: XR Knee 1-2 Views Left  Result Date: 01/09/2023 Left knee 2 views: Knee is well located.  No acute fractures or acute findings.  Moderate to moderately severe narrowing medial joint line.  Patellofemoral joint with moderately severe arthritic changes.  XR Knee 1-2 Views Right  Result Date: 01/09/2023 Right knee 2 views: No acute fracture.  Well-seated arthroplasty components.  No sclerotic.  Knee is well located.    PMFS History: Patient Active Problem List   Diagnosis Date Noted   Encounter for screening colonoscopy 04/04/2022   Esophageal dysphagia 04/04/2022   Constipation 04/04/2022   Arthrofibrosis of right total knee arthroplasty, subsequent encounter 05/20/2021   Pain in right knee 04/19/2021   Stiffness of right knee, not elsewhere classified 04/19/2021    Difficulty in walking, not elsewhere classified 04/19/2021   Status post total knee replacement, right 04/02/2021   Allergic rhinitis due to pollen 06/22/2020   Eosinophilic esophagitis 06/22/2020   Rash and other nonspecific skin eruption 06/22/2020   Unilateral primary osteoarthritis, right knee 10/09/2017   AC joint arthropathy 01/04/2017   Acute pain of right shoulder 11/15/2016   Other derangements of patella, unspecified knee 09/02/2015   Recurrent dislocation of right patella 09/02/2015   Breast hypertrophy in female 05/13/2015   Neuroma of lower extremity 02/11/2011    Class: Chronic   Past Medical History:  Diagnosis Date   Arthritis    Arthrofibrosis of right total knee arthroplasty, subsequent encounter 05/20/2021   Diabetes mellitus without complication (HCC)    Essential hypertension    GERD (gastroesophageal reflux disease)    Mixed hyperlipidemia    PONV (postoperative nausea and vomiting)     Family History  Problem Relation Age of Onset   Breast cancer Mother    Heart disease Father    Prostate cancer Father    Breast cancer Maternal Grandmother    Colon cancer Maternal Grandmother    Colon cancer Maternal Grandfather     Past Surgical History:  Procedure Laterality Date   ABDOMINAL HYSTERECTOMY     BIOPSY  04/25/2022   Procedure: BIOPSY;  Surgeon: Corbin Ade, MD;  [facility];  Service: Endoscopy;;   COLONOSCOPY WITH PROPOFOL N/A 04/25/2022   Procedure: COLONOSCOPY WITH PROPOFOL;  Surgeon: Corbin Ade, MD;  [facility];  Service: Endoscopy;  Laterality: N/A;  8:45 am, asa 2   ESOPHAGOGASTRODUODENOSCOPY (EGD) WITH PROPOFOL N/A 04/25/2022   Procedure: ESOPHAGOGASTRODUODENOSCOPY (EGD) WITH PROPOFOL;  Surgeon: Corbin Ade, MD;  [facility];  Service: Endoscopy;  Laterality: N/A;   HARDWARE REMOVAL Right 04/02/2021   Procedure: Right Knee HARDWARE REMOVAL;  Surgeon: Kathryne Hitch, MD;  Location: [facility];  Service: Orthopedics;  Laterality: Right;   KNEE ARTHROSCOPY  02/15/2007   KNEE CLOSED REDUCTION Right 05/20/2021   Procedure: CLOSED MANIPULATION RIGHT KNEE;  Surgeon: Kathryne Hitch, MD;  Location: [facility];  Service: Orthopedics;  Laterality: Right;   MALONEY DILATION N/A 04/25/2022   Procedure: Elease Hashimoto DILATION;  Surgeon: Corbin Ade, MD;  [facility];  Service: Endoscopy;  Laterality: N/A;   MASS EXCISION  02/11/2011   Procedure: EXCISION MASS;  Surgeon: Kerrin Champagne, MD;  Location: [facility];  Service: Orthopedics;  Laterality: Right;  resection of neuroma right infrapatellar medial knee   PATELLA ARTHROPLASTY     rt knee-   POLYPECTOMY  04/25/2022   Procedure: POLYPECTOMY;  Surgeon: Corbin Ade, MD;  [facility];  Service: Endoscopy;;   REDUCTION MAMMAPLASTY Bilateral 2017   TOTAL KNEE ARTHROPLASTY  02/14/2009   partial-[place] [facility]    TOTAL KNEE ARTHROPLASTY Right 04/02/2021   Procedure: Right  TOTAL KNEE ARTHROPLASTY;  Surgeon: Kathryne Hitch, MD;  Location: [facility];  Service: Orthopedics;  Laterality: Right;   TUBAL LIGATION     Social History   Occupational History   Not on file  Tobacco Use   Smoking status: Never   Smokeless tobacco: Never  Vaping Use   Vaping status: Never Used  Substance and Sexual Activity   Alcohol use: No    Alcohol/week: 0.0 standard drinks of alcohol   Drug use: No   Sexual activity: Yes    Birth control/protection: Surgical    Comment: hyst

## 2023-01-24 DIAGNOSIS — G43719 Chronic migraine without aura, intractable, without status migrainosus: Secondary | ICD-10-CM | POA: Diagnosis not present

## 2023-01-24 DIAGNOSIS — M542 Cervicalgia: Secondary | ICD-10-CM | POA: Diagnosis not present

## 2023-01-28 ENCOUNTER — Other Ambulatory Visit: Payer: Self-pay | Admitting: Gastroenterology

## 2023-02-20 ENCOUNTER — Ambulatory Visit: Payer: BC Managed Care – PPO | Admitting: Physician Assistant

## 2023-03-03 DIAGNOSIS — R946 Abnormal results of thyroid function studies: Secondary | ICD-10-CM | POA: Diagnosis not present

## 2023-03-03 DIAGNOSIS — Z6831 Body mass index (BMI) 31.0-31.9, adult: Secondary | ICD-10-CM | POA: Diagnosis not present

## 2023-03-03 DIAGNOSIS — E6609 Other obesity due to excess calories: Secondary | ICD-10-CM | POA: Diagnosis not present

## 2023-03-03 DIAGNOSIS — E782 Mixed hyperlipidemia: Secondary | ICD-10-CM | POA: Diagnosis not present

## 2023-03-03 DIAGNOSIS — E7849 Other hyperlipidemia: Secondary | ICD-10-CM | POA: Diagnosis not present

## 2023-03-03 DIAGNOSIS — J0100 Acute maxillary sinusitis, unspecified: Secondary | ICD-10-CM | POA: Diagnosis not present

## 2023-03-06 ENCOUNTER — Encounter: Payer: Self-pay | Admitting: Physician Assistant

## 2023-03-06 ENCOUNTER — Ambulatory Visit: Payer: BC Managed Care – PPO | Admitting: Physician Assistant

## 2023-03-06 DIAGNOSIS — M7072 Other bursitis of hip, left hip: Secondary | ICD-10-CM | POA: Diagnosis not present

## 2023-03-06 DIAGNOSIS — M1712 Unilateral primary osteoarthritis, left knee: Secondary | ICD-10-CM | POA: Diagnosis not present

## 2023-03-06 NOTE — Progress Notes (Signed)
  HPI: Carrie [name] returns today follow-up bilateral knees.  She states that her left knee she got no real relief with the injection.  She states her right knee overall is doing well.  She status post right total knee arthroplasty 04/02/2021.  She notes that the left knee pain is just overall getting worse.  She has known osteoarthritis of her knee.  She has tried cortisone injections and viscosupplementation injections without any real relief.  No new injury.  She is diabetic reports her diabetes is under good control.  She also notes that she is having some left buttocks pain that started just this morning.  No new injury.  No numbness tingling.  Pain is lateral aspect hip region.  She has also tried ibuprofen for the knee pain without relief.  Review of systems: Denies any fevers chills ongoing infections.  Physical exam: General Well-developed well-nourished female in no acute distress mood and affect appropriate.  Ambulates with a slight antalgic gait with no assistive device. Psych: Alert and oriented x 3 Respiratory: Unlabored Right knee: Good range of motion without pain surgical incisions well-healed. Left knee: Good range of motion.  Significant patellofemoral crepitus with range of motion.  Tenderness along medial joint line.  No abnormal warmth erythema or effusion Left hip: Good range of motion slight discomfort with external rotation.  Tenderness over the trochanteric region.  Impression: Left knee osteoarthritis Left hip trochanteric bursitis  Plan: IT band stretching exercises as shown.  In regards to her knee she would like to think about undergoing a left total knee arthroplasty.  Again she has had a right total knee arthroplasty understands risk benefits of surgery.  Risk include but are not limited to DVT/PE, blood loss, nerve vessel injury, infection and prolonged pain.  She will follow-up with [place] as needed.  If she decides to undergo left knee surgery she can call the office as long as it  has been within 3 months and we can schedule her as long as there is been no change in her overall health status.

## 2023-03-17 ENCOUNTER — Telehealth: Payer: Self-pay

## 2023-03-17 NOTE — Telephone Encounter (Signed)
I called patient and left voice mail to call me back to discuss scheduling TKA.

## 2023-04-18 ENCOUNTER — Other Ambulatory Visit: Payer: Self-pay

## 2023-04-18 NOTE — Progress Notes (Signed)
 Sent message, via epic in basket, requesting orders in epic from Careers adviser.

## 2023-04-20 NOTE — Progress Notes (Addendum)
 PCP - Terie Purser ,PA [facility] [place], [place] Cardiologist - Nona Dell , MD lov 02-04-21  saw for syncope before right knee replacement no issues since .  PPM/ICD -  Device Orders -  Rep Notified -   Chest x-ray -  EKG -  Stress Test -  ECHO - 2022 epic Cardiac Cath -  HgbA1c 04-25-23 epic 7.0  Sleep Study -  CPAP -   Fasting Blood Sugar - 100-115 Checks Blood Sugar __QOD___  Blood Thinner Instructions: Aspirin Instructions: 81 mg ASA  ERAS Protcol - PRE-SURGERY G2-    COVID vaccine -YES  Activity--Able to climb a flight of stairs with no CP or SOB Anesthesia review: HTN, DM  Patient denies shortness of breath, fever, cough and chest pain at PAT appointment   All instructions explained to the patient, with a verbal understanding of the material. Patient agrees to go over the instructions while at home for a better understanding. Patient also instructed to self quarantine after being tested for COVID-19. The opportunity to ask questions was provided.

## 2023-04-21 NOTE — Patient Instructions (Signed)
 SURGICAL WAITING ROOM VISITATION  Patients having surgery or a procedure may have no more than 2 support people in the waiting area - these visitors may rotate.    Children under the age of 22 must have an adult with them who is not the patient.  Due to an increase in RSV and influenza rates and associated hospitalizations, children ages 26 and under may not visit patients in [facility] hospitals.  Visitors with respiratory illnesses are discouraged from visiting and should remain at home.  If the patient needs to stay at the hospital during part of their recovery, the visitor guidelines for inpatient rooms apply. Pre-op nurse will coordinate an appropriate time for 1 support person to accompany patient in pre-op.  This support person may not rotate.    Please refer to the [facility] website for the visitor guidelines for Inpatients (after your surgery is over and you are in a regular room).       Your procedure is scheduled on: 05-05-23   Report to [facility] Main Entrance    Report to admitting at     1130 AM   Call this number if you have problems the morning of surgery [phone]   Do not eat food :After Midnight.   After Midnight you may have the following liquids until 11:00 ______ AM/ PM DAY OF SURGERY  then nothing by mouth  Water Non-Citrus Juices (without pulp, NO RED-Apple, White grape, White cranberry) Black Coffee (NO MILK/CREAM OR CREAMERS, sugar ok)  Clear Tea (NO MILK/CREAM OR CREAMERS, sugar ok) regular and decaf                             Plain Jell-O (NO RED)                                           Fruit ices (not with fruit pulp, NO RED)                                     Popsicles (NO RED)                                                               Sports drinks like Gatorade (NO RED)                     The day of surgery:  Drink ONE (1) Pre-Surgery G2 BY 11:00 AM the morning of surgery. Drink in one sitting. Do not sip.  This drink  was given to you during your hospital  pre-op appointment visit. Nothing else to drink after completing the  Pre-Surgery  G2.          If you have questions, please contact your surgeon's office.   FOLLOW  ANY ADDITIONAL PRE OP INSTRUCTIONS YOU RECEIVED FROM YOUR SURGEON'S OFFICE!!!     Oral Hygiene is also important to reduce your risk of infection.  Remember - BRUSH YOUR TEETH THE MORNING OF SURGERY WITH YOUR REGULAR TOOTHPASTE  DENTURES WILL BE REMOVED PRIOR TO SURGERY PLEASE DO NOT APPLY "Poly grip" OR ADHESIVES!!!   Do NOT smoke after Midnight   Stop all vitamins and herbal supplements 7 days before surgery.   Take these medicines the morning of surgery with A SIP OF WATER: pantoprazole, levothyroxine  DO NOT TAKE ANY ORAL DIABETIC MEDICATIONS DAY OF YOUR SURGERY                                You may not have any metal on your body including hair pins, jewelry, and body piercing             Do not wear make-up, lotions, powders, perfumes/cologne, or deodorant  Do not wear nail polish including gel and S&S, artificial/acrylic nails, or any other type of covering on natural nails including finger and toenails. If you have artificial nails, gel coating, etc. that needs to be removed by a nail salon please have this removed prior to surgery or surgery may need to be canceled/ delayed if the surgeon/ anesthesia feels like they are unable to be safely monitored.   Do not shave  5 days prior to surgery.               Do not bring valuables to the hospital. [facility] IS NOT             RESPONSIBLE   FOR VALUABLES.   Contacts, glasses, dentures or bridgework may not be worn into surgery.   Bring small overnight bag day of surgery.   DO NOT BRING YOUR HOME MEDICATIONS TO THE HOSPITAL. PHARMACY WILL DISPENSE MEDICATIONS LISTED ON YOUR MEDICATION LIST TO YOU DURING YOUR ADMISSION IN THE HOSPITAL!    Patients discharged on the day of surgery  will not be allowed to drive home.  Someone NEEDS to stay with you for the first 24 hours after anesthesia.   Special Instructions: Bring a copy of your healthcare power of attorney and living will documents the day of surgery if you haven't scanned them before.              Please read over the following fact sheets you were given: IF YOU HAVE QUESTIONS ABOUT YOUR PRE-OP INSTRUCTIONS PLEASE CALL [phone]    If you test positive for Covid or have been in contact with anyone that has tested positive in the last 10 days please notify you surgeon.      Pre-operative 5 CHG Bath Instructions   You can play a key role in reducing the risk of infection after surgery. Your skin needs to be as free of germs as possible. You can reduce the number of germs on your skin by washing with CHG (chlorhexidine gluconate) soap before surgery. CHG is an antiseptic soap that kills germs and continues to kill germs even after washing.   DO NOT use if you have an allergy to chlorhexidine/CHG or antibacterial soaps. If your skin becomes reddened or irritated, stop using the CHG and notify one of our RNs at [phone].   Please shower with the CHG soap starting 4 days before surgery using the following schedule:     Please keep in mind the following:  DO NOT shave, including legs and underarms, starting the day of your first shower.   You may shave your face at any point before/day of surgery.  Place clean sheets on your bed the day you start using CHG soap. Use a clean washcloth (not used since being washed) for each shower. DO NOT sleep with pets once you start using the CHG.   CHG Shower Instructions:  If you choose to wash your hair and private area, wash first with your normal shampoo/soap.  After you use shampoo/soap, rinse your hair and body thoroughly to remove shampoo/soap residue.  Turn the water OFF and apply about 3 tablespoons (45 ml) of CHG soap to a CLEAN washcloth.  Apply CHG soap ONLY  FROM YOUR NECK DOWN TO YOUR TOES (washing for 3-5 minutes)  DO NOT use CHG soap on face, private areas, open wounds, or sores.  Pay special attention to the area where your surgery is being performed.  If you are having back surgery, having someone wash your back for you may be helpful. Wait 2 minutes after CHG soap is applied, then you may rinse off the CHG soap.  Pat dry with a clean towel  Put on clean clothes/pajamas   If you choose to wear lotion, please use ONLY the CHG-compatible lotions on the back of this paper.     Additional instructions for the day of surgery: DO NOT APPLY any lotions, deodorants, cologne, or perfumes.   Put on clean/comfortable clothes.  Brush your teeth.  Ask your nurse before applying any prescription medications to the skin.      CHG Compatible Lotions   Aveeno Moisturizing lotion  Cetaphil Moisturizing Cream  Cetaphil Moisturizing Lotion  Clairol Herbal Essence Moisturizing Lotion, Dry Skin  Clairol Herbal Essence Moisturizing Lotion, Extra Dry Skin  Clairol Herbal Essence Moisturizing Lotion, Normal Skin  Curel Age Defying Therapeutic Moisturizing Lotion with Alpha Hydroxy  Curel Extreme Care Body Lotion  Curel Soothing Hands Moisturizing Hand Lotion  Curel Therapeutic Moisturizing Cream, Fragrance-Free  Curel Therapeutic Moisturizing Lotion, Fragrance-Free  Curel Therapeutic Moisturizing Lotion, Original Formula  Eucerin Daily Replenishing Lotion  Eucerin Dry Skin Therapy Plus Alpha Hydroxy Crme  Eucerin Dry Skin Therapy Plus Alpha Hydroxy Lotion  Eucerin Original Crme  Eucerin Original Lotion  Eucerin Plus Crme Eucerin Plus Lotion  Eucerin TriLipid Replenishing Lotion  Keri Anti-Bacterial Hand Lotion  Keri Deep Conditioning Original Lotion Dry Skin Formula Softly Scented  Keri Deep Conditioning Original Lotion, Fragrance Free Sensitive Skin Formula  Keri Lotion Fast Absorbing Fragrance Free Sensitive Skin Formula  Keri Lotion Fast  Absorbing Softly Scented Dry Skin Formula  Keri Original Lotion  Keri Skin Renewal Lotion Keri Silky Smooth Lotion  Keri Silky Smooth Sensitive Skin Lotion  Nivea Body Creamy Conditioning Oil  Nivea Body Extra Enriched Lotion  Nivea Body Original Lotion  Nivea Body Sheer Moisturizing Lotion Nivea Crme  Nivea Skin Firming Lotion  NutraDerm 30 Skin Lotion  NutraDerm Skin Lotion  NutraDerm Therapeutic Skin Cream  NutraDerm Therapeutic Skin Lotion  ProShield Protective Hand Cream  Provon moisturizing lotion    Incentive Spirometer  An incentive spirometer is a tool that can help keep your lungs clear and active. This tool measures how well you are filling your lungs with each breath. Taking long deep breaths may help reverse or decrease the chance of developing breathing (pulmonary) problems (especially infection) following: A long period of time when you are unable to move or be active. BEFORE THE PROCEDURE  If the spirometer includes an indicator to show your best effort, your nurse or respiratory therapist will set it to a desired goal. If possible, sit up straight or  lean slightly forward. Try not to slouch. Hold the incentive spirometer in an upright position. INSTRUCTIONS FOR USE  Sit on the edge of your bed if possible, or sit up as far as you can in bed or on a chair. Hold the incentive spirometer in an upright position. Breathe out normally. Place the mouthpiece in your mouth and seal your lips tightly around it. Breathe in slowly and as deeply as possible, raising the piston or the ball toward the top of the column. Hold your breath for 3-5 seconds or for as long as possible. Allow the piston or ball to fall to the bottom of the column. Remove the mouthpiece from your mouth and breathe out normally. Rest for a few seconds and repeat Steps 1 through 7 at least 10 times every 1-2 hours when you are awake. Take your time and take a few normal breaths between deep breaths. The  spirometer may include an indicator to show your best effort. Use the indicator as a goal to work toward during each repetition. After each set of 10 deep breaths, practice coughing to be sure your lungs are clear. If you have an incision (the cut made at the time of surgery), support your incision when coughing by placing a pillow or rolled up towels firmly against it. Once you are able to get out of bed, walk around indoors and cough well. You may stop using the incentive spirometer when instructed by your caregiver.  RISKS AND COMPLICATIONS Take your time so you do not get dizzy or light-headed. If you are in pain, you may need to take or ask for pain medication before doing incentive spirometry. It is harder to take a deep breath if you are having pain. AFTER USE Rest and breathe slowly and easily. It can be helpful to keep track of a log of your progress. Your caregiver can provide you with a simple table to help with this. If you are using the spirometer at home, follow these instructions: SEEK MEDICAL CARE IF:  You are having difficultly using the spirometer. You have trouble using the spirometer as often as instructed. Your pain medication is not giving enough relief while using the spirometer. You develop fever of 100.5 F (38.1 C) or higher. SEEK IMMEDIATE MEDICAL CARE IF:  You cough up bloody sputum that had not been present before. You develop fever of 102 F (38.9 C) or greater. You develop worsening pain at or near the incision site. MAKE SURE YOU:  Understand these instructions. Will watch your condition. Will get help right away if you are not doing well or get worse. Document Released: 06/13/2006 Document Revised: 04/25/2011 Document Reviewed: 08/14/2006 [facility] Patient Information 2014 [place], [place].   ________________________________________________________________________

## 2023-04-25 ENCOUNTER — Encounter (HOSPITAL_COMMUNITY)
Admission: RE | Admit: 2023-04-25 | Discharge: 2023-04-25 | Disposition: A | Discharge status: Home / Self Care | Source: Ambulatory Visit | Attending: Orthopaedic Surgery | Admitting: Orthopaedic Surgery

## 2023-04-25 ENCOUNTER — Other Ambulatory Visit: Payer: Self-pay

## 2023-04-25 ENCOUNTER — Encounter (HOSPITAL_COMMUNITY): Payer: Self-pay

## 2023-04-25 VITALS — BP 149/91 | HR 82 | Temp 98.3°F | Resp 16 | Ht 59.0 in | Wt 160.0 lb

## 2023-04-25 DIAGNOSIS — Z01818 Encounter for other preprocedural examination: Secondary | ICD-10-CM | POA: Insufficient documentation

## 2023-04-25 DIAGNOSIS — M1712 Unilateral primary osteoarthritis, left knee: Secondary | ICD-10-CM | POA: Diagnosis not present

## 2023-04-25 DIAGNOSIS — E119 Type 2 diabetes mellitus without complications: Secondary | ICD-10-CM | POA: Diagnosis not present

## 2023-04-25 HISTORY — DX: Headache, unspecified: R51.9

## 2023-04-25 HISTORY — DX: Hypothyroidism, unspecified: E03.9

## 2023-04-25 LAB — BASIC METABOLIC PANEL
Anion gap: 15 (ref 5–15)
BUN: 15 mg/dL (ref 6–20)
CO2: 25 mmol/L (ref 22–32)
Calcium: 8.9 mg/dL (ref 8.9–10.3)
Chloride: 99 mmol/L (ref 98–111)
Creatinine, Ser: 0.68 mg/dL (ref 0.44–1.00)
GFR, Estimated: >60 mL/min (ref >60)
Glucose, Bld: 107 mg/dL — ABNORMAL HIGH (ref 70–99)
Potassium: 4.3 mmol/L (ref 3.5–5.1)
Sodium: 139 mmol/L (ref 135–145)

## 2023-04-25 LAB — CBC
HCT: 39.8 % (ref 36.0–46.0)
Hemoglobin: 13.1 g/dL (ref 12.0–15.0)
MCH: 31.2 pg (ref 26.0–34.0)
MCHC: 32.9 g/dL (ref 30.0–36.0)
MCV: 94.8 fL (ref 80.0–100.0)
Platelets: 311 10*3/uL (ref 150–400)
RBC: 4.2 MIL/uL (ref 3.87–5.11)
RDW: 14.5 % (ref 11.5–15.5)
WBC: 8.7 10*3/uL (ref 4.0–10.5)
nRBC: 0 % (ref 0.0–0.2)

## 2023-04-25 LAB — HEMOGLOBIN A1C
Hgb A1c MFr Bld: 7 % — ABNORMAL HIGH (ref 4.8–5.6)
Mean Plasma Glucose: 154.2 mg/dL

## 2023-04-25 LAB — SURGICAL PCR SCREEN
MRSA, PCR: NEGATIVE
Staphylococcus aureus: POSITIVE — AB

## 2023-04-25 LAB — GLUCOSE, CAPILLARY: Glucose-Capillary: 115 mg/dL — ABNORMAL HIGH (ref 70–99)

## 2023-05-04 DIAGNOSIS — M1712 Unilateral primary osteoarthritis, left knee: Secondary | ICD-10-CM | POA: Insufficient documentation

## 2023-05-04 NOTE — H&P (Signed)
 TOTAL KNEE ADMISSION H&P  Patient is being admitted for left total knee arthroplasty.  Subjective:  Chief Complaint:left knee pain.  HPI: Carrie [name], 53 y.o. female, has a history of pain and functional disability in the left knee due to arthritis and has failed non-surgical conservative treatments for greater than 12 weeks to includeNSAID's and/or analgesics, corticosteriod injections, viscosupplementation injections, flexibility and strengthening excercises, and activity modification.  Onset of symptoms was gradual, starting several years ago with gradually worsening course since that time. The patient noted no past surgery on the left knee(s).  Patient currently rates pain in the left knee(s) at 10 out of 10 with activity. Patient has night pain, worsening of pain with activity and weight bearing, pain that interferes with activities of daily living, pain with passive range of motion, crepitus, and joint swelling.  Patient has evidence of subchondral sclerosis, periarticular osteophytes, and joint space narrowing by imaging studies. There is no active infection.  Patient Active Problem List   Diagnosis Date Noted   Unilateral primary osteoarthritis, left knee 05/04/2023   Encounter for screening colonoscopy 04/04/2022   Esophageal dysphagia 04/04/2022   Constipation 04/04/2022   Arthrofibrosis of right total knee arthroplasty, subsequent encounter 05/20/2021   Pain in right knee 04/19/2021   Stiffness of right knee, not elsewhere classified 04/19/2021   Difficulty in walking, not elsewhere classified 04/19/2021   Status post total knee replacement, right 04/02/2021   Allergic rhinitis due to pollen 06/22/2020   Eosinophilic esophagitis 06/22/2020   Rash and other nonspecific skin eruption 06/22/2020   AC joint arthropathy 01/04/2017   Acute pain of right shoulder 11/15/2016   Other derangements of patella, unspecified knee 09/02/2015   Recurrent dislocation of right patella  09/02/2015   Breast hypertrophy in female 05/13/2015   Neuroma of lower extremity 02/11/2011   Past Medical History:  Diagnosis Date   Arthritis    Arthrofibrosis of right total knee arthroplasty, subsequent encounter 05/20/2021   Diabetes mellitus without complication (HCC)    type 2   Essential hypertension    GERD (gastroesophageal reflux disease)    Headache    migraine   Hypothyroidism    Mixed hyperlipidemia    PONV (postoperative nausea and vomiting)    Really Sick!!! Had to stay in the hospital    Past Surgical History:  Procedure Laterality Date   ABDOMINAL HYSTERECTOMY     BIOPSY  04/25/2022   Procedure: BIOPSY;  Surgeon: Corbin Ade, MD;  Location: [facility];  Service: Endoscopy;;   COLONOSCOPY WITH PROPOFOL N/A 04/25/2022   Procedure: COLONOSCOPY WITH PROPOFOL;  Surgeon: Corbin Ade, MD;  Location: [facility];  Service: Endoscopy;  Laterality: N/A;  8:45 am, asa 2   ESOPHAGOGASTRODUODENOSCOPY (EGD) WITH PROPOFOL N/A 04/25/2022   Procedure: ESOPHAGOGASTRODUODENOSCOPY (EGD) WITH PROPOFOL;  Surgeon: Corbin Ade, MD;  Location: [facility];  Service: Endoscopy;  Laterality: N/A;   HARDWARE REMOVAL Right 04/02/2021   Procedure: Right Knee HARDWARE REMOVAL;  Surgeon: Kathryne Hitch, MD;  Location: [facility];  Service: Orthopedics;  Laterality: Right;   KNEE ARTHROSCOPY  02/15/2007   KNEE CLOSED REDUCTION Right 05/20/2021   Procedure: CLOSED MANIPULATION RIGHT KNEE;  Surgeon: Kathryne Hitch, MD;  Location: [facility];  Service: Orthopedics;  Laterality: Right;   MALONEY DILATION N/A 04/25/2022   Procedure: Elease Hashimoto DILATION;  Surgeon: Corbin Ade, MD;  Location: [facility];  Service: Endoscopy;  Laterality: N/A;   MASS EXCISION  02/11/2011  Procedure: EXCISION MASS;  Surgeon: Kerrin Champagne, MD;  Location: [facility];  Service: Orthopedics;  Laterality: Right;  resection of neuroma right infrapatellar  medial knee   PATELLA ARTHROPLASTY     rt knee-   POLYPECTOMY  04/25/2022   Procedure: POLYPECTOMY;  Surgeon: Corbin Ade, MD;  Location: [facility];  Service: Endoscopy;;   REDUCTION MAMMAPLASTY Bilateral 2017   TOTAL KNEE ARTHROPLASTY  02/14/2009   partial-[place] reg    TOTAL KNEE ARTHROPLASTY Right 04/02/2021   Procedure: Right TOTAL KNEE ARTHROPLASTY;  Surgeon: Kathryne Hitch, MD;  Location: [facility];  Service: Orthopedics;  Laterality: Right;   TUBAL LIGATION      No current facility-administered medications for this encounter.   Current Outpatient Medications  Medication Sig Dispense Refill Last Dose/Taking   aspirin EC 81 MG tablet Take 81 mg by mouth daily. Swallow whole.   Taking   atorvastatin (LIPITOR) 10 MG tablet Take 10 mg by mouth at bedtime.   Taking   estradiol (CLIMARA - DOSED IN MG/24 HR) 0.1 mg/24hr patch Place 0.1 mg onto the skin every Sunday.   Taking   furosemide (LASIX) 20 MG tablet Take 20 mg by mouth daily.   Taking   ibuprofen (ADVIL) 200 MG tablet Take 800 mg by mouth every 6 (six) hours as needed for moderate pain or headache.   Taking As Needed   levothyroxine (SYNTHROID) 25 MCG tablet Take 25 mcg by mouth daily before breakfast.   Taking   losartan (COZAAR) 100 MG tablet Take 100 mg by mouth daily.   Taking   metFORMIN (GLUCOPHAGE) 1000 MG tablet Take 1,000 mg by mouth 2 (two) times daily with a meal.   Taking   pantoprazole (PROTONIX) 40 MG tablet TAKE 1 TABLET BY MOUTH DAILY BEFORE BREAKFAST 90 tablet 3 Taking   Allergies  Allergen Reactions   Codeine Other (See Comments)    Severe headaches     Social History   Tobacco Use   Smoking status: Never   Smokeless tobacco: Never  Substance Use Topics   Alcohol use: No    Alcohol/week: 0.0 standard drinks of alcohol    Family History  Problem Relation Age of Onset   Breast cancer Mother    Heart disease Father    Prostate cancer Father    Breast cancer Maternal Grandmother     Colon cancer Maternal Grandmother    Colon cancer Maternal Grandfather      Review of Systems  Objective:  Physical Exam Vitals reviewed.  Constitutional:      Appearance: Normal appearance. She is normal weight.  HENT:     Head: Normocephalic and atraumatic.  Eyes:     Extraocular Movements: Extraocular movements intact.     Pupils: Pupils are equal, round, and reactive to light.  Cardiovascular:     Rate and Rhythm: Normal rate and regular rhythm.  Pulmonary:     Effort: Pulmonary effort is normal.     Breath sounds: Normal breath sounds.  Abdominal:     Palpations: Abdomen is soft.  Musculoskeletal:     Cervical back: Normal range of motion and neck supple.     Left knee: Effusion, bony tenderness and crepitus present. Decreased range of motion. Tenderness present over the medial joint line and lateral joint line.  Neurological:     Mental Status: She is alert and oriented to person, place, and time.  Psychiatric:        Behavior: Behavior normal.  Vital signs in last 24 hours:    Labs:   Estimated body mass index is 32.32 kg/m as calculated from the following:   Height as of 04/25/23: 4\' 11"  (1.499 m).   Weight as of 04/25/23: 72.6 kg.   Imaging Review Plain radiographs demonstrate severe degenerative joint disease of the left knee(s). The overall alignment isneutral. The bone quality appears to be good for age and reported activity level.      Assessment/Plan:  End stage arthritis, left knee   The patient history, physical examination, clinical judgment of the provider and imaging studies are consistent with end stage degenerative joint disease of the left knee(s) and total knee arthroplasty is deemed medically necessary. The treatment options including medical management, injection therapy arthroscopy and arthroplasty were discussed at length. The risks and benefits of total knee arthroplasty were presented and reviewed. The risks due to aseptic  loosening, infection, stiffness, patella tracking problems, thromboembolic complications and other imponderables were discussed. The patient acknowledged the explanation, agreed to proceed with the plan and consent was signed. Patient is being admitted for inpatient treatment for surgery, pain control, PT, OT, prophylactic antibiotics, VTE prophylaxis, progressive ambulation and ADL's and discharge planning. The patient is planning to be discharged home with home health services

## 2023-05-05 ENCOUNTER — Encounter (HOSPITAL_COMMUNITY)
Admission: RE | Disposition: A | Discharge status: Home Health | Payer: Self-pay | Source: Ambulatory Visit | Attending: Orthopaedic Surgery

## 2023-05-05 ENCOUNTER — Other Ambulatory Visit: Payer: Self-pay

## 2023-05-05 ENCOUNTER — Observation Stay (HOSPITAL_COMMUNITY)
Admission: RE | Admit: 2023-05-05 | Discharge: 2023-05-08 | Disposition: A | Discharge status: Home Health | Payer: BC Managed Care – PPO | Source: Ambulatory Visit | Attending: Orthopaedic Surgery | Admitting: Orthopaedic Surgery

## 2023-05-05 ENCOUNTER — Observation Stay (HOSPITAL_COMMUNITY)

## 2023-05-05 ENCOUNTER — Encounter (HOSPITAL_COMMUNITY): Payer: Self-pay | Admitting: Orthopaedic Surgery

## 2023-05-05 ENCOUNTER — Ambulatory Visit (HOSPITAL_COMMUNITY): Admitting: Certified Registered"

## 2023-05-05 DIAGNOSIS — E119 Type 2 diabetes mellitus without complications: Secondary | ICD-10-CM | POA: Insufficient documentation

## 2023-05-05 DIAGNOSIS — Z7984 Long term (current) use of oral hypoglycemic drugs: Secondary | ICD-10-CM | POA: Insufficient documentation

## 2023-05-05 DIAGNOSIS — Z96652 Presence of left artificial knee joint: Secondary | ICD-10-CM | POA: Diagnosis not present

## 2023-05-05 DIAGNOSIS — I10 Essential (primary) hypertension: Secondary | ICD-10-CM | POA: Diagnosis not present

## 2023-05-05 DIAGNOSIS — E039 Hypothyroidism, unspecified: Secondary | ICD-10-CM | POA: Insufficient documentation

## 2023-05-05 DIAGNOSIS — Z96651 Presence of right artificial knee joint: Secondary | ICD-10-CM | POA: Insufficient documentation

## 2023-05-05 DIAGNOSIS — M1712 Unilateral primary osteoarthritis, left knee: Secondary | ICD-10-CM | POA: Diagnosis not present

## 2023-05-05 DIAGNOSIS — Z7982 Long term (current) use of aspirin: Secondary | ICD-10-CM | POA: Diagnosis not present

## 2023-05-05 DIAGNOSIS — Z79899 Other long term (current) drug therapy: Secondary | ICD-10-CM | POA: Insufficient documentation

## 2023-05-05 DIAGNOSIS — Z471 Aftercare following joint replacement surgery: Secondary | ICD-10-CM | POA: Diagnosis not present

## 2023-05-05 DIAGNOSIS — G8918 Other acute postprocedural pain: Secondary | ICD-10-CM | POA: Diagnosis not present

## 2023-05-05 HISTORY — PX: TOTAL KNEE ARTHROPLASTY: SHX125

## 2023-05-05 LAB — GLUCOSE, CAPILLARY
Glucose-Capillary: 102 mg/dL — ABNORMAL HIGH (ref 70–99)
Glucose-Capillary: 105 mg/dL — ABNORMAL HIGH (ref 70–99)
Glucose-Capillary: 142 mg/dL — ABNORMAL HIGH (ref 70–99)

## 2023-05-05 SURGERY — ARTHROPLASTY, KNEE, TOTAL
Anesthesia: Regional | Site: Knee | Laterality: Left

## 2023-05-05 MED ORDER — ACETAMINOPHEN 10 MG/ML IV SOLN
1000.0000 mg | Freq: Once | INTRAVENOUS | Status: DC | PRN
Start: 2023-05-05 — End: 2023-05-05

## 2023-05-05 MED ORDER — CEFAZOLIN SODIUM-DEXTROSE 2-4 GM/100ML-% IV SOLN
2.0000 g | Freq: Four times a day (QID) | INTRAVENOUS | Status: AC
  Administered 2023-05-05 – 2023-05-06 (×2): 2 g via INTRAVENOUS
  Filled 2023-05-05 (×2): qty 100

## 2023-05-05 MED ORDER — MIDAZOLAM HCL 2 MG/2ML IJ SOLN
1.0000 mg | Freq: Once | INTRAMUSCULAR | Status: AC
  Administered 2023-05-05: 2 mg via INTRAVENOUS
  Filled 2023-05-05: qty 2

## 2023-05-05 MED ORDER — ONDANSETRON HCL 4 MG/2ML IJ SOLN
INTRAMUSCULAR | Status: DC | PRN
  Administered 2023-05-05: 4 mg via INTRAVENOUS

## 2023-05-05 MED ORDER — BUPIVACAINE-EPINEPHRINE 0.25% -1:200000 IJ SOLN
INTRAMUSCULAR | Status: DC | PRN
  Administered 2023-05-05: 30 mL

## 2023-05-05 MED ORDER — METOCLOPRAMIDE HCL 5 MG/ML IJ SOLN: 5.0000 mg | Freq: Three times a day (TID) | INTRAMUSCULAR | Status: DC | PRN

## 2023-05-05 MED ORDER — INSULIN ASPART 100 UNIT/ML IJ SOLN: 0.0000 [IU] | INTRAMUSCULAR | Status: DC | PRN

## 2023-05-05 MED ORDER — ACETAMINOPHEN 325 MG PO TABS
325.0000 mg | ORAL_TABLET | Freq: Four times a day (QID) | ORAL | Status: DC | PRN
  Administered 2023-05-06 – 2023-05-07 (×2): 650 mg via ORAL
  Filled 2023-05-05 (×2): qty 2

## 2023-05-05 MED ORDER — PROPOFOL 10 MG/ML IV BOLUS
INTRAVENOUS | Status: AC
  Filled 2023-05-05: qty 20

## 2023-05-05 MED ORDER — PROPOFOL 10 MG/ML IV BOLUS
INTRAVENOUS | Status: DC | PRN
  Administered 2023-05-05 (×3): 10 mg via INTRAVENOUS
  Administered 2023-05-05: 80 mg via INTRAVENOUS

## 2023-05-05 MED ORDER — LACTATED RINGERS IV SOLN: INTRAVENOUS | Status: DC | PRN

## 2023-05-05 MED ORDER — ASPIRIN 81 MG PO CHEW
81.0000 mg | CHEWABLE_TABLET | Freq: Two times a day (BID) | ORAL | Status: DC
  Administered 2023-05-05 – 2023-05-08 (×6): 81 mg via ORAL
  Filled 2023-05-05 (×6): qty 1

## 2023-05-05 MED ORDER — OXYCODONE HCL 5 MG PO TABS
10.0000 mg | ORAL_TABLET | ORAL | Status: DC | PRN
  Administered 2023-05-06 (×2): 15 mg via ORAL
  Administered 2023-05-06: 10 mg via ORAL
  Administered 2023-05-07 – 2023-05-08 (×4): 15 mg via ORAL
  Filled 2023-05-05 (×4): qty 3
  Filled 2023-05-05: qty 2
  Filled 2023-05-05 (×2): qty 3

## 2023-05-05 MED ORDER — METOCLOPRAMIDE HCL 5 MG PO TABS
5.0000 mg | ORAL_TABLET | Freq: Three times a day (TID) | ORAL | Status: DC | PRN
  Administered 2023-05-07: 10 mg via ORAL
  Filled 2023-05-05: qty 2

## 2023-05-05 MED ORDER — PANTOPRAZOLE SODIUM 40 MG PO TBEC
40.0000 mg | DELAYED_RELEASE_TABLET | Freq: Every day | ORAL | Status: DC
  Administered 2023-05-06 – 2023-05-08 (×3): 40 mg via ORAL
  Filled 2023-05-05 (×3): qty 1

## 2023-05-05 MED ORDER — SODIUM CHLORIDE 0.9 % IR SOLN
Status: DC | PRN
  Administered 2023-05-05: 1000 mL

## 2023-05-05 MED ORDER — POLYETHYLENE GLYCOL 3350 17 G PO PACK: 17.0000 g | PACK | Freq: Every day | ORAL | Status: DC | PRN

## 2023-05-05 MED ORDER — DOCUSATE SODIUM 100 MG PO CAPS
100.0000 mg | ORAL_CAPSULE | Freq: Two times a day (BID) | ORAL | Status: DC
  Administered 2023-05-05 – 2023-05-08 (×6): 100 mg via ORAL
  Filled 2023-05-05 (×6): qty 1

## 2023-05-05 MED ORDER — TIZANIDINE HCL 4 MG PO TABS
4.0000 mg | ORAL_TABLET | Freq: Four times a day (QID) | ORAL | Status: DC | PRN
  Administered 2023-05-05 – 2023-05-07 (×4): 4 mg via ORAL
  Filled 2023-05-05 (×4): qty 1

## 2023-05-05 MED ORDER — LEVOTHYROXINE SODIUM 25 MCG PO TABS
25.0000 ug | ORAL_TABLET | Freq: Every day | ORAL | Status: DC
Start: 2023-05-06 — End: 2023-05-08
  Administered 2023-05-06 – 2023-05-08 (×3): 25 ug via ORAL
  Filled 2023-05-05 (×3): qty 1

## 2023-05-05 MED ORDER — LOSARTAN POTASSIUM 50 MG PO TABS
100.0000 mg | ORAL_TABLET | Freq: Every day | ORAL | Status: DC
  Administered 2023-05-05 – 2023-05-08 (×3): 100 mg via ORAL
  Filled 2023-05-05 (×4): qty 2

## 2023-05-05 MED ORDER — GABAPENTIN 100 MG PO CAPS
100.0000 mg | ORAL_CAPSULE | Freq: Three times a day (TID) | ORAL | Status: DC
  Administered 2023-05-05 – 2023-05-08 (×7): 100 mg via ORAL
  Filled 2023-05-05 (×8): qty 1

## 2023-05-05 MED ORDER — FUROSEMIDE 20 MG PO TABS
20.0000 mg | ORAL_TABLET | Freq: Every day | ORAL | Status: DC
  Administered 2023-05-05: 20 mg via ORAL
  Filled 2023-05-05 (×2): qty 1

## 2023-05-05 MED ORDER — KETOROLAC TROMETHAMINE 15 MG/ML IJ SOLN
7.5000 mg | Freq: Four times a day (QID) | INTRAMUSCULAR | Status: AC
  Administered 2023-05-05 – 2023-05-06 (×3): 7.5 mg via INTRAVENOUS
  Filled 2023-05-05 (×2): qty 1

## 2023-05-05 MED ORDER — FENTANYL CITRATE PF 50 MCG/ML IJ SOSY
50.0000 ug | PREFILLED_SYRINGE | Freq: Once | INTRAMUSCULAR | Status: DC
  Filled 2023-05-05: qty 2

## 2023-05-05 MED ORDER — CEFAZOLIN SODIUM-DEXTROSE 2-4 GM/100ML-% IV SOLN
2.0000 g | INTRAVENOUS | Status: AC
  Administered 2023-05-05: 2 g via INTRAVENOUS
  Filled 2023-05-05: qty 100

## 2023-05-05 MED ORDER — SODIUM CHLORIDE 0.9 % IV SOLN: INTRAVENOUS | Status: AC

## 2023-05-05 MED ORDER — AMISULPRIDE (ANTIEMETIC) 5 MG/2ML IV SOLN
10.0000 mg | Freq: Once | INTRAVENOUS | Status: DC | PRN
Start: 2023-05-05 — End: 2023-05-05

## 2023-05-05 MED ORDER — PHENYLEPHRINE HCL-NACL 20-0.9 MG/250ML-% IV SOLN
INTRAVENOUS | Status: DC | PRN
  Administered 2023-05-05: 25 ug/min via INTRAVENOUS

## 2023-05-05 MED ORDER — 0.9 % SODIUM CHLORIDE (POUR BTL) OPTIME
TOPICAL | Status: DC | PRN
  Administered 2023-05-05: 1000 mL

## 2023-05-05 MED ORDER — CEFAZOLIN SODIUM-DEXTROSE 2-3 GM-%(50ML) IV SOLR: INTRAVENOUS | Status: DC | PRN

## 2023-05-05 MED ORDER — BUPIVACAINE IN DEXTROSE 0.75-8.25 % IT SOLN
INTRATHECAL | Status: DC | PRN
  Administered 2023-05-05: 1.4 mL via INTRATHECAL

## 2023-05-05 MED ORDER — ORAL CARE MOUTH RINSE: 15.0000 mL | Freq: Once | OROMUCOSAL | Status: AC

## 2023-05-05 MED ORDER — CHLORHEXIDINE GLUCONATE 0.12 % MT SOLN
15.0000 mL | Freq: Once | OROMUCOSAL | Status: AC
  Administered 2023-05-05: 15 mL via OROMUCOSAL

## 2023-05-05 MED ORDER — PHENOL 1.4 % MT LIQD: 1.0000 | OROMUCOSAL | Status: DC | PRN

## 2023-05-05 MED ORDER — HYDROMORPHONE HCL 1 MG/ML IJ SOLN: 0.5000 mg | INTRAMUSCULAR | Status: DC | PRN

## 2023-05-05 MED ORDER — DEXAMETHASONE SODIUM PHOSPHATE 10 MG/ML IJ SOLN
INTRAMUSCULAR | Status: DC | PRN
  Administered 2023-05-05: 5 mg via INTRAVENOUS

## 2023-05-05 MED ORDER — DEXAMETHASONE SODIUM PHOSPHATE 10 MG/ML IJ SOLN
INTRAMUSCULAR | Status: AC
  Filled 2023-05-05: qty 1

## 2023-05-05 MED ORDER — METFORMIN HCL 500 MG PO TABS
1000.0000 mg | ORAL_TABLET | Freq: Two times a day (BID) | ORAL | Status: DC
  Administered 2023-05-06 – 2023-05-08 (×5): 1000 mg via ORAL
  Filled 2023-05-05 (×5): qty 2

## 2023-05-05 MED ORDER — PROPOFOL 500 MG/50ML IV EMUL
INTRAVENOUS | Status: DC | PRN
  Administered 2023-05-05: 100 ug/kg/min via INTRAVENOUS

## 2023-05-05 MED ORDER — MENTHOL 3 MG MT LOZG: 1.0000 | LOZENGE | OROMUCOSAL | Status: DC | PRN

## 2023-05-05 MED ORDER — BUPIVACAINE-EPINEPHRINE (PF) 0.25% -1:200000 IJ SOLN
INTRAMUSCULAR | Status: AC
  Filled 2023-05-05: qty 30

## 2023-05-05 MED ORDER — ONDANSETRON HCL 4 MG/2ML IJ SOLN
4.0000 mg | Freq: Four times a day (QID) | INTRAMUSCULAR | Status: DC | PRN
  Administered 2023-05-07: 4 mg via INTRAVENOUS
  Filled 2023-05-05: qty 2

## 2023-05-05 MED ORDER — DIPHENHYDRAMINE HCL 12.5 MG/5ML PO ELIX: 12.5000 mg | ORAL_SOLUTION | ORAL | Status: DC | PRN

## 2023-05-05 MED ORDER — OXYCODONE HCL 5 MG PO TABS: 5.0000 mg | ORAL_TABLET | Freq: Once | ORAL | Status: DC | PRN

## 2023-05-05 MED ORDER — MUPIROCIN 2 % EX OINT: 1.0000 | TOPICAL_OINTMENT | Freq: Two times a day (BID) | CUTANEOUS | 0 refills | Status: AC

## 2023-05-05 MED ORDER — OXYCODONE HCL 5 MG/5ML PO SOLN: 5.0000 mg | Freq: Once | ORAL | Status: DC | PRN

## 2023-05-05 MED ORDER — ONDANSETRON HCL 4 MG PO TABS: 4.0000 mg | ORAL_TABLET | Freq: Four times a day (QID) | ORAL | Status: DC | PRN

## 2023-05-05 MED ORDER — OXYCODONE HCL 5 MG PO TABS
5.0000 mg | ORAL_TABLET | ORAL | Status: DC | PRN
Start: 2023-05-05 — End: 2023-05-08
  Administered 2023-05-05: 5 mg via ORAL
  Administered 2023-05-06 – 2023-05-07 (×2): 10 mg via ORAL
  Administered 2023-05-07: 5 mg via ORAL
  Filled 2023-05-05: qty 1
  Filled 2023-05-05 (×3): qty 2

## 2023-05-05 MED ORDER — FENTANYL CITRATE PF 50 MCG/ML IJ SOSY: 25.0000 ug | PREFILLED_SYRINGE | INTRAMUSCULAR | Status: DC | PRN

## 2023-05-05 MED ORDER — ALUM & MAG HYDROXIDE-SIMETH 200-200-20 MG/5ML PO SUSP: 30.0000 mL | ORAL | Status: DC | PRN

## 2023-05-05 MED ORDER — BUPIVACAINE-EPINEPHRINE (PF) 0.5% -1:200000 IJ SOLN
INTRAMUSCULAR | Status: DC | PRN
  Administered 2023-05-05: 30 mL via PERINEURAL

## 2023-05-05 MED ORDER — CHLORHEXIDINE GLUCONATE 4 % EX SOLN: 1.0000 | CUTANEOUS | 1 refills | Status: AC

## 2023-05-05 MED ORDER — TRANEXAMIC ACID-NACL 1000-0.7 MG/100ML-% IV SOLN
1000.0000 mg | INTRAVENOUS | Status: AC
Start: 2023-05-05 — End: 2023-05-05
  Administered 2023-05-05: 1000 mg via INTRAVENOUS
  Filled 2023-05-05: qty 100

## 2023-05-05 SURGICAL SUPPLY — 57 items
BAG COUNTER SPONGE SURGICOUNT (BAG) IMPLANT
BAG ZIPLOCK 12X15 (MISCELLANEOUS) ×2 IMPLANT
BENZOIN TINCTURE PRP APPL 2/3 (GAUZE/BANDAGES/DRESSINGS) IMPLANT
BLADE SAG 18X100X1.27 (BLADE) ×2 IMPLANT
BLADE SAW SGTL 11.0X1.19X90.0M (BLADE) IMPLANT
BLADE SURG SZ10 CARB STEEL (BLADE) IMPLANT
BNDG ELASTIC 6INX 5YD STR LF (GAUZE/BANDAGES/DRESSINGS) ×4 IMPLANT
BOWL SMART MIX CTS (DISPOSABLE) IMPLANT
CEMENT BONE R 1X40 (Cement) IMPLANT
COMP FEM PS KNEE NRW 6 LT (Joint) ×1 IMPLANT
COMP PATELLA 3 PEG 29X9 (Joint) ×1 IMPLANT
COMP TIB PS KNEE C 0D LT (Joint) ×1 IMPLANT
COMPONENT FEM PS KNEE NRW 6 LT (Joint) IMPLANT
COMPONENT PATELLA 3 PEG 29X9 (Joint) IMPLANT
COMPONET TIB PS KNEE C 0D LT (Joint) IMPLANT
COOLER ICEMAN CLASSIC (MISCELLANEOUS) ×2 IMPLANT
COVER SURGICAL LIGHT HANDLE (MISCELLANEOUS) ×2 IMPLANT
CUFF TRNQT CYL 34X4.125X (TOURNIQUET CUFF) ×2 IMPLANT
DRAPE INCISE IOBAN 66X45 STRL (DRAPES) ×2 IMPLANT
DRAPE U-SHAPE 47X51 STRL (DRAPES) ×2 IMPLANT
DURAPREP 26ML APPLICATOR (WOUND CARE) ×2 IMPLANT
ELECT BLADE TIP CTD 4 INCH (ELECTRODE) ×2 IMPLANT
ELECT REM PT RETURN 15FT ADLT (MISCELLANEOUS) ×2 IMPLANT
GAUZE PAD ABD 8X10 STRL (GAUZE/BANDAGES/DRESSINGS) ×4 IMPLANT
GAUZE SPONGE 4X4 12PLY STRL (GAUZE/BANDAGES/DRESSINGS) ×2 IMPLANT
GAUZE XEROFORM 1X8 LF (GAUZE/BANDAGES/DRESSINGS) IMPLANT
GLOVE BIO SURGEON STRL SZ7.5 (GLOVE) ×2 IMPLANT
GLOVE BIOGEL PI IND STRL 8 (GLOVE) ×4 IMPLANT
GLOVE ECLIPSE 8.0 STRL XLNG CF (GLOVE) ×2 IMPLANT
GOWN STRL REUS W/ TWL XL LVL3 (GOWN DISPOSABLE) ×4 IMPLANT
HOLDER FOLEY CATH W/STRAP (MISCELLANEOUS) IMPLANT
IMMOBILIZER KNEE 20 (SOFTGOODS) ×1 IMPLANT
IMMOBILIZER KNEE 20 THIGH 36 (SOFTGOODS) ×2 IMPLANT
INSERT ARTISURF SZ 6-7 LT (Insert) IMPLANT
KIT TURNOVER KIT A (KITS) IMPLANT
MANIFOLD NEPTUNE II (INSTRUMENTS) ×2 IMPLANT
NS IRRIG 1000ML POUR BTL (IV SOLUTION) ×2 IMPLANT
PACK TOTAL KNEE CUSTOM (KITS) ×2 IMPLANT
PAD COLD SHLDR WRAP-ON (PAD) ×2 IMPLANT
PADDING CAST COTTON 6X4 STRL (CAST SUPPLIES) ×4 IMPLANT
PIN DRILL HDLS TROCAR 75 4PK (PIN) IMPLANT
PROTECTOR NERVE ULNAR (MISCELLANEOUS) ×2 IMPLANT
SCREW FEMALE HEX FIX 25X2.5 (ORTHOPEDIC DISPOSABLE SUPPLIES) IMPLANT
SET HNDPC FAN SPRY TIP SCT (DISPOSABLE) ×2 IMPLANT
SET PAD KNEE POSITIONER (MISCELLANEOUS) ×2 IMPLANT
SPIKE FLUID TRANSFER (MISCELLANEOUS) IMPLANT
STAPLER SKIN PROX WIDE 3.9 (STAPLE) IMPLANT
STRIP CLOSURE SKIN 1/2X4 (GAUZE/BANDAGES/DRESSINGS) IMPLANT
SUT MNCRL AB 4-0 PS2 18 (SUTURE) IMPLANT
SUT VIC AB 0 CT1 27XBRD ANTBC (SUTURE) ×2 IMPLANT
SUT VIC AB 1 CT1 36 (SUTURE) ×4 IMPLANT
SUT VIC AB 2-0 CT1 TAPERPNT 27 (SUTURE) ×4 IMPLANT
TOWEL GREEN STERILE FF (TOWEL DISPOSABLE) ×2 IMPLANT
TRAY FOLEY MTR SLVR 14FR STAT (SET/KITS/TRAYS/PACK) IMPLANT
TRAY FOLEY MTR SLVR 16FR STAT (SET/KITS/TRAYS/PACK) IMPLANT
WATER STERILE IRR 1000ML POUR (IV SOLUTION) ×4 IMPLANT
YANKAUER SUCT BULB TIP NO VENT (SUCTIONS) ×2 IMPLANT

## 2023-05-05 NOTE — Op Note (Signed)
 Operative Note  Date of operation: 05/05/2023 Preoperative diagnosis: Left knee primary osteoarthritis Postoperative diagnosis: Same  Procedure: Left press-fit total knee arthroplasty  Implants: Biomet/Zimmer persona press-fit knee system Implant Name Type Inv. Item Serial No. Manufacturer Lot No. LRB No. Used Action  INSERT ARTISURF SZ 6-7 LT - [id number] Insert INSERT ARTISURF SZ 6-7 LT  ZIMMER RECON(ORTH,TRAU,BIO,SG) [id number] Left 1 Implanted  COMP TIB PS KNEE C 0D LT - [id number] Joint COMP TIB PS KNEE C 0D LT  ZIMMER RECON(ORTH,TRAU,BIO,SG) [id number] Left 1 Implanted  COMP FEM PS KNEE NRW 6 LT - [id number] Joint COMP FEM PS KNEE NRW 6 LT  ZIMMER RECON(ORTH,TRAU,BIO,SG) [id number] Left 1 Implanted  COMP PATELLA 3 PEG 29X9 - [id number] Joint COMP PATELLA 3 PEG 29X9  ZIMMER RECON(ORTH,TRAU,BIO,SG) [id number] Left 1 Implanted   Surgeon: Vanita Panda. Magnus Ivan, MD Assistant: Rexene Edison, PA-C  Anesthesia: #1 left lower extremity adductor canal block, #2 spinal, right 3 local Tourniquet time: Under 1 hour Antibiotics: IV Ancef EBL: Less than 50 cc Complications: None  Indications: The patient is a 53 year old active female with debilitating arthritis involving her left knee.  We have actually replaced her right knee in the past.  Although she is young she unfortunately has significant arthritis in her left knee and is now tried and failed conservative treatment for over a year.  At this point her left knee pain is daily and it is detrimentally fitting her mobility, her quality of life and her activities of daily living to the point she does wish to proceed with a total knee arthroplasty on the left side.  Having had this before her right side she is fully aware of the risks of acute blood loss anemia, nerve or vessel injury, fracture, infection, DVT and implant failure.  She understands that our goals are hopefully decreased pain, improved mobility and improved quality of life.  Procedure  description: After informed consent was obtained appropriate left knee was marked, the patient was brought to the operating room and set up on the operating table.  Of note she did have an adductor canal block of her left lower extremity in the holding room prior to being brought back to the operating room.  When she was set up on the operating table spinal anesthesia was obtained.  She was then laid in supine position on the operating table and a Foley catheter was placed.  A nonsterile tractors placed around upper left thigh and her left thigh, knee, leg and ankle were prepped and draped in DuraPrep and sterile drapes including a sterile stockinette.  A timeout was called and she was then about his correct patient and correct the left knee.  An Esmarch was then used to wrap out the leg and the tourniquet was plated 300 mm pressure.  With the knee extended a direct midline incision was made over the patella and carried proximally and distally.  Dissection was carried down the knee joint and a medial parapatellar arthrotomy was made finding a moderate joint effusion.  With the knee in a flexed position we found significant cartilage wear at the weightbearing surface of the medial and lateral femoral condyles and the trochlear groove.  We removed remnants of the ACL and medial lateral meniscus.  We then used an extramedullary cutting guide for making our proximal tibia cut correction for varus and valgus and a 7 degree slope.  We made this cut to take 2 mm off the low side and we did back down to more millimeters.  We then used an intramedullary based cutting guide for distal femur cut for a left knee setting this for 5 degrees externally rotated and a 10 mm distal femoral cut.  We made that cut without difficulty and brought the knee back down to full extension and had achieved full extension with a 10 mm extension block.  We then went back to the femur and put a femoral sizing guide based off the epicondylar axis.   Based off of this we chose a size 6 femur.  We put a 4-in-1 cutting block for a size 6 femur and made the anterior and posterior cuts followed our chamfer cuts.  We then backed the tibia and chose a size C left tibial tray for coverage over the tibial plateau setting the rotation of the tibial tubercle and the femur.  We did our drill hole and keel punch over this and found excellent quality bone for press-fit implants and this was appropriate given her young age of 60.  We then trialed our size C left tibia combined with our size 6 left CR standard femur.  We placed a 10 mm left medial congruent polythene insert and we are pleased with range of motion and stability without insert.  We then made our patella cut and drilled 3 holes for a size 29 patella button.  Again all trial rotation of the knee felt good in terms of range of motion and stability.  We then removed all trial rotation of the knee and irrigated the knee with normal saline solution.  We then placed Marcaine with epinephrine around the arthrotomy.  Next with the knee in a flexed position we dried the knee really well and then placed our Biomet/Zimmer persona press-fit tibial tray for a left knee size C followed by placing our size 6 narrow left press-fit CR femur.  We placed our left 10 mm thickness medial congruent polyethylene insert and press-fit our size 29 patella button.  Again we are pleased the range of motion and stability of the knee with the real implants in place.  The tourniquet was then let down and hemostasis was obtained with electrocautery.  The arthrotomy was closed with interrupted #1 Vicryl suture followed by 0 Vicryl to close deep tissue and 2-0 Vicryl to close subcutaneous tissue.  The skin was closed with staples.  Well-padded sterile dressing was then applied.  The patient was taken recovery room in stable condition.  Rexene Edison PA-C did assist during entire case and beginning to end and his assistance was crucial and medically  necessary for soft tissue management and retraction, helping guide implant placement and a layered closure of the wound.

## 2023-05-05 NOTE — Plan of Care (Signed)
   Problem: Coping: Goal: Level of anxiety will decrease Outcome: Progressing   Problem: Pain Managment: Goal: General experience of comfort will improve and/or be controlled Outcome: Progressing   Problem: Safety: Goal: Ability to remain free from injury will improve Outcome: Progressing

## 2023-05-05 NOTE — Anesthesia Procedure Notes (Addendum)
 Spinal  Patient location during procedure: OR Start time: 05/05/2023 2:25 PM End time: 05/05/2023 2:29 PM Reason for block: surgical anesthesia Staffing Performed: other anesthesia staff  Anesthesiologist: Greer Nation, MD Resident/CRNA: Deri Fuelling, CRNA Other anesthesia staff: Jamison Oka, RN Performed by: Sindy Guadeloupe, CRNA Authorized by: [place] Nation, MD   Preanesthetic Checklist Completed: patient identified, IV checked, site marked, risks and benefits discussed, surgical consent, monitors and equipment checked, pre-op evaluation and timeout performed Spinal Block Patient position: sitting Prep: ChloraPrep, DuraPrep and site prepped and draped Patient monitoring: heart rate, cardiac monitor, continuous pulse ox and blood pressure Approach: midline Location: L4-5 Injection technique: single-shot Needle Needle type: Pencan  Needle gauge: 24 G Needle length: 10 cm Assessment Events: CSF return Additional Notes Pt placed in sitting position, spinal kit expiration date checked and verified, timeout performed by [employer] CRNA; Connell SRNA, Treen DO, + CSF, - heme, pt tolerated well. Dr Charlynn Grimes present and supervising throughout placement of SAB. Adequate sensory level obtained for surgical procedure.

## 2023-05-05 NOTE — Anesthesia Procedure Notes (Deleted)
 Spinal  Staffing Performed by: Sindy Guadeloupe, CRNA Authorized by: [place] Nation, MD

## 2023-05-05 NOTE — Transfer of Care (Signed)
 Immediate Anesthesia Transfer of Care Note  Patient: Carrie [name]  Procedure(s) Performed: LEFT TOTAL KNEE ARTHROPLASTY (Left: Knee)  Patient Location: PACU  Anesthesia Type:MAC, Regional, and Spinal  Level of Consciousness: drowsy and patient cooperative  Airway & Oxygen Therapy: Patient Spontanous Breathing and Patient connected to face mask oxygen  Post-op Assessment: Report given to RN and Post -op Vital signs reviewed and stable  Post vital signs: Reviewed and stable  Last Vitals:  Vitals Value Taken Time  BP 130/71 05/05/23 1606  Temp    Pulse 82 05/05/23 1610  Resp 16 05/05/23 1610  SpO2 100 % 05/05/23 1610  Vitals shown include unfiled device data.  Last Pain:  Vitals:   05/05/23 1316  TempSrc:   PainSc: 0-No pain         Complications: No notable events documented.

## 2023-05-05 NOTE — Interval H&P Note (Signed)
 History and Physical Interval Note: Patient understands that she is here today for a left total knee arthroplasty to treat her significant left knee pain and arthritis.  There has been no acute or interval change in her medical status.  The risks and benefits of surgery have been discussed in detail and informed consent has been obtained.  The left operative knee has been marked.  05/05/2023 1:09 PM  Carrie [name]  has presented today for surgery, with the diagnosis of left knee osteoarthritis.  The various methods of treatment have been discussed with the patient and family. After consideration of risks, benefits and other options for treatment, the patient has consented to  Procedure(s): LEFT TOTAL KNEE ARTHROPLASTY (Left) as a surgical intervention.  The patient's history has been reviewed, patient examined, no change in status, stable for surgery.  I have reviewed the patient's chart and labs.  Questions were answered to the patient's satisfaction.     Kathryne Hitch

## 2023-05-05 NOTE — Anesthesia Preprocedure Evaluation (Addendum)
 Anesthesia Evaluation  Patient identified by MRN, date of birth, ID band Patient awake    Reviewed: Allergy & Precautions, NPO status , Patient's Chart, lab work & pertinent test results  History of Anesthesia Complications (+) PONV and history of anesthetic complications  Airway Mallampati: II       Dental   Pulmonary neg pulmonary ROS   Pulmonary exam normal        Cardiovascular hypertension, Pt. on medications Normal cardiovascular exam     Neuro/Psych  Headaches  negative psych ROS   GI/Hepatic Neg liver ROS,GERD  Medicated and Controlled,,  Endo/Other  diabetes, Oral Hypoglycemic AgentsHypothyroidism    Renal/GU negative Renal ROS     Musculoskeletal  (+) Arthritis ,    Abdominal  (+) + obese  Peds  Hematology negative hematology ROS (+)   Anesthesia Other Findings left knee osteoarthritis  Reproductive/Obstetrics                             Anesthesia Physical Anesthesia Plan  ASA: 3  Anesthesia Plan: Regional and Spinal   Post-op Pain Management: Regional block*   Induction:   PONV Risk Score and Plan: 3 and Ondansetron, Dexamethasone, Propofol infusion, Midazolam and Treatment may vary due to age or medical condition  Airway Management Planned: Simple Face Mask  Additional Equipment:   Intra-op Plan:   Post-operative Plan:   Informed Consent: I have reviewed the patients History and Physical, chart, labs and discussed the procedure including the risks, benefits and alternatives for the proposed anesthesia with the patient or authorized representative who has indicated his/her understanding and acceptance.     Dental advisory given  Plan Discussed with: CRNA  Anesthesia Plan Comments:        Anesthesia Quick Evaluation

## 2023-05-05 NOTE — Anesthesia Procedure Notes (Signed)
 Procedure Name: MAC Date/Time: 05/05/2023 2:19 PM  Performed by: Sindy Guadeloupe, CRNAPre-anesthesia Checklist: Patient identified, Emergency Drugs available, Suction available, Patient being monitored and Timeout performed Oxygen Delivery Method: Simple face mask Placement Confirmation: positive ETCO2

## 2023-05-05 NOTE — Anesthesia Procedure Notes (Signed)
 Anesthesia Regional Block: Adductor canal block   Pre-Anesthetic Checklist: , timeout performed,  Correct Patient, Correct Site, Correct Laterality,  Correct Procedure,, site marked,  Risks and benefits discussed,  Surgical consent,  Pre-op evaluation,  At surgeon's request and post-op pain management  Laterality: Left  Prep: chloraprep       Needles:  Injection technique: Single-shot  Needle Type: Echogenic Stimulator Needle     Needle Length: 10cm  Needle Gauge: 20     Additional Needles:   Procedures:,,,, ultrasound used (permanent image in chart),,    Narrative:  Start time: 05/05/2023 1:40 PM End time: 05/05/2023 1:50 PM Injection made incrementally with aspirations every 5 mL.  Performed by: Personally  Anesthesiologist: Leonides Grills, MD  Additional Notes: Functioning IV was confirmed and monitors were applied. A time-out was performed. Hand hygiene and sterile gloves were used. The thigh was placed in a frog-leg position and prepped in a sterile fashion. A 20ga Bbraun echogenic stimulator needle was placed using ultrasound guidance.  Negative aspiration and negative test dose prior to incremental administration of local anesthetic. The patient tolerated the procedure well.

## 2023-05-05 NOTE — Plan of Care (Signed)

## 2023-05-06 DIAGNOSIS — Z7982 Long term (current) use of aspirin: Secondary | ICD-10-CM | POA: Diagnosis not present

## 2023-05-06 DIAGNOSIS — E119 Type 2 diabetes mellitus without complications: Secondary | ICD-10-CM | POA: Diagnosis not present

## 2023-05-06 DIAGNOSIS — Z79899 Other long term (current) drug therapy: Secondary | ICD-10-CM | POA: Diagnosis not present

## 2023-05-06 DIAGNOSIS — Z7984 Long term (current) use of oral hypoglycemic drugs: Secondary | ICD-10-CM | POA: Diagnosis not present

## 2023-05-06 DIAGNOSIS — E039 Hypothyroidism, unspecified: Secondary | ICD-10-CM | POA: Diagnosis not present

## 2023-05-06 DIAGNOSIS — Z96651 Presence of right artificial knee joint: Secondary | ICD-10-CM | POA: Diagnosis not present

## 2023-05-06 DIAGNOSIS — M1712 Unilateral primary osteoarthritis, left knee: Secondary | ICD-10-CM | POA: Diagnosis not present

## 2023-05-06 DIAGNOSIS — I10 Essential (primary) hypertension: Secondary | ICD-10-CM | POA: Diagnosis not present

## 2023-05-06 LAB — CBC
HCT: 35.2 % — ABNORMAL LOW (ref 36.0–46.0)
Hemoglobin: 11.1 g/dL — ABNORMAL LOW (ref 12.0–15.0)
MCH: 30.7 pg (ref 26.0–34.0)
MCHC: 31.5 g/dL (ref 30.0–36.0)
MCV: 97.2 fL (ref 80.0–100.0)
Platelets: 328 10*3/uL (ref 150–400)
RBC: 3.62 MIL/uL — ABNORMAL LOW (ref 3.87–5.11)
RDW: 14.6 % (ref 11.5–15.5)
WBC: 12.7 10*3/uL — ABNORMAL HIGH (ref 4.0–10.5)
nRBC: 0 % (ref 0.0–0.2)

## 2023-05-06 LAB — BASIC METABOLIC PANEL
Anion gap: 10 (ref 5–15)
BUN: 16 mg/dL (ref 6–20)
CO2: 23 mmol/L (ref 22–32)
Calcium: 8.2 mg/dL — ABNORMAL LOW (ref 8.9–10.3)
Chloride: 104 mmol/L (ref 98–111)
Creatinine, Ser: 0.97 mg/dL (ref 0.44–1.00)
GFR, Estimated: >60 mL/min (ref >60)
Glucose, Bld: 271 mg/dL — ABNORMAL HIGH (ref 70–99)
Potassium: 4.2 mmol/L (ref 3.5–5.1)
Sodium: 137 mmol/L (ref 135–145)

## 2023-05-06 MED ORDER — OXYCODONE HCL 5 MG PO TABS
5.0000 mg | ORAL_TABLET | Freq: Four times a day (QID) | ORAL | 0 refills | Status: DC | PRN
Start: 2023-05-06 — End: 2023-05-16

## 2023-05-06 MED ORDER — ASPIRIN 81 MG PO CHEW: 81.0000 mg | CHEWABLE_TABLET | Freq: Two times a day (BID) | ORAL | 0 refills | Status: DC

## 2023-05-06 MED ORDER — ONDANSETRON 4 MG PO TBDP: 4.0000 mg | ORAL_TABLET | Freq: Three times a day (TID) | ORAL | 0 refills | Status: AC | PRN

## 2023-05-06 MED ORDER — TIZANIDINE HCL 4 MG PO TABS: 4.0000 mg | ORAL_TABLET | Freq: Four times a day (QID) | ORAL | 0 refills | Status: AC | PRN

## 2023-05-06 NOTE — Progress Notes (Signed)
 Subjective: 1 Day Post-Op Procedure(s) (LRB): LEFT TOTAL KNEE ARTHROPLASTY (Left) Patient reports pain as moderate.  H&H stable, but orthostatic when up.  BP meds held.  Fortunately no nausea like the last time.  Objective: Vital signs in last 24 hours: Temp:  [97.4 F (36.3 C)-98.7 F (37.1 C)] 97.4 F (36.3 C) (03/22 0938) Pulse Rate:  [63-92] 66 (03/22 1023) Resp:  [13-19] 15 (03/22 1023) BP: (91-163)/(49-94) 102/49 (03/22 1023) SpO2:  [92 %-100 %] 96 % (03/22 1023) Weight:  [72.6 kg] 72.6 kg (03/21 1316)  Intake/Output from previous day: 03/21 0701 - 03/22 0700 In: 1244.1 [P.O.:120; I.V.:824.1; IV Piggyback:300] Out: 1350 [Urine:1300; Blood:50] Intake/Output this shift: Total I/O In: 360 [P.O.:360] Out: -   Recent Labs    05/06/23 0322  HGB 11.1*   Recent Labs    05/06/23 0322  WBC 12.7*  RBC 3.62*  HCT 35.2*  PLT 328   Recent Labs    05/06/23 0322  NA 137  K 4.2  CL 104  CO2 23  BUN 16  CREATININE 0.97  GLUCOSE 271*  CALCIUM 8.2*   No results for input(s): "LABPT", "INR" in the last 72 hours.  Sensation intact distally Intact pulses distally Dorsiflexion/Plantar flexion intact Incision: dressing C/D/I Compartment soft   Assessment/Plan: 1 Day Post-Op Procedure(s) (LRB): LEFT TOTAL KNEE ARTHROPLASTY (Left) Up with therapy Plan for discharge tomorrow Discharge home with home health      Kathryne Hitch 05/06/2023, 11:54 AM

## 2023-05-06 NOTE — Evaluation (Signed)
 Physical Therapy Evaluation Patient Details Name: Carrie [name] MRN: [id number] DOB: 05-08-1970 Today's Date: 05/06/2023  History of Present Illness  Pt s/p L TKR and with hx of DM and R TKR (23)  Clinical Impression  Pt s/p L TKR and presents with decreased L LE strength/ROM and post op pain limiting functional mobility.  Pt should progress to dc home with family assist.  This date, HEP initiated, pt up to bathroom for toileting with hand hygiene standing at sink; and ambulated in hall.  Pt hopeful for dc home this date.        If plan is discharge home, recommend the following: A little help with walking and/or transfers;A little help with bathing/dressing/bathroom;Assistance with cooking/housework;Assist for transportation;Help with stairs or ramp for entrance   Can travel by private vehicle        Equipment Recommendations None recommended by PT  Recommendations for Other Services       Functional Status Assessment Patient has had a recent decline in their functional status and demonstrates the ability to make significant improvements in function in a reasonable and predictable amount of time.     Precautions / Restrictions Precautions Precautions: Knee;Fall Required Braces or Orthoses: Knee Immobilizer - Left Knee Immobilizer - Left: Discontinue once straight leg raise with < 10 degree lag (pt performed IND SLR this date) Restrictions Weight Bearing Restrictions Per Provider Order: Yes LLE Weight Bearing Per Provider Order: Weight bearing as tolerated      Mobility  Bed Mobility Overal bed mobility: Needs Assistance Bed Mobility: Supine to Sit     Supine to sit: Min assist     General bed mobility comments: cues for sequence and use of R LE to self assist    Transfers Overall transfer level: Needs assistance Equipment used: Rolling walker (2 wheels) Transfers: Sit to/from Stand Sit to Stand: Min assist, Contact guard assist           General transfer  comment: cues for LE management and use of UEs to self assist    Ambulation/Gait Ambulation/Gait assistance: Min assist, Contact guard assist Gait Distance (Feet): 100 Feet (and 15' into bathroom) Assistive device: Rolling walker (2 wheels) Gait Pattern/deviations: Step-to pattern, Step-through pattern, Decreased step length - right, Decreased step length - left, Shuffle, Trunk flexed       General Gait Details: cues for sequence, posture and position from Rw  Stairs            Wheelchair Mobility     Tilt Bed    Modified Rankin (Stroke Patients Only)       Balance Overall balance assessment: Needs assistance Sitting-balance support: No upper extremity supported, Feet supported Sitting balance-Leahy Scale: Good     Standing balance support: Bilateral upper extremity supported Standing balance-Leahy Scale: Poor                               Pertinent Vitals/Pain Pain Assessment Pain Assessment: 0-10 Pain Score: 5  Pain Location: L Knee Pain Descriptors / Indicators: Aching, Sore Pain Intervention(s): Limited activity within patient's tolerance, Monitored during session, Premedicated before session, Ice applied    Home Living Family/patient expects to be discharged to:: Private residence Living Arrangements: Spouse/significant other Available Help at Discharge: Family Type of Home: House Home Access: Stairs to enter   Secretary/administrator of Steps: 1   Home Layout: One level Home Equipment: Agricultural consultant (2 wheels);Crutches  Prior Function Prior Level of Function : Independent/Modified Independent                     Extremity/Trunk Assessment   Upper Extremity Assessment Upper Extremity Assessment: Overall WFL for tasks assessed    Lower Extremity Assessment Lower Extremity Assessment: LLE deficits/detail LLE Deficits / Details: IND SLR with AAROM at knee -5 - 90    Cervical / Trunk Assessment Cervical / Trunk  Assessment: Normal  Communication   Communication Communication: No apparent difficulties    Cognition Arousal: Alert Behavior During Therapy: WFL for tasks assessed/performed   PT - Cognitive impairments: No apparent impairments                         Following commands: Intact       Cueing Cueing Techniques: Verbal cues     General Comments      Exercises Total Joint Exercises Ankle Circles/Pumps: AROM, Both, 15 reps, Supine Quad Sets: AROM, Both, 10 reps, Supine Heel Slides: AAROM, Left, 15 reps, Supine Straight Leg Raises: AAROM, AROM, Left, 15 reps, Supine   Assessment/Plan    PT Assessment Patient needs continued PT services  PT Problem List Decreased strength;Decreased range of motion;Decreased activity tolerance;Decreased balance;Decreased mobility;Decreased knowledge of use of DME;Pain       PT Treatment Interventions DME instruction;Gait training;Stair training;Functional mobility training;Therapeutic activities;Therapeutic exercise;Patient/family education    PT Goals (Current goals can be found in the Care Plan section)  Acute Rehab PT Goals Patient Stated Goal: Regain IND PT Goal Formulation: With patient Time For Goal Achievement: 05/13/23 Potential to Achieve Goals: Good    Frequency 7X/week     Co-evaluation               AM-PAC PT "6 Clicks" Mobility  Outcome Measure Help needed turning from your back to your side while in a flat bed without using bedrails?: A Little Help needed moving from lying on your back to sitting on the side of a flat bed without using bedrails?: A Little Help needed moving to and from a bed to a chair (including a wheelchair)?: A Little Help needed standing up from a chair using your arms (e.g., wheelchair or bedside chair)?: A Little Help needed to walk in hospital room?: A Little Help needed climbing 3-5 steps with a railing? : A Little 6 Click Score: 18    End of Session Equipment Utilized  During Treatment: Gait belt Activity Tolerance: Patient tolerated treatment well Patient left: in chair;with call bell/phone within reach;with chair alarm set Nurse Communication: Mobility status PT Visit Diagnosis: Difficulty in walking, not elsewhere classified (R26.2)    Time: 0823-0902 PT Time Calculation (min) (ACUTE ONLY): 39 min   Charges:   PT Evaluation $PT Eval Low Complexity: 1 Low PT Treatments $Gait Training: 8-22 mins $Therapeutic Exercise: 8-22 mins PT General Charges $$ ACUTE PT VISIT: 1 Visit         Mauro Kaufmann PT Acute Rehabilitation Services Pager [phone] Office [phone]   Mohan Erven 05/06/2023, 2:02 PM

## 2023-05-06 NOTE — Discharge Instructions (Signed)
 Per [facility] clinic policy, our goal is ensure optimal postoperative pain control with a multimodal pain management strategy. For all [facility] patients, our goal is to wean post-operative narcotic medications by 6 weeks post-operatively. If this is not possible due to utilization of pain medication prior to surgery, your [facility] doctor will support your acute post-operative pain control for the first 6 weeks postoperatively, with a plan to transition you back to your primary pain team following that. Carrie [name] will work to ensure a Therapist, occupational.  INSTRUCTIONS AFTER JOINT REPLACEMENT   Remove items at home which could result in a fall. This includes throw rugs or furniture in walking pathways ICE to the affected joint every three hours while awake for 30 minutes at a time, for at least the first 3-5 days, and then as needed for pain and swelling.  Continue to use ice for pain and swelling. You may notice swelling that will progress down to the foot and ankle.  This is normal after surgery.  Elevate your leg when you are not up walking on it.   Continue to use the breathing machine you got in the hospital (incentive spirometer) which will help keep your temperature down.  It is common for your temperature to cycle up and down following surgery, especially at night when you are not up moving around and exerting yourself.  The breathing machine keeps your lungs expanded and your temperature down.   DIET:  As you were doing prior to hospitalization, we recommend a well-balanced diet.  DRESSING / WOUND CARE / SHOWERING  Keep the surgical dressing until follow up.  The dressing is water proof, so you can shower without any extra covering.  IF THE DRESSING FALLS OFF or the wound gets wet inside, change the dressing with sterile gauze.  Please use good hand washing techniques before changing the dressing.  Do not use any lotions or creams on the incision until instructed by your surgeon.     ACTIVITY  Increase activity slowly as tolerated, but follow the weight bearing instructions below.   No driving for 6 weeks or until further direction given by your physician.  You cannot drive while taking narcotics.  No lifting or carrying greater than 10 lbs. until further directed by your surgeon. Avoid periods of inactivity such as sitting longer than an hour when not asleep. This helps prevent blood clots.  You may return to work once you are authorized by your doctor.     WEIGHT BEARING   Weight bearing as tolerated with assist device (walker, cane, etc) as directed, use it as long as suggested by your surgeon or therapist, typically at least 4-6 weeks.   EXERCISES  Results after joint replacement surgery are often greatly improved when you follow the exercise, range of motion and muscle strengthening exercises prescribed by your doctor. Safety measures are also important to protect the joint from further injury. Any time any of these exercises cause you to have increased pain or swelling, decrease what you are doing until you are comfortable again and then slowly increase them. If you have problems or questions, call your caregiver or physical therapist for advice.   Rehabilitation is important following a joint replacement. After just a few days of immobilization, the muscles of the leg can become weakened and shrink (atrophy).  These exercises are designed to build up the tone and strength of the thigh and leg muscles and to improve motion. Often times heat used for twenty to thirty minutes before  working out will loosen up your tissues and help with improving the range of motion but do not use heat for the first two weeks following surgery (sometimes heat can increase post-operative swelling).   These exercises can be done on a training (exercise) mat, on the floor, on a table or on a bed. Use whatever works the best and is most comfortable for you.    Use music or television  while you are exercising so that the exercises are a pleasant break in your day. This will make your life better with the exercises acting as a break in your routine that you can look forward to.   Perform all exercises about fifteen times, three times per day or as directed.  You should exercise both the operative leg and the other leg as well.  Exercises include:   Quad Sets - Tighten up the muscle on the front of the thigh (Quad) and hold for 5-10 seconds.   Straight Leg Raises - With your knee straight (if you were given a brace, keep it on), lift the leg to 60 degrees, hold for 3 seconds, and slowly lower the leg.  Perform this exercise against resistance later as your leg gets stronger.  Leg Slides: Lying on your back, slowly slide your foot toward your buttocks, bending your knee up off the floor (only go as far as is comfortable). Then slowly slide your foot back down until your leg is flat on the floor again.  Angel Wings: Lying on your back spread your legs to the side as far apart as you can without causing discomfort.  Hamstring Strength:  Lying on your back, push your heel against the floor with your leg straight by tightening up the muscles of your buttocks.  Repeat, but this time bend your knee to a comfortable angle, and push your heel against the floor.  You may put a pillow under the heel to make it more comfortable if necessary.   A rehabilitation program following joint replacement surgery can speed recovery and prevent re-injury in the future due to weakened muscles. Contact your doctor or a physical therapist for more information on knee rehabilitation.    CONSTIPATION  Constipation is defined medically as fewer than three stools per week and severe constipation as less than one stool per week.  Even if you have a regular bowel pattern at home, your normal regimen is likely to be disrupted due to multiple reasons following surgery.  Combination of anesthesia, postoperative  narcotics, change in appetite and fluid intake all can affect your bowels.   YOU MUST use at least one of the following options; they are listed in order of increasing strength to get the job done.  They are all available over the counter, and you may need to use some, POSSIBLY even all of these options:    Drink plenty of fluids (prune juice may be helpful) and high fiber foods Colace 100 mg by mouth twice a day  Senokot for constipation as directed and as needed Dulcolax (bisacodyl), take with full glass of water  Miralax (polyethylene glycol) once or twice a day as needed.  If you have tried all these things and are unable to have a bowel movement in the first 3-4 days after surgery call either your surgeon or your primary doctor.    If you experience loose stools or diarrhea, hold the medications until you stool forms back up.  If your symptoms do not get better within 1 week  or if they get worse, check with your doctor.  If you experience "the worst abdominal pain ever" or develop nausea or vomiting, please contact the office immediately for further recommendations for treatment.   ITCHING:  If you experience itching with your medications, try taking only a single pain pill, or even half a pain pill at a time.  You can also use Benadryl over the counter for itching or also to help with sleep.   TED HOSE STOCKINGS:  Use stockings on both legs until for at least 2 weeks or as directed by physician office. They may be removed at night for sleeping.  MEDICATIONS:  See your medication summary on the "After Visit Summary" that nursing will review with you.  You may have some home medications which will be placed on hold until you complete the course of blood thinner medication.  It is important for you to complete the blood thinner medication as prescribed.  PRECAUTIONS:  If you experience chest pain or shortness of breath - call 911 immediately for transfer to the hospital emergency department.    If you develop a fever greater that 101 F, purulent drainage from wound, increased redness or drainage from wound, foul odor from the wound/dressing, or calf pain - CONTACT YOUR SURGEON.                                                   FOLLOW-UP APPOINTMENTS:  If you do not already have a post-op appointment, please call the office for an appointment to be seen by your surgeon.  Guidelines for how soon to be seen are listed in your "After Visit Summary", but are typically between 1-4 weeks after surgery.  OTHER INSTRUCTIONS:   Knee Replacement:  Do not place pillow under knee, focus on keeping the knee straight while resting. CPM instructions: 0-90 degrees, 2 hours in the morning, 2 hours in the afternoon, and 2 hours in the evening. Place foam block, curve side up under heel at all times except when in CPM or when walking.  DO NOT modify, tear, cut, or change the foam block in any way.  POST-OPERATIVE OPIOID TAPER INSTRUCTIONS: It is important to wean off of your opioid medication as soon as possible. If you do not need pain medication after your surgery it is ok to stop day one. Opioids include: Codeine, Hydrocodone(Norco, Vicodin), Oxycodone(Percocet, oxycontin) and hydromorphone amongst others.  Long term and even short term use of opiods can cause: Increased pain response Dependence Constipation Depression Respiratory depression And more.  Withdrawal symptoms can include Flu like symptoms Nausea, vomiting And more Techniques to manage these symptoms Hydrate well Eat regular healthy meals Stay active Use relaxation techniques(deep breathing, meditating, yoga) Do Not substitute Alcohol to help with tapering If you have been on opioids for less than two weeks and do not have pain than it is ok to stop all together.  Plan to wean off of opioids This plan should start within one week post op of your joint replacement. Maintain the same interval or time between taking each dose  and first decrease the dose.  Cut the total daily intake of opioids by one tablet each day Next start to increase the time between doses. The last dose that should be eliminated is the evening dose.   MAKE SURE YOU:  Understand these instructions.  Get help right away if you are not doing well or get worse.    Thank you for letting us be a part of your medical care team.  It is a privilege we respect greatly.  We hope these instructions will help you stay on track for a fast and full recovery!      Dental Antibiotics:  In most cases prophylactic antibiotics for Dental procdeures after total joint surgery are not necessary.  Exceptions are as follows:  1. History of prior total joint infection  2. Severely immunocompromised (Organ Transplant, cancer chemotherapy, Rheumatoid biologic meds such as Humera)  3. Poorly controlled diabetes (A1C &gt; 8.0, blood glucose over 200)  If you have one of these conditions, contact your surgeon for an antibiotic prescription, prior to your dental procedure.

## 2023-05-06 NOTE — Progress Notes (Signed)
 Physical Therapy Treatment Patient Details Name: Carrie [name] MRN: [id number] DOB: [birth year] Today's Date: 05/06/2023   History of Present Illness Pt s/p L TKR and with hx of DM and R TKR (23)    PT Comments  Pt very cooperative and progressing well with mobility including up to ambulate increased distance in hall and returned to bed supervision level utilizing gait belt to self assist L LE.  Orthostatic BPs performed with pt maintaining BP with activity and readings provided to RN.  Pt hopeful for dc home tomorrow am.    If plan is discharge home, recommend the following: A little help with walking and/or transfers;A little help with bathing/dressing/bathroom;Assistance with cooking/housework;Assist for transportation;Help with stairs or ramp for entrance   Can travel by private vehicle        Equipment Recommendations  None recommended by PT    Recommendations for Other Services       Precautions / Restrictions Precautions Precautions: Knee;Fall Required Braces or Orthoses: Knee Immobilizer - Left Knee Immobilizer - Left: Discontinue once straight leg raise with < 10 degree lag Restrictions Weight Bearing Restrictions Per Provider Order: Yes LLE Weight Bearing Per Provider Order: Weight bearing as tolerated     Mobility  Bed Mobility Overal bed mobility: Needs Assistance Bed Mobility: Sit to Supine     Supine to sit: Min assist Sit to supine: Supervision   General bed mobility comments: use of gait belt to manage L LE    Transfers Overall transfer level: Needs assistance Equipment used: Rolling walker (2 wheels) Transfers: Sit to/from Stand Sit to Stand: Contact guard assist           General transfer comment: cues for LE management and use of UEs to self assist    Ambulation/Gait Ambulation/Gait assistance: Contact guard assist Gait Distance (Feet): 150 Feet Assistive device: Rolling walker (2 wheels) Gait Pattern/deviations: Step-to pattern,  Step-through pattern, Decreased step length - right, Decreased step length - left, Shuffle, Trunk flexed       General Gait Details: cues for sequence, posture and position from Rw   Stairs             Wheelchair Mobility     Tilt Bed    Modified Rankin (Stroke Patients Only)       Balance Overall balance assessment: Needs assistance Sitting-balance support: No upper extremity supported, Feet supported Sitting balance-Leahy Scale: Good     Standing balance support: Bilateral upper extremity supported Standing balance-Leahy Scale: Poor                              Communication Communication Communication: No apparent difficulties  Cognition Arousal: Alert Behavior During Therapy: WFL for tasks assessed/performed   PT - Cognitive impairments: No apparent impairments                         Following commands: Intact      Cueing Cueing Techniques: Verbal cues  Exercises Total Joint Exercises Ankle Circles/Pumps: AROM, Both, 15 reps, Supine Quad Sets: AROM, Both, 10 reps, Supine Heel Slides: AAROM, Left, 15 reps, Supine Straight Leg Raises: AAROM, AROM, Left, 15 reps, Supine    General Comments        Pertinent Vitals/Pain Pain Assessment Pain Assessment: 0-10 Pain Score: 4  Pain Location: L Knee Pain Descriptors / Indicators: Aching, Sore Pain Intervention(s): Limited activity within patient's tolerance, Monitored during session, Premedicated before session,  Ice applied    Home Living Family/patient expects to be discharged to:: Private residence Living Arrangements: Spouse/significant other Available Help at Discharge: Family Type of Home: House Home Access: Stairs to enter   Secretary/administrator of Steps: 1   Home Layout: One level Home Equipment: Agricultural consultant (2 wheels);Crutches      Prior Function            PT Goals (current goals can now be found in the care plan section) Acute Rehab PT Goals Patient  Stated Goal: Regain IND PT Goal Formulation: With patient Time For Goal Achievement: 05/13/23 Potential to Achieve Goals: Good Progress towards PT goals: Progressing toward goals    Frequency    7X/week      PT Plan      Co-evaluation              AM-PAC PT "6 Clicks" Mobility   Outcome Measure  Help needed turning from your back to your side while in a flat bed without using bedrails?: A Little Help needed moving from lying on your back to sitting on the side of a flat bed without using bedrails?: A Little Help needed moving to and from a bed to a chair (including a wheelchair)?: A Little Help needed standing up from a chair using your arms (e.g., wheelchair or bedside chair)?: A Little Help needed to walk in hospital room?: A Little Help needed climbing 3-5 steps with a railing? : A Little 6 Click Score: 18    End of Session Equipment Utilized During Treatment: Gait belt Activity Tolerance: Patient tolerated treatment well Patient left: in bed;with call bell/phone within reach;with bed alarm set Nurse Communication: Mobility status PT Visit Diagnosis: Difficulty in walking, not elsewhere classified (R26.2)     Time: 1610-9604 PT Time Calculation (min) (ACUTE ONLY): 27 min  Charges:    $Gait Training: 8-22 mins $Therapeutic Exercise: 8-22 mins $Therapeutic Activity: 8-22 mins PT General Charges $$ ACUTE PT VISIT: 1 Visit                     Mauro Kaufmann PT Acute Rehabilitation Services Pager [phone] Office [phone]    Carrie [name] 05/06/2023, 3:57 PM

## 2023-05-07 DIAGNOSIS — Z96651 Presence of right artificial knee joint: Secondary | ICD-10-CM | POA: Diagnosis not present

## 2023-05-07 DIAGNOSIS — Z7982 Long term (current) use of aspirin: Secondary | ICD-10-CM | POA: Diagnosis not present

## 2023-05-07 DIAGNOSIS — Z79899 Other long term (current) drug therapy: Secondary | ICD-10-CM | POA: Diagnosis not present

## 2023-05-07 DIAGNOSIS — I10 Essential (primary) hypertension: Secondary | ICD-10-CM | POA: Diagnosis not present

## 2023-05-07 DIAGNOSIS — M1712 Unilateral primary osteoarthritis, left knee: Secondary | ICD-10-CM | POA: Diagnosis not present

## 2023-05-07 DIAGNOSIS — Z7984 Long term (current) use of oral hypoglycemic drugs: Secondary | ICD-10-CM | POA: Diagnosis not present

## 2023-05-07 DIAGNOSIS — E119 Type 2 diabetes mellitus without complications: Secondary | ICD-10-CM | POA: Diagnosis not present

## 2023-05-07 DIAGNOSIS — E039 Hypothyroidism, unspecified: Secondary | ICD-10-CM | POA: Diagnosis not present

## 2023-05-07 LAB — GLUCOSE, CAPILLARY: Glucose-Capillary: 126 mg/dL — ABNORMAL HIGH (ref 70–99)

## 2023-05-07 NOTE — Progress Notes (Signed)
 Patient ID: Carrie [name], female   DOB: 06/29/1970, 53 y.o.   MRN: [id number] Patient is status post total knee arthroplasty on the left.  Patient complains of left ankle pain.  Examination of the left ankle is stable.  The calf is soft nontender no evidence of DVT.  Plan for discharge to home today.  There are orders placed for social worker for home health therapy and DME.

## 2023-05-07 NOTE — Plan of Care (Signed)
  Problem: Clinical Measurements: Goal: Will remain free from infection Outcome: Progressing   Problem: Education: Goal: Knowledge of General Education information will improve Description: Including pain rating scale, medication(s)/side effects and non-pharmacologic comfort measures Outcome: Progressing   Problem: Nutrition: Goal: Adequate nutrition will be maintained Outcome: Progressing

## 2023-05-07 NOTE — TOC Transition Note (Signed)
 Transition of Care [facility]) - Discharge Note   Patient Details  Name: Carrie [name] MRN: [id number] Date of Birth: [birth year]  Transition of Care [facility]) CM/SW Contact:  Epifanio Lesches, RN Phone Number: 05/07/2023, 9:57 AM   Clinical Narrative:    Patient will DC to: home Anticipated DC date: 05/07/2023 Family notified: yes Transport by: car      - s/p total left knee replacement 3/21  Per MD patient ready for DC today.RN, patient, patient's husband, and [facility] notified of DC. DME orders noted. Pt without DME needs. States has equipment @ home. Pt without RX med concerns.  Post hospital follow noted on AVS. Husband to provide transportation to home.  RNCM will sign off for now as intervention is no longer needed. Please consult [place] again if new needs arise.   Final next level of care: Home w Home Health Services Barriers to Discharge: No Barriers Identified   Patient Goals and CMS Choice            Discharge Placement        Discharge Plan and Services Additional resources added to the After Visit Summary for       [facility] Arranged: PT HH Agency: [facility] Date HH Agency Contacted: 05/07/23 Time HH Agency Contacted: [phone]    Social Drivers of Health (SDOH) Interventions SDOH Screenings   Food Insecurity: No Food Insecurity (05/05/2023)  Housing: Low Risk  (05/05/2023)  Transportation Needs: No Transportation Needs (05/05/2023)  Utilities: Not At Risk (05/05/2023)  Financial Resource Strain: Low Risk  (01/07/2021)   Received from [facility], [facility]  Social Connections: Moderately Integrated (05/05/2023)  Tobacco Use: Low Risk  (05/05/2023)     Readmission Risk Interventions     No data to display

## 2023-05-07 NOTE — Anesthesia Postprocedure Evaluation (Signed)
 Anesthesia Post Note  Patient: Carrie [name]  Procedure(s) Performed: LEFT TOTAL KNEE ARTHROPLASTY (Left: Knee)     Patient location during evaluation: PACU Anesthesia Type: Spinal Level of consciousness: oriented and awake and alert Pain management: pain level controlled Vital Signs Assessment: post-procedure vital signs reviewed and stable Respiratory status: spontaneous breathing, respiratory function stable and patient connected to nasal cannula oxygen Cardiovascular status: blood pressure returned to baseline and stable Postop Assessment: no headache, no backache and no apparent nausea or vomiting Anesthetic complications: no   No notable events documented.  Last Vitals:  Vitals:   05/07/23 1633 05/07/23 1800  BP: (!) 96/52 (!) 96/56  Pulse: 91 77  Resp:  20  Temp:  36.9 C  SpO2: (!) 87% 98%    Last Pain:  Vitals:   05/07/23 1800  TempSrc: Oral  PainSc:                  Vaughn Nation

## 2023-05-07 NOTE — Discharge Summary (Signed)
 Physician Discharge Summary  Patient ID: Carrie [name] MRN: [id number] DOB/AGE: 08-01-1970 53 y.o.  Admit date: 05/05/2023 Discharge date: 05/07/2023  Admission Diagnoses:  Principal Problem:   Unilateral primary osteoarthritis, left knee Active Problems:   Status post total left knee replacement   Discharge Diagnoses:  Same  Past Medical History:  Diagnosis Date   Arthritis    Arthrofibrosis of right total knee arthroplasty, subsequent encounter 05/20/2021   Diabetes mellitus without complication (HCC)    type 2   Essential hypertension    GERD (gastroesophageal reflux disease)    Headache    migraine   Hypothyroidism    Mixed hyperlipidemia    PONV (postoperative nausea and vomiting)    Really Sick!!! Had to stay in the hospital    Surgeries: Procedure(s): LEFT TOTAL KNEE ARTHROPLASTY on 05/05/2023   Consultants:   Discharged Condition: Improved  Hospital Course: Carrie [name] is an 53 y.o. female who was admitted 05/05/2023 with a chief complaint of No chief complaint on file. , and found to have a diagnosis of Unilateral primary osteoarthritis, left knee.  They were brought to the operating room on 05/05/2023 and underwent the above named procedures.    They were given perioperative antibiotics:  Anti-infectives (From admission, onward)    Start     Dose/Rate Route Frequency Ordered Stop   05/05/23 2230  ceFAZolin (ANCEF) IVPB 2g/100 mL premix        2 g 200 mL/hr over 30 Minutes Intravenous Every 6 hours 05/05/23 1739 05/06/23 0554   05/05/23 1200  ceFAZolin (ANCEF) IVPB 2g/100 mL premix        2 g 200 mL/hr over 30 Minutes Intravenous On call to O.R. 05/05/23 1149 05/05/23 1442     .  They were given compression stockings, early ambulation, and chemoprophylaxis for DVT prophylaxis.  They benefited maximally from their hospital stay and there were no complications.    Recent vital signs:  Vitals:   05/06/23 2151 05/07/23 0526  BP: 128/75 99/62   Pulse: 72 85  Resp: 17 17  Temp: (!) 97.5 F (36.4 C) 98 F (36.7 C)  SpO2: 100% 95%    Recent laboratory studies:  Results for orders placed or performed during the hospital encounter of 05/05/23  Glucose, capillary   Collection Time: 05/05/23 12:05 PM  Result Value Ref Range   Glucose-Capillary 102 (H) 70 - 99 mg/dL  Glucose, capillary   Collection Time: 05/05/23  2:16 PM  Result Value Ref Range   Glucose-Capillary 105 (H) 70 - 99 mg/dL  Glucose, capillary   Collection Time: 05/05/23  4:16 PM  Result Value Ref Range   Glucose-Capillary 142 (H) 70 - 99 mg/dL  CBC   Collection Time: 05/06/23  3:22 AM  Result Value Ref Range   WBC 12.7 (H) 4.0 - 10.5 K/uL   RBC 3.62 (L) 3.87 - 5.11 MIL/uL   Hemoglobin 11.1 (L) 12.0 - 15.0 g/dL   HCT 40.9 (L) 81.1 - 91.4 %   MCV 97.2 80.0 - 100.0 fL   MCH 30.7 26.0 - 34.0 pg   MCHC 31.5 30.0 - 36.0 g/dL   RDW 78.2 95.6 - 21.3 %   Platelets 328 150 - 400 K/uL   nRBC 0.0 0.0 - 0.2 %  Basic metabolic panel   Collection Time: 05/06/23  3:22 AM  Result Value Ref Range   Sodium 137 135 - 145 mmol/L   Potassium 4.2 3.5 - 5.1 mmol/L   Chloride 104 98 -  111 mmol/L   CO2 23 22 - 32 mmol/L   Glucose, Bld 271 (H) 70 - 99 mg/dL   BUN 16 6 - 20 mg/dL   Creatinine, Ser 1.61 0.44 - 1.00 mg/dL   Calcium 8.2 (L) 8.9 - 10.3 mg/dL   GFR, Estimated >09 >60 mL/min   Anion gap 10 5 - 15    Discharge Medications:   Allergies as of 05/07/2023       Reactions   Codeine Other (See Comments)   Severe headaches        Medication List     STOP taking these medications    aspirin EC 81 MG tablet Replaced by: aspirin 81 MG chewable tablet       TAKE these medications    aspirin 81 MG chewable tablet Chew 1 tablet (81 mg total) by mouth 2 (two) times daily. Replaces: aspirin EC 81 MG tablet   atorvastatin 10 MG tablet Commonly known as: LIPITOR Take 10 mg by mouth at bedtime.   chlorhexidine 4 % external liquid Commonly known as:  HIBICLENS Apply 15 mLs (1 Application total) topically as directed for 30 doses. Use as directed daily for 5 days every other week for 6 weeks.   estradiol 0.1 mg/24hr patch Commonly known as: CLIMARA - Dosed in mg/24 hr Place 0.1 mg onto the skin every Sunday.   furosemide 20 MG tablet Commonly known as: LASIX Take 20 mg by mouth daily.   ibuprofen 200 MG tablet Commonly known as: ADVIL Take 800 mg by mouth every 6 (six) hours as needed for moderate pain or headache.   levothyroxine 25 MCG tablet Commonly known as: SYNTHROID Take 25 mcg by mouth daily before breakfast.   losartan 100 MG tablet Commonly known as: COZAAR Take 100 mg by mouth daily.   metFORMIN 1000 MG tablet Commonly known as: GLUCOPHAGE Take 1,000 mg by mouth 2 (two) times daily with a meal.   mupirocin ointment 2 % Commonly known as: BACTROBAN Place 1 Application into the nose 2 (two) times daily for 60 doses. Use as directed 2 times daily for 5 days every other week for 6 weeks.   ondansetron 4 MG disintegrating tablet Commonly known as: ZOFRAN-ODT Take 1 tablet (4 mg total) by mouth every 8 (eight) hours as needed.   oxyCODONE 5 MG immediate release tablet Commonly known as: Oxy IR/ROXICODONE Take 1-2 tablets (5-10 mg total) by mouth every 6 (six) hours as needed for moderate pain (pain score 4-6) (pain score 4-6).   pantoprazole 40 MG tablet Commonly known as: PROTONIX TAKE 1 TABLET BY MOUTH DAILY BEFORE BREAKFAST   tiZANidine 4 MG tablet Commonly known as: ZANAFLEX Take 1 tablet (4 mg total) by mouth every 6 (six) hours as needed for muscle spasms.               Durable Medical Equipment  (From admission, onward)           Start     Ordered   05/05/23 1740  DME 3 n 1  Once        05/05/23 1739   05/05/23 1740  DME Walker rolling  Once       Question Answer Comment  Walker: With 5 Inch Wheels   Patient needs a walker to treat with the following condition Status post total left  knee replacement      03 /21/25 1739            Diagnostic Studies: [facility] Knee Left Port  Result Date: 05/05/2023 CLINICAL DATA:  Status post total left knee arthroplasty. EXAM: PORTABLE LEFT KNEE - 1-2 VIEW COMPARISON:  Left knee radiographs 01/09/2023 FINDINGS: Interval total left knee arthroplasty. No perihardware lucency is seen to indicate hardware failure or loosening. Expected postoperative changes including intra-articular and subcutaneous air. Smalljoint effusion. Anterior surgical skin staples. No acute fracture or dislocation. IMPRESSION: Interval total left knee arthroplasty without evidence of hardware failure. Electronically Signed   By: Neita Garnet M.D.   On: 05/05/2023 17:21    Disposition: Discharge disposition: 01-Home or Self Care       Discharge Instructions     Call MD / Call 911   Complete by: As directed    If you experience chest pain or shortness of breath, CALL 911 and be transported to the hospital emergency room.  If you develope a fever above 101 F, pus (white drainage) or increased drainage or redness at the wound, or calf pain, call your surgeon's office.   Constipation Prevention   Complete by: As directed    Drink plenty of fluids.  Prune juice may be helpful.  You may use a stool softener, such as Colace (over the counter) 100 mg twice a day.  Use MiraLax (over the counter) for constipation as needed.   Diet - low sodium heart healthy   Complete by: As directed    Increase activity slowly as tolerated   Complete by: As directed    Post-operative opioid taper instructions:   Complete by: As directed    POST-OPERATIVE OPIOID TAPER INSTRUCTIONS: It is important to wean off of your opioid medication as soon as possible. If you do not need pain medication after your surgery it is ok to stop day one. Opioids include: Codeine, Hydrocodone(Norco, Vicodin), Oxycodone(Percocet, oxycontin) and hydromorphone amongst others.  Long term and even short term use  of opiods can cause: Increased pain response Dependence Constipation Depression Respiratory depression And more.  Withdrawal symptoms can include Flu like symptoms Nausea, vomiting And more Techniques to manage these symptoms Hydrate well Eat regular healthy meals Stay active Use relaxation techniques(deep breathing, meditating, yoga) Do Not substitute Alcohol to help with tapering If you have been on opioids for less than two weeks and do not have pain than it is ok to stop all together.  Plan to wean off of opioids This plan should start within one week post op of your joint replacement. Maintain the same interval or time between taking each dose and first decrease the dose.  Cut the total daily intake of opioids by one tablet each day Next start to increase the time between doses. The last dose that should be eliminated is the evening dose.           Follow-up Information     Kathryne Hitch, MD Follow up in 2 week(s).   Specialty: Orthopedic Surgery Contact information: [address] [place] [address] [phone]                  Signed: Nadara Mustard 05/07/2023, 9:16 AM

## 2023-05-07 NOTE — Progress Notes (Signed)
 Occupational Therapy Evaluation Patient Details Name: Carrie [name] MRN: [id number] DOB: [birth year] Today's Date: 05/07/2023   History of Present Illness   Pt s/p L TKR and with hx of DM and R TKR (23)     Clinical Impressions Pt is s/p TKA. OT education complete. No DME needed.    If plan is discharge home, recommend the following:   A little help with walking and/or transfers;A little help with bathing/dressing/bathroom      Equipment Recommendations   None recommended by OT      Precautions/Restrictions   Precautions Precautions: Knee;Fall Required Braces or Orthoses: Knee Immobilizer - Left Knee Immobilizer - Left: Discontinue once straight leg raise with < 10 degree lag Restrictions Weight Bearing Restrictions Per Provider Order: Yes LLE Weight Bearing Per Provider Order: Weight bearing as tolerated     Mobility Bed Mobility Overal bed mobility: Needs Assistance Bed Mobility: Sit to Supine       Sit to supine: Supervision   General bed mobility comments: use of gait belt to manage L LE    Transfers Overall transfer level: Needs assistance Equipment used: Rolling walker (2 wheels) Transfers: Sit to/from Stand Sit to Stand: Contact guard assist           General transfer comment: cues for LE management and use of UEs to self assist      Balance Overall balance assessment: Needs assistance Sitting-balance support: No upper extremity supported, Feet supported Sitting balance-Leahy Scale: Good     Standing balance support: Bilateral upper extremity supported Standing balance-Leahy Scale: Poor                             ADL either performed or assessed with clinical judgement   ADL Overall ADL's : Needs assistance/impaired     Grooming: Wash/dry face;Standing   Upper Body Bathing: Set up;Sitting   Lower Body Bathing: Minimal assistance;Sit to/from stand;Cueing for safety;Cueing for sequencing   Upper Body Dressing :  Set up;Sitting   Lower Body Dressing: Minimal assistance;Sit to/from stand;Cueing for safety;Cueing for sequencing   Toilet Transfer: Contact guard assist;Rolling walker (2 wheels)             General ADL Comments: upon participating in ADL activity pt did throw up. RN called and zofran given.  Pt left in chair with RN and NT. Pt educated in strategies to A with ADL activity and did have prior knee surgery. No DMe needs      Pertinent Vitals/Pain Pain Assessment Pain Score: 4  Pain Location: L Knee Pain Descriptors / Indicators: Aching, Sore Pain Intervention(s): Limited activity within patient's tolerance, Monitored during session, Repositioned     Extremity/Trunk Assessment Upper Extremity Assessment Upper Extremity Assessment: Overall WFL for tasks assessed           Communication Communication Communication: No apparent difficulties   Cognition Arousal: Alert Behavior During Therapy: WFL for tasks assessed/performed Cognition: No apparent impairments                               Following commands: Intact                  Home Living Family/patient expects to be discharged to:: Private residence Living Arrangements: Spouse/significant other Available Help at Discharge: Family Type of Home: House Home Access: Stairs to enter Secretary/administrator of Steps: 1   Home Layout: One level  Bathroom Shower/Tub: Psychologist, counselling;Tub/shower unit         Home Equipment: Agricultural consultant (2 wheels);Crutches          Prior Functioning/Environment Prior Level of Function : Independent/Modified Independent                            OT Goals(Current goals can be found in the care plan section)   Acute Rehab OT Goals Patient Stated Goal: home OT Goal Formulation: With patient Potential to Achieve Goals: Good   AM-PAC OT "6 Clicks" Daily Activity     Outcome Measure Help from another person eating meals?: None Help from  another person taking care of personal grooming?: None Help from another person toileting, which includes using toliet, bedpan, or urinal?: A Little Help from another person bathing (including washing, rinsing, drying)?: A Little Help from another person to put on and taking off regular upper body clothing?: None Help from another person to put on and taking off regular lower body clothing?: A Little 6 Click Score: 21   End of Session    Activity Tolerance: Patient tolerated treatment well Patient left: in chair;with call bell/phone within reach;with nursing/sitter in room                   Time: 9528-4132 OT Time Calculation (min): 22 min Charges:  OT General Charges $OT Visit: 1 Visit OT Evaluation $OT Eval Low Complexity: 1 Low    Fern Asmar, Metro Kung 05/07/2023, 11:57 AM

## 2023-05-07 NOTE — Progress Notes (Signed)
 Physical Therapy Treatment Patient Details Name: Carrie [name] MRN: [id number] DOB: Feb 27, 1970 Today's Date: 05/07/2023   History of Present Illness Pt s/p L TKR and with hx of DM and R TKR (23)    PT Comments  Pt motivated and progressing well with mobility.  Pt performed HEP with written instruction provided, ambulated increased distance in hall, negotiated step, and up to bathroom for toileting with hand hygiene standing at sink.  Pt hopeful for dc home this date.    If plan is discharge home, recommend the following: A little help with walking and/or transfers;A little help with bathing/dressing/bathroom;Assistance with cooking/housework;Assist for transportation;Help with stairs or ramp for entrance   Can travel by private vehicle        Equipment Recommendations  None recommended by PT    Recommendations for Other Services       Precautions / Restrictions Precautions Precautions: Knee;Fall Required Braces or Orthoses: Knee Immobilizer - Left Knee Immobilizer - Left: Discontinue once straight leg raise with < 10 degree lag Restrictions Weight Bearing Restrictions Per Provider Order: Yes LLE Weight Bearing Per Provider Order: Weight bearing as tolerated     Mobility  Bed Mobility Overal bed mobility: Needs Assistance Bed Mobility: Supine to Sit, Sit to Supine     Supine to sit: Supervision Sit to supine: Supervision   General bed mobility comments: use of gait belt to manage L LE    Transfers Overall transfer level: Needs assistance Equipment used: Rolling walker (2 wheels) Transfers: Sit to/from Stand Sit to Stand: Contact guard assist, Supervision           General transfer comment: min cues for LE management and use of UEs to self assist    Ambulation/Gait Ambulation/Gait assistance: Contact guard assist, Supervision Gait Distance (Feet): 130 Feet (and 15' twice to/from bathroom) Assistive device: Rolling walker (2 wheels) Gait Pattern/deviations:  Step-to pattern, Step-through pattern, Decreased step length - right, Decreased step length - left, Shuffle, Trunk flexed       General Gait Details: cues for sequence, posture and position from Rw   Stairs Stairs: Yes Stairs assistance: Min assist Stair Management: No rails, Step to pattern, Forwards, With walker Number of Stairs: 1 General stair comments: cues for sequence; pt declines to attempt a second time   Wheelchair Mobility     Tilt Bed    Modified Rankin (Stroke Patients Only)       Balance Overall balance assessment: Needs assistance Sitting-balance support: No upper extremity supported, Feet supported Sitting balance-Leahy Scale: Good     Standing balance support: No upper extremity supported Standing balance-Leahy Scale: Fair                              Hotel manager: No apparent difficulties  Cognition Arousal: Alert Behavior During Therapy: WFL for tasks assessed/performed   PT - Cognitive impairments: No apparent impairments                         Following commands: Intact      Cueing Cueing Techniques: Verbal cues  Exercises Total Joint Exercises Ankle Circles/Pumps: AROM, Both, 15 reps, Supine Quad Sets: AROM, Both, 10 reps, Supine Short Arc Quad: AAROM, Left, 10 reps, Supine Heel Slides: AAROM, Left, 15 reps, Supine Straight Leg Raises: AAROM, AROM, Left, 15 reps, Supine    General Comments        Pertinent Vitals/Pain Pain Assessment  Pain Assessment: 0-10 Pain Score: 4  Pain Location: L Knee Pain Descriptors / Indicators: Aching, Sore Pain Intervention(s): Limited activity within patient's tolerance, Monitored during session, Premedicated before session, Ice applied    Home Living Family/patient expects to be discharged to:: Private residence Living Arrangements: Spouse/significant other Available Help at Discharge: Family Type of Home: House Home Access: Stairs to  enter   Secretary/administrator of Steps: 1   Home Layout: One level Home Equipment: Agricultural consultant (2 wheels);Crutches      Prior Function            PT Goals (current goals can now be found in the care plan section) Acute Rehab PT Goals Patient Stated Goal: Regain IND PT Goal Formulation: With patient Time For Goal Achievement: 05/13/23 Potential to Achieve Goals: Good Progress towards PT goals: Progressing toward goals    Frequency    7X/week      PT Plan      Co-evaluation              AM-PAC PT "6 Clicks" Mobility   Outcome Measure  Help needed turning from your back to your side while in a flat bed without using bedrails?: A Little Help needed moving from lying on your back to sitting on the side of a flat bed without using bedrails?: A Little Help needed moving to and from a bed to a chair (including a wheelchair)?: A Little Help needed standing up from a chair using your arms (e.g., wheelchair or bedside chair)?: A Little Help needed to walk in hospital room?: A Little Help needed climbing 3-5 steps with a railing? : A Little 6 Click Score: 18    End of Session Equipment Utilized During Treatment: Gait belt Activity Tolerance: Patient tolerated treatment well Patient left: in bed;with call bell/phone within reach;with bed alarm set Nurse Communication: Mobility status PT Visit Diagnosis: Difficulty in walking, not elsewhere classified (R26.2)     Time: 8295-6213 PT Time Calculation (min) (ACUTE ONLY): 48 min  Charges:    $Gait Training: 8-22 mins $Therapeutic Exercise: 8-22 mins $Therapeutic Activity: 8-22 mins PT General Charges $$ ACUTE PT VISIT: 1 Visit                     Mauro Kaufmann PT Acute Rehabilitation Services Pager [phone] Office [phone]    Allie Gerhold 05/07/2023, 2:00 PM

## 2023-05-07 NOTE — Progress Notes (Signed)
 Patient was having chills, temp. 98.3. I gave her a warm blanket, rechecked her temp 101.8, removed the blanket rechecked temp 99.1, HR-117, rechecked HR- 105. Her cheeks are red-pink, she says she feels more generalized weakness than this am. She was not up for round 2 with OT. She requested to lay back down.   Tylenol given, Temp recheck 99.3,  oxygen saturation 99-91, resting, 2L [place] applied.  MD on call notified and says ok for Patient to stay.

## 2023-05-07 NOTE — Plan of Care (Signed)
  Problem: Education: Goal: Knowledge of General Education information will improve Description: Including pain rating scale, medication(s)/side effects and non-pharmacologic comfort measures Outcome: Progressing   Problem: Health Behavior/Discharge Planning: Goal: Ability to manage health-related needs will improve Outcome: Progressing   Problem: Clinical Measurements: Goal: Ability to maintain clinical measurements within normal limits will improve Outcome: Progressing Goal: Will remain free from infection Outcome: Progressing   Problem: Elimination: Goal: Will not experience complications related to urinary retention Outcome: Progressing   Problem: Pain Managment: Goal: General experience of comfort will improve and/or be controlled Outcome: Progressing

## 2023-05-08 ENCOUNTER — Encounter (HOSPITAL_COMMUNITY): Payer: Self-pay | Admitting: Orthopaedic Surgery

## 2023-05-08 DIAGNOSIS — E039 Hypothyroidism, unspecified: Secondary | ICD-10-CM | POA: Diagnosis not present

## 2023-05-08 DIAGNOSIS — Z7984 Long term (current) use of oral hypoglycemic drugs: Secondary | ICD-10-CM | POA: Diagnosis not present

## 2023-05-08 DIAGNOSIS — Z79899 Other long term (current) drug therapy: Secondary | ICD-10-CM | POA: Diagnosis not present

## 2023-05-08 DIAGNOSIS — Z7982 Long term (current) use of aspirin: Secondary | ICD-10-CM | POA: Diagnosis not present

## 2023-05-08 DIAGNOSIS — Z96651 Presence of right artificial knee joint: Secondary | ICD-10-CM | POA: Diagnosis not present

## 2023-05-08 DIAGNOSIS — M1712 Unilateral primary osteoarthritis, left knee: Secondary | ICD-10-CM | POA: Diagnosis not present

## 2023-05-08 DIAGNOSIS — E119 Type 2 diabetes mellitus without complications: Secondary | ICD-10-CM | POA: Diagnosis not present

## 2023-05-08 DIAGNOSIS — I10 Essential (primary) hypertension: Secondary | ICD-10-CM | POA: Diagnosis not present

## 2023-05-08 NOTE — Progress Notes (Signed)
 Patient seen by PT, per PT patient ambulated well, no complaints of dizziness/nausea, per patient No need for OT.

## 2023-05-08 NOTE — Progress Notes (Signed)
 Physical Therapy Treatment Patient Details Name: Carrie [name] MRN: [id number] DOB: 1970-12-01 Today's Date: 05/08/2023   History of Present Illness Pt s/p L TKR and with hx of DM and R TKR (23)    PT Comments  Pt continues cooperative, progressing with mobility, and with no c/o dizziness with activity but "very tired".  Pt received exiting bathroom with CNA and ambulated in hall, on return to room back to bed Supervision and performed therex program - written instruction provided.  Pt eager for dc home this date.    If plan is discharge home, recommend the following: A little help with walking and/or transfers;A little help with bathing/dressing/bathroom;Assistance with cooking/housework;Assist for transportation;Help with stairs or ramp for entrance   Can travel by private vehicle        Equipment Recommendations  None recommended by PT    Recommendations for Other Services       Precautions / Restrictions Precautions Precautions: Knee;Fall Recall of Precautions/Restrictions: Intact Required Braces or Orthoses: Knee Immobilizer - Left Knee Immobilizer - Left: Discontinue once straight leg raise with < 10 degree lag Restrictions Weight Bearing Restrictions Per Provider Order: Yes     Mobility  Bed Mobility Overal bed mobility: Needs Assistance Bed Mobility: Sit to Supine       Sit to supine: Supervision   General bed mobility comments: use of gait belt to manage L LE    Transfers Overall transfer level: Needs assistance Equipment used: Rolling walker (2 wheels) Transfers: Sit to/from Stand Sit to Stand: Contact guard assist, Supervision           General transfer comment: min cues for LE management and use of UEs to self assist    Ambulation/Gait Ambulation/Gait assistance: Contact guard assist, Supervision Gait Distance (Feet): 100 Feet Assistive device: Rolling walker (2 wheels) Gait Pattern/deviations: Step-to pattern, Step-through pattern, Decreased  step length - right, Decreased step length - left, Shuffle, Trunk flexed Gait velocity: decr     General Gait Details: min cues for sequence, posture and position from Rw   Stairs Stairs:  (Pt states comfortable with step after practice yesterday; verbally reviewed sequence)           Wheelchair Mobility     Tilt Bed    Modified Rankin (Stroke Patients Only)       Balance Overall balance assessment: Needs assistance Sitting-balance support: No upper extremity supported, Feet supported Sitting balance-Leahy Scale: Good     Standing balance support: No upper extremity supported Standing balance-Leahy Scale: Fair                              Hotel manager: No apparent difficulties  Cognition Arousal: Alert Behavior During Therapy: WFL for tasks assessed/performed   PT - Cognitive impairments: No apparent impairments                         Following commands: Intact      Cueing Cueing Techniques: Verbal cues  Exercises Total Joint Exercises Ankle Circles/Pumps: AROM, Both, 15 reps, Supine Quad Sets: AROM, Both, 10 reps, Supine Short Arc Quad: AAROM, Left, Supine, 10 reps Heel Slides: AAROM, Left, 15 reps, Supine Straight Leg Raises: AAROM, Left, 15 reps, Supine    General Comments        Pertinent Vitals/Pain Pain Assessment Pain Assessment: 0-10 Pain Score: 3  Pain Location: L Knee Pain Descriptors / Indicators: Aching, Sore Pain  Intervention(s): Limited activity within patient's tolerance, Monitored during session, Premedicated before session, Ice applied    Home Living                          Prior Function            PT Goals (current goals can now be found in the care plan section) Acute Rehab PT Goals Patient Stated Goal: Regain IND PT Goal Formulation: With patient Time For Goal Achievement: 05/13/23 Potential to Achieve Goals: Good Progress towards PT goals: Progressing  toward goals    Frequency    7X/week      PT Plan      Co-evaluation              AM-PAC PT "6 Clicks" Mobility   Outcome Measure  Help needed turning from your back to your side while in a flat bed without using bedrails?: A Little Help needed moving from lying on your back to sitting on the side of a flat bed without using bedrails?: A Little Help needed moving to and from a bed to a chair (including a wheelchair)?: A Little Help needed standing up from a chair using your arms (e.g., wheelchair or bedside chair)?: A Little Help needed to walk in hospital room?: A Little Help needed climbing 3-5 steps with a railing? : A Little 6 Click Score: 18    End of Session Equipment Utilized During Treatment: Gait belt Activity Tolerance: Patient tolerated treatment well;Patient limited by fatigue Patient left: in bed;with call bell/phone within reach;with bed alarm set Nurse Communication: Mobility status PT Visit Diagnosis: Difficulty in walking, not elsewhere classified (R26.2)     Time: 1610-9604 PT Time Calculation (min) (ACUTE ONLY): 28 min  Charges:    $Gait Training: 8-22 mins $Therapeutic Exercise: 8-22 mins PT General Charges $$ ACUTE PT VISIT: 1 Visit                     Mauro Kaufmann PT Acute Rehabilitation Services Pager [phone] Office [phone]    Isaiah Torok 05/08/2023, 10:41 AM

## 2023-05-08 NOTE — Plan of Care (Signed)

## 2023-05-08 NOTE — Progress Notes (Signed)
AVS given and explained to patient, awaiting for ride.  

## 2023-05-08 NOTE — Progress Notes (Signed)
 Patient ID: Carrie [name], female   DOB: January 07, 1971, 53 y.o.   MRN: [id number] Looks better overall today and reports feeling better.  Left knee stable.  BP stable.  Can be discharged to home today.

## 2023-05-11 ENCOUNTER — Encounter: Payer: Self-pay | Admitting: Orthopaedic Surgery

## 2023-05-11 ENCOUNTER — Other Ambulatory Visit: Payer: Self-pay | Admitting: *Deleted

## 2023-05-11 DIAGNOSIS — Z96652 Presence of left artificial knee joint: Secondary | ICD-10-CM

## 2023-05-11 DIAGNOSIS — M1712 Unilateral primary osteoarthritis, left knee: Secondary | ICD-10-CM

## 2023-05-12 ENCOUNTER — Other Ambulatory Visit: Payer: Self-pay

## 2023-05-12 ENCOUNTER — Encounter (HOSPITAL_COMMUNITY): Payer: Self-pay

## 2023-05-12 ENCOUNTER — Ambulatory Visit (HOSPITAL_COMMUNITY): Attending: Orthopaedic Surgery

## 2023-05-12 DIAGNOSIS — Z96652 Presence of left artificial knee joint: Secondary | ICD-10-CM | POA: Diagnosis not present

## 2023-05-12 DIAGNOSIS — G8918 Other acute postprocedural pain: Secondary | ICD-10-CM | POA: Insufficient documentation

## 2023-05-12 DIAGNOSIS — M25562 Pain in left knee: Secondary | ICD-10-CM | POA: Insufficient documentation

## 2023-05-12 DIAGNOSIS — M1712 Unilateral primary osteoarthritis, left knee: Secondary | ICD-10-CM | POA: Diagnosis not present

## 2023-05-12 NOTE — Therapy (Signed)
 OUTPATIENT PHYSICAL THERAPY LOWER EXTREMITY EVALUATION   Patient Name: Carrie [name] MRN: [id number] DOB:10-22-1970, 53 y.o., female Today's Date: 05/12/2023  END OF SESSION:  PT End of Session - 05/12/23 1145     Visit Number 1    Number of Visits 12    Date for PT Re-Evaluation 06/23/23    Authorization Type BC[employer]    PT Start Time 1103    PT Stop Time 1141    PT Time Calculation (min) 38 min    Activity Tolerance Patient tolerated treatment well;Patient limited by pain    Behavior During Therapy [facility] for tasks assessed/performed             Past Medical History:  Diagnosis Date   Arthritis    Arthrofibrosis of right total knee arthroplasty, subsequent encounter 05/20/2021   Diabetes mellitus without complication (HCC)    type 2   Essential hypertension    GERD (gastroesophageal reflux disease)    Headache    migraine   Hypothyroidism    Mixed hyperlipidemia    PONV (postoperative nausea and vomiting)    Really Sick!!! Had to stay in the hospital   Past Surgical History:  Procedure Laterality Date   ABDOMINAL HYSTERECTOMY     BIOPSY  04/25/2022   Procedure: BIOPSY;  Surgeon: Corbin Ade, MD;  [facility];  Service: Endoscopy;;   COLONOSCOPY WITH PROPOFOL N/A 04/25/2022   Procedure: COLONOSCOPY WITH PROPOFOL;  Surgeon: Corbin Ade, MD;  Location: [facility];  Service: Endoscopy;  Laterality: N/A;  8:45 am, asa 2   ESOPHAGOGASTRODUODENOSCOPY (EGD) WITH PROPOFOL N/A 04/25/2022   Procedure: ESOPHAGOGASTRODUODENOSCOPY (EGD) WITH PROPOFOL;  Surgeon: Corbin Ade, MD;  [facility];  Service: Endoscopy;  Laterality: N/A;   HARDWARE REMOVAL Right 04/02/2021   Procedure: Right Knee HARDWARE REMOVAL;  Surgeon: Kathryne Hitch, MD;  Location: [facility];  Service: Orthopedics;  Laterality: Right;   KNEE ARTHROSCOPY  02/15/2007   KNEE CLOSED REDUCTION Right 05/20/2021   Procedure: CLOSED MANIPULATION RIGHT KNEE;  Surgeon: Kathryne Hitch, MD;  Location: [facility];  Service: Orthopedics;  Laterality: Right;   MALONEY DILATION N/A 04/25/2022   Procedure: Elease Hashimoto DILATION;  Surgeon: Corbin Ade, MD;  [facility];  Service: Endoscopy;  Laterality: N/A;   MASS EXCISION  02/11/2011   Procedure: EXCISION MASS;  Surgeon: Kerrin Champagne, MD;  Location: [facility];  Service: Orthopedics;  Laterality: Right;  resection of neuroma right infrapatellar medial knee   PATELLA ARTHROPLASTY     rt knee-   POLYPECTOMY  04/25/2022   Procedure: POLYPECTOMY;  Surgeon: Corbin Ade, MD;  [facility]: [facility];  Service: Endoscopy;;   REDUCTION MAMMAPLASTY Bilateral 2017   TOTAL KNEE ARTHROPLASTY  02/14/2009   partial-Gholson reg    TOTAL KNEE ARTHROPLASTY Right 04/02/2021   Procedure: Right TOTAL KNEE ARTHROPLASTY;  Surgeon: Kathryne Hitch, MD;  Location: [facility];  Service: Orthopedics;  Laterality: Right;   TOTAL KNEE ARTHROPLASTY Left 05/05/2023   Procedure: LEFT TOTAL KNEE ARTHROPLASTY;  Surgeon: Kathryne Hitch, MD;  Location: [facility];  Service: Orthopedics;  Laterality: Left;   TUBAL LIGATION     Patient Active Problem List   Diagnosis Date Noted   Status post total left knee replacement 05/05/2023   Unilateral primary osteoarthritis, left knee 05/04/2023   Encounter for screening colonoscopy 04/04/2022   Esophageal dysphagia 04/04/2022   Constipation 04/04/2022   Arthrofibrosis  of right total knee arthroplasty, subsequent encounter 05/20/2021   Pain in right knee 04/19/2021   Stiffness of right knee, not elsewhere classified 04/19/2021   Difficulty in walking, not elsewhere classified 04/19/2021   Status post total knee replacement, right 04/02/2021   Allergic rhinitis due to pollen 06/22/2020   Eosinophilic esophagitis 06/22/2020   Rash and other nonspecific skin eruption 06/22/2020   AC joint arthropathy 01/04/2017   Acute pain of right shoulder  11/15/2016   Other derangements of patella, unspecified knee 09/02/2015   Recurrent dislocation of right patella 09/02/2015   Breast hypertrophy in female 05/13/2015   Neuroma of lower extremity 02/11/2011    Class: Chronic    PCP: Avis Epley, PA-C   REFERRING PROVIDER: Kathryne Hitch, MD  REFERRING DIAG: [phone] (ICD-10-CM) - Status post total left knee replacement M17.12 (ICD-10-CM) - Unilateral primary osteoarthritis, left knee  THERAPY DIAG:  Status post total left knee replacement  Acute postoperative pain of left knee  Rationale for Evaluation and Treatment: Rehabilitation  ONSET DATE: 05/05/2023  SUBJECTIVE:   SUBJECTIVE STATEMENT: Pt reports extended stay in hospital following sx due to BP changes and having a temperature. Pt reports being independent with all household and community ambulation with no need for AD but was in significant amount of pain. Pt would like to be able to decrease the pain level and return to being able to "get up and go." Pt reports taking compression sleeve off this morning due to pain.  PERTINENT HISTORY: Surgery was a week ago today, 05/05/23. Pt has hx of R TKA 2 years ago.  PAIN:  Are you having pain? Yes: NPRS scale: 4/10 Pain location: left knee all around Pain description: dull  Aggravating factors: walking, standing Relieving factors: sitting, iceman  PRECAUTIONS: Knee  RED FLAGS: None   WEIGHT BEARING RESTRICTIONS: No  FALLS:  Has patient fallen in last 6 months? No  LIVING ENVIRONMENT: Lives with: lives with their spouse and lives with their daughter Lives in: House/apartment Stairs: Yes: External: 1 steps; none Has following equipment at home: Single point cane and Crutches  OCCUPATION: tax office  PLOF: Independent with household mobility without device, Independent with community mobility without device, and pain with stairs  PATIENT GOALS: get rid of pain, wants to get her life back, walk dogs,  wants to get up and go  NEXT MD VISIT: 05/18/23  OBJECTIVE:  Note: Objective measures were completed at Evaluation unless otherwise noted.  DIAGNOSTIC FINDINGS:  Radiographs 3/21: CLINICAL DATA:  Status post total left knee arthroplasty.   EXAM: PORTABLE LEFT KNEE - 1-2 VIEW   COMPARISON:  Left knee radiographs 01/09/2023   FINDINGS: Interval total left knee arthroplasty. No perihardware lucency is seen to indicate hardware failure or loosening. Expected postoperative changes including intra-articular and subcutaneous air. Smalljoint effusion. Anterior surgical skin staples. No acute fracture or dislocation.   IMPRESSION: Interval total left knee arthroplasty without evidence of hardware failure.  PATIENT SURVEYS:  LEFS TBA next session  COGNITION: Overall cognitive status: Within functional limits for tasks assessed     SENSATION: [facility] Not tested  EDEMA:  Pt demonstrates increased swelling in LLE with pt reporting, she took compression sleeve off this morning due to pain. Pt denies throbbing pain and is educated on the risk of DVT and importance of inflammation control.  PALPATION: Pain/tenderness/soreness reported during palpation of L knee and mid calf region.   LOWER EXTREMITY ROM:  Active ROM Right eval Left eval  Hip flexion  Hip extension    Hip abduction    Hip adduction    Hip internal rotation    Hip external rotation    Knee flexion 109 60  Knee extension 4 15  Ankle dorsiflexion    Ankle plantarflexion    Ankle inversion    Ankle eversion     (Blank rows = not tested)  LOWER EXTREMITY MMT: not formally assessed upon eval due to pain  MMT Right eval Left eval  Hip flexion WFL   Hip extension [facility]   Hip abduction    Hip adduction    Hip internal rotation    Hip external rotation    Knee flexion WFL   Knee extension WFL   Ankle dorsiflexion WFL   Ankle plantarflexion WFL   Ankle inversion    Ankle eversion     (Blank rows = not  tested)   FUNCTIONAL TESTS:  5 times sit to stand: TBA 2 minute walk test: TBA  GAIT: Distance walked: 100 Assistive device utilized: Environmental consultant - 2 wheeled Level of assistance: Modified independence Comments: decreased speed, decreased stride length and step length bilaterally, antalgic                                                                                                                                TREATMENT DATE:  05/12/2023  Vitals: BP: 145/87 HR: 102  Therapeutic Exercise: Quad sets, 1 set of 10 reps, 3 second holds Ankle pumps, 2 sets of 10 reps Hip abductions, 1 set of 10 reps, pt cued for decreased ROM for pain management Heel Slides, green strap, 2 set of 10 reps, pt cued for max tolerated flexion     PATIENT EDUCATION:  Education details: Pt was educated on findings of PT evaluation, prognosis, frequency of therapy visits and rationale, attendance policy, and HEP if given.   Person educated: Patient Education method: Explanation Education comprehension: verbalized understanding and needs further education  HOME EXERCISE PROGRAM: Access Code: [email or web address] URL: [email or web address] Date: 05/12/2023 Prepared by: Luz Lex  Exercises - Supine Ankle Pumps - 3 x daily - 1 sets - 15-20 reps - Supine Hip Abduction - 3 x daily - 1 sets - 15-20 reps - Supine Quad Set - 3 x daily - 1 sets - 15-20 reps - 3 sec hold - Supine Heel Slide with Strap - 3 x daily - 3 sets - 15-20 reps - 3 sec hold   ASSESSMENT:  CLINICAL IMPRESSION: Patient is a 53 y.o. female who was seen today for physical therapy evaluation and treatment for status post Left TKA, 05/05/2023. Pt demonstrates limited left knee ROM and strength, limited by pain. Pt demonstrates difficulty with ambulation and requires RW. Pt demonstrates decreased gait speed and efficiency due to pain and RW height, RW was adjusted and pt was educated on DME. Patient would continue to benefit from skilled  physical therapy to increase pain free L knee ROM, increased LLE strength  for improved quality of life, community ambulation and continued progress towards therapy goals.   OBJECTIVE IMPAIRMENTS: Abnormal gait, decreased activity tolerance, decreased balance, decreased coordination, decreased knowledge of use of DME, decreased mobility, difficulty walking, decreased ROM, decreased strength, increased edema, and pain.   ACTIVITY LIMITATIONS: carrying, bending, standing, squatting, sleeping, stairs, and bed mobility  PARTICIPATION LIMITATIONS: meal prep, cleaning, laundry, shopping, community activity, occupation, and yard work  PERSONAL FACTORS: Age, Past/current experiences, and 1 comorbidity: had to stay a bit longer in the hospital following sx due to temperature and BP changes  are also affecting patient's functional outcome.   REHAB POTENTIAL: Good  CLINICAL DECISION MAKING: Stable/uncomplicated  EVALUATION COMPLEXITY: Low   GOALS: Goals reviewed with patient? No  SHORT TERM GOALS: Target date: 06/02/23  Patient will demonstrate evidence of independence with individualized HEP and will report compliance for at least 3 days per week for optimized progression towards remaining therapy goals. Baseline: following post op exercises Goal status: INITIAL  2.  Patient will report a decrease in pain level during community ambulation by at least 2 points for improved quality of life. Baseline: 4/10 Goal status: INITIAL     LONG TERM GOALS: Target date: 06/23/23  Pt will demonstrate a an increase of at least 9 points on the LEFS for improved performance of community ambulation and ADL. Baseline: TBA next session Goal status: INITIAL  2.  Pt will improve 2 MWT to at least 578 feet in order to demonstrate age norms for functional ambulatory capacity in community setting.  Baseline: TBA as pt tolerates post op soreness Goal status: INITIAL  3.  Pt will demonstrate WFL ROM (flexion and  extension) in left knee, for increased mobility and maximal efficiency of gait cycle during ambulation. Baseline: see objective Goal status: INITIAL  4.  Pt will demonstrate at least 4-/5 MMT for left lower extremity for increased strength during ADL and community ambulation. Baseline: TBA when pain reduces Goal status: INITIAL     PLAN:  PT FREQUENCY: 2x/week  PT DURATION: 6 weeks  PLANNED INTERVENTIONS: 97110-Therapeutic exercises, 97530- Therapeutic activity, 97112- Neuromuscular re-education, 97535- Self Care, and 95284- Manual therapy  PLAN FOR NEXT SESSION: LEFS, , STS test, progress LLE strengthening and mobility   Luz Lex, PT, DPT [facility] Office: [phone] 12:13 PM, 05/12/23

## 2023-05-15 ENCOUNTER — Encounter (HOSPITAL_COMMUNITY): Payer: Self-pay

## 2023-05-15 ENCOUNTER — Ambulatory Visit (HOSPITAL_COMMUNITY)

## 2023-05-15 DIAGNOSIS — M25562 Pain in left knee: Secondary | ICD-10-CM

## 2023-05-15 DIAGNOSIS — Z96652 Presence of left artificial knee joint: Secondary | ICD-10-CM | POA: Diagnosis not present

## 2023-05-15 DIAGNOSIS — M1712 Unilateral primary osteoarthritis, left knee: Secondary | ICD-10-CM | POA: Diagnosis not present

## 2023-05-15 DIAGNOSIS — G8918 Other acute postprocedural pain: Secondary | ICD-10-CM | POA: Diagnosis not present

## 2023-05-15 NOTE — Therapy (Signed)
 OUTPATIENT PHYSICAL THERAPY LOWER EXTREMITY TREATMENT   Patient Name: Carrie [name] MRN: [id number] DOB:Oct 24, 1970, 53 y.o., female Today's Date: 05/15/2023  END OF SESSION:  PT End of Session - 05/15/23 0853     Visit Number 2    Number of Visits 12    Date for PT Re-Evaluation 06/23/23    Authorization Type [employer], no auth required    PT Start Time 0804    PT Stop Time 0846    PT Time Calculation (min) 42 min    Equipment Utilized During Treatment Gait belt    Activity Tolerance Patient tolerated treatment well;Patient limited by pain    Behavior During Therapy WFL for tasks assessed/performed              Past Medical History:  Diagnosis Date   Arthritis    Arthrofibrosis of right total knee arthroplasty, subsequent encounter 05/20/2021   Diabetes mellitus without complication (HCC)    type 2   Essential hypertension    GERD (gastroesophageal reflux disease)    Headache    migraine   Hypothyroidism    Mixed hyperlipidemia    PONV (postoperative nausea and vomiting)    Really Sick!!! Had to stay in the hospital   Past Surgical History:  Procedure Laterality Date   ABDOMINAL HYSTERECTOMY     BIOPSY  04/25/2022   Procedure: BIOPSY;  Surgeon: Corbin Ade, MD;  Location: [facility];  Service: Endoscopy;;   COLONOSCOPY WITH PROPOFOL N/A 04/25/2022   Procedure: COLONOSCOPY WITH PROPOFOL;  Surgeon: Corbin Ade, MD;  Location: [facility];  Service: Endoscopy;  Laterality: N/A;  8:45 am, asa 2   ESOPHAGOGASTRODUODENOSCOPY (EGD) WITH PROPOFOL N/A 04/25/2022   Procedure: ESOPHAGOGASTRODUODENOSCOPY (EGD) WITH PROPOFOL;  Surgeon: Corbin Ade, MD;  Location: [facility];  Service: Endoscopy;  Laterality: N/A;   HARDWARE REMOVAL Right 04/02/2021   Procedure: Right Knee HARDWARE REMOVAL;  Surgeon: Kathryne Hitch, MD;  Location: [facility];  Service: Orthopedics;  Laterality: Right;   KNEE ARTHROSCOPY  02/15/2007   KNEE CLOSED REDUCTION Right  05/20/2021   Procedure: CLOSED MANIPULATION RIGHT KNEE;  Surgeon: Kathryne Hitch, MD;  Location: [facility];  Service: Orthopedics;  Laterality: Right;   MALONEY DILATION N/A 04/25/2022   Procedure: Elease Hashimoto DILATION;  Surgeon: Corbin Ade, MD;  Location: [facility];  Service: Endoscopy;  Laterality: N/A;   MASS EXCISION  02/11/2011   Procedure: EXCISION MASS;  Surgeon: Kerrin Champagne, MD;  Location: [facility];  Service: Orthopedics;  Laterality: Right;  resection of neuroma right infrapatellar medial knee   PATELLA ARTHROPLASTY     rt knee-   POLYPECTOMY  04/25/2022   Procedure: POLYPECTOMY;  Surgeon: Corbin Ade, MD;  Location: [facility];  Service: Endoscopy;;   REDUCTION MAMMAPLASTY Bilateral 2017   TOTAL KNEE ARTHROPLASTY  02/14/2009   partial- reg    TOTAL KNEE ARTHROPLASTY Right 04/02/2021   Procedure: Right TOTAL KNEE ARTHROPLASTY;  Surgeon: Kathryne Hitch, MD;  Location: [facility];  Service: Orthopedics;  Laterality: Right;   TOTAL KNEE ARTHROPLASTY Left 05/05/2023   Procedure: LEFT TOTAL KNEE ARTHROPLASTY;  Surgeon: Kathryne Hitch, MD;  Location: [facility];  Service: Orthopedics;  Laterality: Left;   TUBAL LIGATION     Patient Active Problem List   Diagnosis Date Noted   Status post total left knee replacement 05/05/2023   Unilateral primary osteoarthritis, left knee 05/04/2023   Encounter for screening colonoscopy  04/04/2022   Esophageal dysphagia 04/04/2022   Constipation 04/04/2022   Arthrofibrosis of right total knee arthroplasty, subsequent encounter 05/20/2021   Pain in right knee 04/19/2021   Stiffness of right knee, not elsewhere classified 04/19/2021   Difficulty in walking, not elsewhere classified 04/19/2021   Status post total knee replacement, right 04/02/2021   Allergic rhinitis due to pollen 06/22/2020   Eosinophilic esophagitis 06/22/2020   Rash and other nonspecific skin eruption  06/22/2020   AC joint arthropathy 01/04/2017   Acute pain of right shoulder 11/15/2016   Other derangements of patella, unspecified knee 09/02/2015   Recurrent dislocation of right patella 09/02/2015   Breast hypertrophy in female 05/13/2015   Neuroma of lower extremity 02/11/2011    Class: Chronic    PCP: Avis Epley, PA-C   REFERRING PROVIDER: Kathryne Hitch, MD Next apt: 05/18/23  REFERRING DIAG: Z61.096 (ICD-10-CM) - Status post total left knee replacement M17.12 (ICD-10-CM) - Unilateral primary osteoarthritis, left knee  THERAPY DIAG:  Acute postoperative pain of left knee  Status post total left knee replacement  Rationale for Evaluation and Treatment: Rehabilitation  ONSET DATE: 05/05/2023  SUBJECTIVE:   SUBJECTIVE STATEMENT: 05/15/23:  Knee is stiff this morning, current pain scale 3/10.  Has began HEP without questions.  Eval:  Pt reports extended stay in hospital following sx due to BP changes and having a temperature. Pt reports being independent with all household and community ambulation with no need for AD but was in significant amount of pain. Pt would like to be able to decrease the pain level and return to being able to "get up and go." Pt reports taking compression sleeve off this morning due to pain.  PERTINENT HISTORY: Surgery was a week ago today, 05/05/23. Pt has hx of R TKA 2 years ago.  PAIN:  Are you having pain? Yes: NPRS scale: 3/10 Pain location: left knee all around Pain description: dull  Aggravating factors: walking, standing Relieving factors: sitting, iceman  PRECAUTIONS: Knee  RED FLAGS: None   WEIGHT BEARING RESTRICTIONS: No  FALLS:  Has patient fallen in last 6 months? No  LIVING ENVIRONMENT: Lives with: lives with their spouse and lives with their daughter Lives in: House/apartment Stairs: Yes: External: 1 steps; none Has following equipment at home: Single point cane and Crutches  OCCUPATION: tax  office  PLOF: Independent with household mobility without device, Independent with community mobility without device, and pain with stairs  PATIENT GOALS: get rid of pain, wants to get her life back, walk dogs, wants to get up and go  NEXT MD VISIT: 05/18/23  OBJECTIVE:  Note: Objective measures were completed at Evaluation unless otherwise noted.  DIAGNOSTIC FINDINGS:  Radiographs 3/21: CLINICAL DATA:  Status post total left knee arthroplasty.   EXAM: PORTABLE LEFT KNEE - 1-2 VIEW   COMPARISON:  Left knee radiographs 01/09/2023   FINDINGS: Interval total left knee arthroplasty. No perihardware lucency is seen to indicate hardware failure or loosening. Expected postoperative changes including intra-articular and subcutaneous air. Smalljoint effusion. Anterior surgical skin staples. No acute fracture or dislocation.   IMPRESSION: Interval total left knee arthroplasty without evidence of hardware failure.  PATIENT SURVEYS:  LEFS 30/80 = 37.5%  COGNITION: Overall cognitive status: Within functional limits for tasks assessed     SENSATION: [facility] Not tested  EDEMA:  Pt demonstrates increased swelling in LLE with pt reporting, she took compression sleeve off this morning due to pain. Pt denies throbbing pain and is educated on the  risk of DVT and importance of inflammation control.  PALPATION: Pain/tenderness/soreness reported during palpation of L knee and mid calf region.   LOWER EXTREMITY ROM:  Active ROM Right eval Left eval  Hip flexion    Hip extension    Hip abduction    Hip adduction    Hip internal rotation    Hip external rotation    Knee flexion 109 60  Knee extension 4 15  Ankle dorsiflexion    Ankle plantarflexion    Ankle inversion    Ankle eversion     (Blank rows = not tested)  LOWER EXTREMITY MMT: not formally assessed upon eval due to pain  MMT Right eval Left eval  Hip flexion WFL   Hip extension [facility]   Hip abduction    Hip adduction     Hip internal rotation    Hip external rotation    Knee flexion WFL   Knee extension WFL   Ankle dorsiflexion WFL   Ankle plantarflexion WFL   Ankle inversion    Ankle eversion     (Blank rows = not tested)   FUNCTIONAL TESTS:  5 times sit to stand: TBA 2 minute walk test: 226 ft with RW with slow cadence, decreased stride length and step length bilaterally, antalgic  30 second sit to stand 7 times in split stance with UE assistance from chair GAIT: Distance walked: 100 Assistive device utilized: Environmental consultant - 2 wheeled Level of assistance: Modified independence Comments: decreased speed, decreased stride length and step length bilaterally, antalgic                                                                                                                                TREATMENT DATE:  05/15/23: Reviewed goals 226 with RW  30 second sit to stand 7 times in split stance with UE assistance from chair  Supine: Quad sets 10x 5" SAQ: 10x 5" Heel slides 10x 5" AROM: 11-61 degrees  Manual: Decongestive massage with LE elevated Bike rocking seat 6 5'  05/12/2023  Vitals: BP: 145/87 HR: 102  Therapeutic Exercise: Quad sets, 1 set of 10 reps, 3 second holds Ankle pumps, 2 sets of 10 reps Hip abductions, 1 set of 10 reps, pt cued for decreased ROM for pain management Heel Slides, green strap, 2 set of 10 reps, pt cued for max tolerated flexion     PATIENT EDUCATION:  Education details: Pt was educated on findings of PT evaluation, prognosis, frequency of therapy visits and rationale, attendance policy, and HEP if given.   Person educated: Patient Education method: Explanation Education comprehension: verbalized understanding and needs further education  HOME EXERCISE PROGRAM: Access Code: [email or web address] URL: [email or web address] Date: 05/12/2023 Prepared by: Luz Lex  Exercises - Supine Ankle Pumps - 3 x daily - 1 sets - 15-20 reps - Supine Hip  Abduction - 3 x daily - 1 sets - 15-20 reps - Supine Quad Set - 3  x daily - 1 sets - 15-20 reps - 3 sec hold - Supine Heel Slide with Strap - 3 x daily - 3 sets - 15-20 reps - 3 sec hold   ASSESSMENT:  CLINICAL IMPRESSION: 05/15/23:  Reviewed goals and importance of HEP compliance for maximal benefits.  Session focus on knee mobility and manual techniques for edema control.  Pt able to recall and demonstrate appropriate mechanics with current exercise program.  Added SAQ to improve quad proximal strengthening.  Reviewed RICE technqiues for pain and edema control.  AROM 11-61 degrees.  EOS on bike rocking.  Eval:Patient is a 53 y.o. female who was seen today for physical therapy evaluation and treatment for status post Left TKA, 05/05/2023. Pt demonstrates limited left knee ROM and strength, limited by pain. Pt demonstrates difficulty with ambulation and requires RW. Pt demonstrates decreased gait speed and efficiency due to pain and RW height, RW was adjusted and pt was educated on DME. Patient would continue to benefit from skilled physical therapy to increase pain free L knee ROM, increased LLE strength for improved quality of life, community ambulation and continued progress towards therapy goals.   OBJECTIVE IMPAIRMENTS: Abnormal gait, decreased activity tolerance, decreased balance, decreased coordination, decreased knowledge of use of DME, decreased mobility, difficulty walking, decreased ROM, decreased strength, increased edema, and pain.   ACTIVITY LIMITATIONS: carrying, bending, standing, squatting, sleeping, stairs, and bed mobility  PARTICIPATION LIMITATIONS: meal prep, cleaning, laundry, shopping, community activity, occupation, and yard work  PERSONAL FACTORS: Age, Past/current experiences, and 1 comorbidity: had to stay a bit longer in the hospital following sx due to temperature and BP changes  are also affecting patient's functional outcome.   REHAB POTENTIAL: Good  CLINICAL  DECISION MAKING: Stable/uncomplicated  EVALUATION COMPLEXITY: Low   GOALS: Goals reviewed with patient? No  SHORT TERM GOALS: Target date: 06/02/23  Patient will demonstrate evidence of independence with individualized HEP and will report compliance for at least 3 days per week for optimized progression towards remaining therapy goals. Baseline: following post op exercises Goal status: INITIAL  2.  Patient will report a decrease in pain level during community ambulation by at least 2 points for improved quality of life. Baseline: 4/10 Goal status: INITIAL     LONG TERM GOALS: Target date: 06/23/23  Pt will demonstrate a an increase of at least 9 points on the LEFS for improved performance of community ambulation and ADL. Baseline: TBA next session Goal status: INITIAL  2.  Pt will improve 2 MWT to at least 578 feet in order to demonstrate age norms for functional ambulatory capacity in community setting.  Baseline: TBA as pt tolerates post op soreness Goal status: INITIAL  3.  Pt will demonstrate WFL ROM (flexion and extension) in left knee, for increased mobility and maximal efficiency of gait cycle during ambulation. Baseline: see objective Goal status: INITIAL  4.  Pt will demonstrate at least 4-/5 MMT for left lower extremity for increased strength during ADL and community ambulation. Baseline: TBA when pain reduces Goal status: INITIAL     PLAN:  PT FREQUENCY: 2x/week  PT DURATION: 6 weeks  PLANNED INTERVENTIONS: 97110-Therapeutic exercises, 97530- Therapeutic activity, [id number]- Neuromuscular re-education, 97535- Self Care, and [id number]- Manual therapy  PLAN FOR NEXT SESSION: Knee mobilty and progress LLE strengthening.  Trial with knee hang in prone if able to tolerate position.  Knee drive for flexion.   Becky Sax, LPTA/CLT; CBIS [phone]  12:28 PM, 05/15/23

## 2023-05-16 ENCOUNTER — Encounter: Payer: Self-pay | Admitting: Orthopaedic Surgery

## 2023-05-16 ENCOUNTER — Other Ambulatory Visit: Payer: Self-pay | Admitting: Orthopaedic Surgery

## 2023-05-16 MED ORDER — OXYCODONE HCL 5 MG PO TABS: 5.0000 mg | ORAL_TABLET | Freq: Four times a day (QID) | ORAL | 0 refills | Status: DC | PRN

## 2023-05-17 ENCOUNTER — Ambulatory Visit (HOSPITAL_COMMUNITY): Attending: Orthopaedic Surgery | Admitting: Physical Therapy

## 2023-05-17 ENCOUNTER — Other Ambulatory Visit: Payer: Self-pay | Admitting: Orthopaedic Surgery

## 2023-05-17 DIAGNOSIS — M25661 Stiffness of right knee, not elsewhere classified: Secondary | ICD-10-CM | POA: Diagnosis not present

## 2023-05-17 DIAGNOSIS — R262 Difficulty in walking, not elsewhere classified: Secondary | ICD-10-CM | POA: Diagnosis not present

## 2023-05-17 DIAGNOSIS — G8918 Other acute postprocedural pain: Secondary | ICD-10-CM | POA: Insufficient documentation

## 2023-05-17 DIAGNOSIS — M25562 Pain in left knee: Secondary | ICD-10-CM | POA: Diagnosis not present

## 2023-05-17 DIAGNOSIS — Z96652 Presence of left artificial knee joint: Secondary | ICD-10-CM | POA: Insufficient documentation

## 2023-05-17 DIAGNOSIS — M25561 Pain in right knee: Secondary | ICD-10-CM | POA: Diagnosis not present

## 2023-05-17 DIAGNOSIS — M1711 Unilateral primary osteoarthritis, right knee: Secondary | ICD-10-CM | POA: Insufficient documentation

## 2023-05-17 DIAGNOSIS — G8929 Other chronic pain: Secondary | ICD-10-CM | POA: Diagnosis not present

## 2023-05-17 NOTE — Therapy (Signed)
 OUTPATIENT PHYSICAL THERAPY LOWER EXTREMITY TREATMENT   Patient Name: Carrie [name] MRN: [id number] DOB:12-Mar-1970, 53 y.o., female Today's Date: 05/17/2023  END OF SESSION:  PT End of Session - 05/17/23 1356     Visit Number 3    Number of Visits 12    Date for PT Re-Evaluation 06/23/23    Authorization Type [employer], no auth required    PT Start Time 1355    PT Stop Time 1433    PT Time Calculation (min) 38 min    Equipment Utilized During Treatment Gait belt    Activity Tolerance Patient tolerated treatment well;Patient limited by pain    Behavior During Therapy WFL for tasks assessed/performed              Past Medical History:  Diagnosis Date   Arthritis    Arthrofibrosis of right total knee arthroplasty, subsequent encounter 05/20/2021   Diabetes mellitus without complication (HCC)    type 2   Essential hypertension    GERD (gastroesophageal reflux disease)    Headache    migraine   Hypothyroidism    Mixed hyperlipidemia    PONV (postoperative nausea and vomiting)    Really Sick!!! Had to stay in the hospital   Past Surgical History:  Procedure Laterality Date   ABDOMINAL HYSTERECTOMY     BIOPSY  04/25/2022   Procedure: BIOPSY;  Surgeon: Corbin Ade, MD;  Location: [facility];  Service: Endoscopy;;   COLONOSCOPY WITH PROPOFOL N/A 04/25/2022   Procedure: COLONOSCOPY WITH PROPOFOL;  Surgeon: Corbin Ade, MD;  Location: [facility];  Service: Endoscopy;  Laterality: N/A;  8:45 am, asa 2   ESOPHAGOGASTRODUODENOSCOPY (EGD) WITH PROPOFOL N/A 04/25/2022   Procedure: ESOPHAGOGASTRODUODENOSCOPY (EGD) WITH PROPOFOL;  Surgeon: Corbin Ade, MD;  [facility]: [facility];  Service: Endoscopy;  Laterality: N/A;   HARDWARE REMOVAL Right 04/02/2021   Procedure: Right Knee HARDWARE REMOVAL;  Surgeon: Kathryne Hitch, MD;  Location: [facility];  Service: Orthopedics;  Laterality: Right;   KNEE ARTHROSCOPY  02/15/2007   KNEE CLOSED REDUCTION Right 05/20/2021    Procedure: CLOSED MANIPULATION RIGHT KNEE;  Surgeon: Kathryne Hitch, MD;  Location: [facility];  Service: Orthopedics;  Laterality: Right;   MALONEY DILATION N/A 04/25/2022   Procedure: Elease Hashimoto DILATION;  Surgeon: Corbin Ade, MD;  Location: [facility];  Service: Endoscopy;  Laterality: N/A;   MASS EXCISION  02/11/2011   Procedure: EXCISION MASS;  Surgeon: Kerrin Champagne, MD;  Location: [facility];  Service: Orthopedics;  Laterality: Right;  resection of neuroma right infrapatellar medial knee   PATELLA ARTHROPLASTY     rt knee-   POLYPECTOMY  04/25/2022   Procedure: POLYPECTOMY;  Surgeon: Corbin Ade, MD;  Location: [facility];  Service: Endoscopy;;   REDUCTION MAMMAPLASTY Bilateral 2017   TOTAL KNEE ARTHROPLASTY  02/14/2009   partial-[place] [facility]    TOTAL KNEE ARTHROPLASTY Right 04/02/2021   Procedure: Right TOTAL KNEE ARTHROPLASTY;  Surgeon: Kathryne Hitch, MD;  Location: [facility];  Service: Orthopedics;  Laterality: Right;   TOTAL KNEE ARTHROPLASTY Left 05/05/2023   Procedure: LEFT TOTAL KNEE ARTHROPLASTY;  Surgeon: Kathryne Hitch, MD;  Location: [facility];  Service: Orthopedics;  Laterality: Left;   TUBAL LIGATION     Patient Active Problem List   Diagnosis Date Noted   Status post total left knee replacement 05/05/2023   Unilateral primary osteoarthritis, left knee 05/04/2023   Encounter for screening colonoscopy  04/04/2022   Esophageal dysphagia 04/04/2022   Constipation 04/04/2022   Arthrofibrosis of right total knee arthroplasty, subsequent encounter 05/20/2021   Pain in right knee 04/19/2021   Stiffness of right knee, not elsewhere classified 04/19/2021   Difficulty in walking, not elsewhere classified 04/19/2021   Status post total knee replacement, right 04/02/2021   Allergic rhinitis due to pollen 06/22/2020   Eosinophilic esophagitis 06/22/2020   Rash and other nonspecific skin eruption 06/22/2020   AC  joint arthropathy 01/04/2017   Acute pain of right shoulder 11/15/2016   Other derangements of patella, unspecified knee 09/02/2015   Recurrent dislocation of right patella 09/02/2015   Breast hypertrophy in female 05/13/2015   Neuroma of lower extremity 02/11/2011    Class: Chronic    PCP: Avis Epley, PA-C   REFERRING PROVIDER: Kathryne Hitch, MD Next apt: 05/18/23  REFERRING DIAG: Q65.784 (ICD-10-CM) - Status post total left knee replacement M17.12 (ICD-10-CM) - Unilateral primary osteoarthritis, left knee  THERAPY DIAG:  Acute postoperative pain of left knee  Status post total left knee replacement  Rationale for Evaluation and Treatment: Rehabilitation  ONSET DATE: 05/05/2023  SUBJECTIVE:   SUBJECTIVE STATEMENT: 05/17/23:  Stiffness continues to be biggest issue and some pain medially.  Pt states she returns to MD tomorrow and hopes to get the staples out.   Eval:  Pt reports extended stay in hospital following sx due to BP changes and having a temperature. Pt reports being independent with all household and community ambulation with no need for AD but was in significant amount of pain. Pt would like to be able to decrease the pain level and return to being able to "get up and go." Pt reports taking compression sleeve off this morning due to pain.  PERTINENT HISTORY: Surgery was a week ago today, 05/05/23. Pt has hx of R TKA 2 years ago.  PAIN:  Are you having pain? Yes: NPRS scale: 3/10 Pain location: left knee all around Pain description: dull  Aggravating factors: walking, standing Relieving factors: sitting, iceman  PRECAUTIONS: Knee  RED FLAGS: None   WEIGHT BEARING RESTRICTIONS: No  FALLS:  Has patient fallen in last 6 months? No  LIVING ENVIRONMENT: Lives with: lives with their spouse and lives with their daughter Lives in: House/apartment Stairs: Yes: External: 1 steps; none Has following equipment at home: Single point cane and  Crutches  OCCUPATION: tax office  PLOF: Independent with household mobility without device, Independent with community mobility without device, and pain with stairs  PATIENT GOALS: get rid of pain, wants to get her life back, walk dogs, wants to get up and go  NEXT MD VISIT: 05/18/23  OBJECTIVE:  Note: Objective measures were completed at Evaluation unless otherwise noted.  DIAGNOSTIC FINDINGS:  Radiographs 3/21: CLINICAL DATA:  Status post total left knee arthroplasty.   EXAM: PORTABLE LEFT KNEE - 1-2 VIEW   COMPARISON:  Left knee radiographs 01/09/2023   FINDINGS: Interval total left knee arthroplasty. No perihardware lucency is seen to indicate hardware failure or loosening. Expected postoperative changes including intra-articular and subcutaneous air. Smalljoint effusion. Anterior surgical skin staples. No acute fracture or dislocation.   IMPRESSION: Interval total left knee arthroplasty without evidence of hardware failure.  PATIENT SURVEYS:  LEFS 30/80 = 37.5%  COGNITION: Overall cognitive status: Within functional limits for tasks assessed     SENSATION: [facility] Not tested  EDEMA:  Pt demonstrates increased swelling in LLE with pt reporting, she took compression sleeve off this morning due  to pain. Pt denies throbbing pain and is educated on the risk of DVT and importance of inflammation control.  PALPATION: Pain/tenderness/soreness reported during palpation of L knee and mid calf region.   LOWER EXTREMITY ROM:  Active ROM Right eval Left eval  Hip flexion    Hip extension    Hip abduction    Hip adduction    Hip internal rotation    Hip external rotation    Knee flexion 109 60  Knee extension 4 15  Ankle dorsiflexion    Ankle plantarflexion    Ankle inversion    Ankle eversion     (Blank rows = not tested)  LOWER EXTREMITY MMT: not formally assessed upon eval due to pain  MMT Right eval Left eval  Hip flexion WFL   Hip extension [facility]   Hip  abduction    Hip adduction    Hip internal rotation    Hip external rotation    Knee flexion WFL   Knee extension WFL   Ankle dorsiflexion WFL   Ankle plantarflexion WFL   Ankle inversion    Ankle eversion     (Blank rows = not tested)   FUNCTIONAL TESTS:  5 times sit to stand: TBA 2 minute walk test: 226 ft with RW with slow cadence, decreased stride length and step length bilaterally, antalgic  30 second sit to stand 7 times in split stance with UE assistance from chair GAIT: Distance walked: 100 Assistive device utilized: Environmental consultant - 2 wheeled Level of assistance: Modified independence Comments: decreased speed, decreased stride length and step length bilaterally, antalgic                                                                                                                                TREATMENT DATE:  05/17/23 Bike seat 6, 5 minutes rocking Standing:  Lt knee flexion stretch 12" step 10X10"  LT knee extension/hamstring stretch on 12" step 5X20"  Lt knee flexion 10X Gait with SPC 1 RT Supine:  soft tissue massage, decongestive massage to reduce edema  Quad sets 10X5", extension -12  SAQ 10X3" AROM -12 to 68  05/15/23: Reviewed goals 226 with RW  30 second sit to stand 7 times in split stance with UE assistance from chair  Supine: Quad sets 10x 5" SAQ: 10x 5" Heel slides 10x 5" AROM: 11-61 degrees  Manual: Decongestive massage with LE elevated Bike rocking seat 6 5'  05/12/2023  Vitals: BP: 145/87 HR: 102  Therapeutic Exercise: Quad sets, 1 set of 10 reps, 3 second holds Ankle pumps, 2 sets of 10 reps Hip abductions, 1 set of 10 reps, pt cued for decreased ROM for pain management Heel Slides, green strap, 2 set of 10 reps, pt cued for max tolerated flexion     PATIENT EDUCATION:  Education details: Pt was educated on findings of PT evaluation, prognosis, frequency of therapy visits and rationale, attendance policy, and HEP  if given.    Person educated: Patient Education method: Explanation Education comprehension: verbalized understanding and needs further education  HOME EXERCISE PROGRAM: Access Code: [email or web address] URL: [email or web address] Date: 05/12/2023 Prepared by: Luz Lex  Exercises - Supine Ankle Pumps - 3 x daily - 1 sets - 15-20 reps - Supine Hip Abduction - 3 x daily - 1 sets - 15-20 reps - Supine Quad Set - 3 x daily - 1 sets - 15-20 reps - 3 sec hold - Supine Heel Slide with Strap - 3 x daily - 3 sets - 15-20 reps - 3 sec hold   ASSESSMENT:  CLINICAL IMPRESSION: 05/17/23:  Pt still unable to make full revolutions on bike but able to obtain good stretch.  Added standing knee flexion and hamstring stretch. Gait training using SPC with overall good cadence, minimal antalgia and no c/o pain.  Pt did report she is not fully comfortable using SPC yet as feels her knee is still too weak.  Manual completed with some sensitivity medially and noted edema still present as well.  Pt to return to MD tomorrow and will likely have improved ROM and comfort following bandage/staple removal. AROM slightly improved for flexion but no change for extension today at 12-68 degrees (was 11-61 last visit). Pt will continue to benefit from skilled therapy.  Eval:Patient is a 53 y.o. female who was seen today for physical therapy evaluation and treatment for status post Left TKA, 05/05/2023. Pt demonstrates limited left knee ROM and strength, limited by pain. Pt demonstrates difficulty with ambulation and requires RW. Pt demonstrates decreased gait speed and efficiency due to pain and RW height, RW was adjusted and pt was educated on DME. Patient would continue to benefit from skilled physical therapy to increase pain free L knee ROM, increased LLE strength for improved quality of life, community ambulation and continued progress towards therapy goals.   OBJECTIVE IMPAIRMENTS: Abnormal gait, decreased activity tolerance,  decreased balance, decreased coordination, decreased knowledge of use of DME, decreased mobility, difficulty walking, decreased ROM, decreased strength, increased edema, and pain.   ACTIVITY LIMITATIONS: carrying, bending, standing, squatting, sleeping, stairs, and bed mobility  PARTICIPATION LIMITATIONS: meal prep, cleaning, laundry, shopping, community activity, occupation, and yard work  PERSONAL FACTORS: Age, Past/current experiences, and 1 comorbidity: had to stay a bit longer in the hospital following sx due to temperature and BP changes  are also affecting patient's functional outcome.   REHAB POTENTIAL: Good  CLINICAL DECISION MAKING: Stable/uncomplicated  EVALUATION COMPLEXITY: Low   GOALS: Goals reviewed with patient? No  SHORT TERM GOALS: Target date: 06/02/23  Patient will demonstrate evidence of independence with individualized HEP and will report compliance for at least 3 days per week for optimized progression towards remaining therapy goals. Baseline: following post op exercises Goal status: INITIAL  2.  Patient will report a decrease in pain level during community ambulation by at least 2 points for improved quality of life. Baseline: 4/10 Goal status: INITIAL     LONG TERM GOALS: Target date: 06/23/23  Pt will demonstrate a an increase of at least 9 points on the LEFS for improved performance of community ambulation and ADL. Baseline: TBA next session Goal status: INITIAL  2.  Pt will improve 2 MWT to at least 578 feet in order to demonstrate age norms for functional ambulatory capacity in community setting.  Baseline: TBA as pt tolerates post op soreness Goal status: INITIAL  3.  Pt will demonstrate WFL ROM (flexion and extension) in left knee, for  increased mobility and maximal efficiency of gait cycle during ambulation. Baseline: see objective Goal status: INITIAL  4.  Pt will demonstrate at least 4-/5 MMT for left lower extremity for increased strength  during ADL and community ambulation. Baseline: TBA when pain reduces Goal status: INITIAL     PLAN:  PT FREQUENCY: 2x/week  PT DURATION: 6 weeks  PLANNED INTERVENTIONS: 97110-Therapeutic exercises, 97530- Therapeutic activity, [id number]- Neuromuscular re-education, 97535- Self Care, and [id number]- Manual therapy  PLAN FOR NEXT SESSION: Knee mobilty and progress LLE strengthening.  Trial with knee hang in prone if able to tolerate position.    Lurena Nida, PTA/CLT [facility] Ph: [phone]  3:16 PM, 05/17/23

## 2023-05-18 ENCOUNTER — Ambulatory Visit (INDEPENDENT_AMBULATORY_CARE_PROVIDER_SITE_OTHER): Payer: BC Managed Care – PPO | Admitting: Orthopaedic Surgery

## 2023-05-18 ENCOUNTER — Encounter: Payer: Self-pay | Admitting: Orthopaedic Surgery

## 2023-05-18 DIAGNOSIS — Z6830 Body mass index (BMI) 30.0-30.9, adult: Secondary | ICD-10-CM | POA: Diagnosis not present

## 2023-05-18 DIAGNOSIS — Z96652 Presence of left artificial knee joint: Secondary | ICD-10-CM

## 2023-05-18 DIAGNOSIS — E6609 Other obesity due to excess calories: Secondary | ICD-10-CM | POA: Diagnosis not present

## 2023-05-18 DIAGNOSIS — R110 Nausea: Secondary | ICD-10-CM | POA: Diagnosis not present

## 2023-05-18 NOTE — Progress Notes (Signed)
 The patient is here today for first postoperative visit status post a left total knee replacement.  She is already in outpatient physical therapy.  She has been compliant with a baby aspirin twice daily.  Examination right knee shows almost full extension but I could only flex her to maybe 65 degrees.  Her staples were removed and Steri-Strips applied.  Calf is soft.  She is wearing compressive hose as well.  She will continue wearing compressive hose to the foot and ankle swelling and popping her feet multiple times a day.  She will continue outpatient physical therapy.  Hopefully she will get her knee flexing more when we see her back in 4 weeks.  Of note she did have a knee replaced on her other side that required a manipulation several years ago.  Hopefully that will be the case for this knee.

## 2023-05-23 ENCOUNTER — Ambulatory Visit (HOSPITAL_COMMUNITY)

## 2023-05-23 DIAGNOSIS — M1711 Unilateral primary osteoarthritis, right knee: Secondary | ICD-10-CM | POA: Diagnosis not present

## 2023-05-23 DIAGNOSIS — M25562 Pain in left knee: Secondary | ICD-10-CM

## 2023-05-23 DIAGNOSIS — G8918 Other acute postprocedural pain: Secondary | ICD-10-CM | POA: Diagnosis not present

## 2023-05-23 DIAGNOSIS — Z96652 Presence of left artificial knee joint: Secondary | ICD-10-CM

## 2023-05-23 DIAGNOSIS — M25661 Stiffness of right knee, not elsewhere classified: Secondary | ICD-10-CM | POA: Diagnosis not present

## 2023-05-23 DIAGNOSIS — M25561 Pain in right knee: Secondary | ICD-10-CM | POA: Diagnosis not present

## 2023-05-23 DIAGNOSIS — G8929 Other chronic pain: Secondary | ICD-10-CM | POA: Diagnosis not present

## 2023-05-23 DIAGNOSIS — R262 Difficulty in walking, not elsewhere classified: Secondary | ICD-10-CM | POA: Diagnosis not present

## 2023-05-23 NOTE — Therapy (Signed)
 OUTPATIENT PHYSICAL THERAPY LOWER EXTREMITY TREATMENT   Patient Name: Carrie [name] MRN: [id number] DOB:Jul 11, 1970, 53 y.o., female Today's Date: 05/23/2023  END OF SESSION:  PT End of Session - 05/23/23 1432     Visit Number 4    Number of Visits 12    Date for PT Re-Evaluation 06/23/23    Authorization Type [employer], no auth required    PT Start Time 1350    PT Stop Time 1430    PT Time Calculation (min) 40 min    Equipment Utilized During Treatment Gait belt    Activity Tolerance Patient tolerated treatment well;Patient limited by pain    Behavior During Therapy WFL for tasks assessed/performed               Past Medical History:  Diagnosis Date   Arthritis    Arthrofibrosis of right total knee arthroplasty, subsequent encounter 05/20/2021   Diabetes mellitus without complication (HCC)    type 2   Essential hypertension    GERD (gastroesophageal reflux disease)    Headache    migraine   Hypothyroidism    Mixed hyperlipidemia    PONV (postoperative nausea and vomiting)    Really Sick!!! Had to stay in the hospital   Past Surgical History:  Procedure Laterality Date   ABDOMINAL HYSTERECTOMY     BIOPSY  04/25/2022   Procedure: BIOPSY;  Surgeon: Corbin Ade, MD;  Location: [facility];  Service: Endoscopy;;   COLONOSCOPY WITH PROPOFOL N/A 04/25/2022   Procedure: COLONOSCOPY WITH PROPOFOL;  Surgeon: Corbin Ade, MD;  Location: [facility];  Service: Endoscopy;  Laterality: N/A;  8:45 am, asa 2   ESOPHAGOGASTRODUODENOSCOPY (EGD) WITH PROPOFOL N/A 04/25/2022   Procedure: ESOPHAGOGASTRODUODENOSCOPY (EGD) WITH PROPOFOL;  Surgeon: Corbin Ade, MD;  Location: [facility];  Service: Endoscopy;  Laterality: N/A;   HARDWARE REMOVAL Right 04/02/2021   Procedure: Right Knee HARDWARE REMOVAL;  Surgeon: Kathryne Hitch, MD;  Location: [facility];  Service: Orthopedics;  Laterality: Right;   KNEE ARTHROSCOPY  02/15/2007   KNEE CLOSED REDUCTION Right  05/20/2021   Procedure: CLOSED MANIPULATION RIGHT KNEE;  Surgeon: Kathryne Hitch, MD;  Location: [facility];  Service: Orthopedics;  Laterality: Right;   MALONEY DILATION N/A 04/25/2022   Procedure: Elease Hashimoto DILATION;  Surgeon: Corbin Ade, MD;  Location: [facility];  Service: Endoscopy;  Laterality: N/A;   MASS EXCISION  02/11/2011   Procedure: EXCISION MASS;  Surgeon: Kerrin Champagne, MD;  Location: [facility];  Service: Orthopedics;  Laterality: Right;  resection of neuroma right infrapatellar medial knee   PATELLA ARTHROPLASTY     rt knee-   POLYPECTOMY  04/25/2022   Procedure: POLYPECTOMY;  Surgeon: Corbin Ade, MD;  Location: [facility];  Service: Endoscopy;;   REDUCTION MAMMAPLASTY Bilateral 2017   TOTAL KNEE ARTHROPLASTY  02/14/2009   partial-Sedillo reg    TOTAL KNEE ARTHROPLASTY Right 04/02/2021   Procedure: Right TOTAL KNEE ARTHROPLASTY;  Surgeon: Kathryne Hitch, MD;  Location: [facility];  Service: Orthopedics;  Laterality: Right;   TOTAL KNEE ARTHROPLASTY Left 05/05/2023   Procedure: LEFT TOTAL KNEE ARTHROPLASTY;  Surgeon: Kathryne Hitch, MD;  Location: [facility];  Service: Orthopedics;  Laterality: Left;   TUBAL LIGATION     Patient Active Problem List   Diagnosis Date Noted   Status post total left knee replacement 05/05/2023   Encounter for screening colonoscopy 04/04/2022   Esophageal dysphagia 04/04/2022  Constipation 04/04/2022   Arthrofibrosis of right total knee arthroplasty, subsequent encounter 05/20/2021   Pain in right knee 04/19/2021   Stiffness of right knee, not elsewhere classified 04/19/2021   Difficulty in walking, not elsewhere classified 04/19/2021   Status post total knee replacement, right 04/02/2021   Allergic rhinitis due to pollen 06/22/2020   Eosinophilic esophagitis 06/22/2020   Rash and other nonspecific skin eruption 06/22/2020   AC joint arthropathy 01/04/2017   Acute pain of  right shoulder 11/15/2016   Other derangements of patella, unspecified knee 09/02/2015   Recurrent dislocation of right patella 09/02/2015   Breast hypertrophy in female 05/13/2015   Neuroma of lower extremity 02/11/2011    Class: Chronic    PCP: Avis Epley, PA-C   REFERRING PROVIDER: Kathryne Hitch, MD Next apt: 05/18/23  REFERRING DIAG: Q03.474 (ICD-10-CM) - Status post total left knee replacement M17.12 (ICD-10-CM) - Unilateral primary osteoarthritis, left knee  THERAPY DIAG:  Acute postoperative pain of left knee  Status post total left knee replacement  Rationale for Evaluation and Treatment: Rehabilitation  ONSET DATE: 05/05/2023  SUBJECTIVE:   SUBJECTIVE STATEMENT: 05/23/23 reports general stiffness; still using her walker  Eval:  Pt reports extended stay in hospital following sx due to BP changes and having a temperature. Pt reports being independent with all household and community ambulation with no need for AD but was in significant amount of pain. Pt would like to be able to decrease the pain level and return to being able to "get up and go." Pt reports taking compression sleeve off this morning due to pain.  PERTINENT HISTORY: Surgery was a week ago today, 05/05/23. Pt has hx of R TKA 2 years ago.  PAIN:  Are you having pain? Yes: NPRS scale: 3/10 Pain location: left knee all around Pain description: dull  Aggravating factors: walking, standing Relieving factors: sitting, iceman  PRECAUTIONS: Knee  RED FLAGS: None   WEIGHT BEARING RESTRICTIONS: No  FALLS:  Has patient fallen in last 6 months? No  LIVING ENVIRONMENT: Lives with: lives with their spouse and lives with their daughter Lives in: House/apartment Stairs: Yes: External: 1 steps; none Has following equipment at home: Single point cane and Crutches  OCCUPATION: tax office  PLOF: Independent with household mobility without device, Independent with community mobility without  device, and pain with stairs  PATIENT GOALS: get rid of pain, wants to get her life back, walk dogs, wants to get up and go  NEXT MD VISIT: 05/18/23  OBJECTIVE:  Note: Objective measures were completed at Evaluation unless otherwise noted.  DIAGNOSTIC FINDINGS:  Radiographs 3/21: CLINICAL DATA:  Status post total left knee arthroplasty.   EXAM: PORTABLE LEFT KNEE - 1-2 VIEW   COMPARISON:  Left knee radiographs 01/09/2023   FINDINGS: Interval total left knee arthroplasty. No perihardware lucency is seen to indicate hardware failure or loosening. Expected postoperative changes including intra-articular and subcutaneous air. Smalljoint effusion. Anterior surgical skin staples. No acute fracture or dislocation.   IMPRESSION: Interval total left knee arthroplasty without evidence of hardware failure.  PATIENT SURVEYS:  LEFS 30/80 = 37.5%  COGNITION: Overall cognitive status: Within functional limits for tasks assessed     SENSATION: [facility] Not tested  EDEMA:  Pt demonstrates increased swelling in LLE with pt reporting, she took compression sleeve off this morning due to pain. Pt denies throbbing pain and is educated on the risk of DVT and importance of inflammation control.  PALPATION: Pain/tenderness/soreness reported during palpation of L knee  and mid calf region.   LOWER EXTREMITY ROM:  Active ROM Right eval Left eval Left 05/23/23  Hip flexion     Hip extension     Hip abduction     Hip adduction     Hip internal rotation     Hip external rotation     Knee flexion 109 60 77  Knee extension 4 15 -10 (lacking)  Ankle dorsiflexion     Ankle plantarflexion     Ankle inversion     Ankle eversion      (Blank rows = not tested)  LOWER EXTREMITY MMT: not formally assessed upon eval due to pain  MMT Right eval Left eval  Hip flexion WFL   Hip extension [facility]   Hip abduction    Hip adduction    Hip internal rotation    Hip external rotation    Knee flexion WFL    Knee extension WFL   Ankle dorsiflexion WFL   Ankle plantarflexion WFL   Ankle inversion    Ankle eversion     (Blank rows = not tested)   FUNCTIONAL TESTS:  5 times sit to stand: TBA 2 minute walk test: 226 ft with RW with slow cadence, decreased stride length and step length bilaterally, antalgic  30 second sit to stand 7 times in split stance with UE assistance from chair GAIT: Distance walked: 100 Assistive device utilized: Walker - 2 wheeled Level of assistance: Modified independence Comments: decreased speed, decreased stride length and step length bilaterally, antalgic                                                                                                                                TREATMENT DATE:  05/23/23 STM to left knee to decrease pain and improve soft tissue extensibility followed by manual ROM x 15 AAROM left knee -10 to 77 Bike seat 6 x 6' rocking Standing: 8" box left knee drives for flexion x 3' Heel raises 2 x 10 Calf/gastroc stretch 10" x 10 Mini squats x 10  05/17/23 Bike seat 6, 5 minutes rocking Standing:  Lt knee flexion stretch 12" step 10X10"  LT knee extension/hamstring stretch on 12" step 5X20"  Lt knee flexion 10X Gait with SPC 1 RT Supine:  soft tissue massage, decongestive massage to reduce edema  Quad sets 10X5", extension -12  SAQ 10X3" AROM -12 to 68  05/15/23: Reviewed goals 226 with RW  30 second sit to stand 7 times in split stance with UE assistance from chair  Supine: Quad sets 10x 5" SAQ: 10x 5" Heel slides 10x 5" AROM: 11-61 degrees  Manual: Decongestive massage with LE elevated Bike rocking seat 6 5'  05/12/2023  Vitals: BP: 145/87 HR: 102  Therapeutic Exercise: Quad sets, 1 set of 10 reps, 3 second holds Ankle pumps, 2 sets of 10 reps Hip abductions, 1 set of 10 reps, pt cued for decreased ROM for pain management  Heel Slides, green strap, 2 set of 10 reps, pt cued for max tolerated flexion      PATIENT EDUCATION:  Education details: Pt was educated on findings of PT evaluation, prognosis, frequency of therapy visits and rationale, attendance policy, and HEP if given.   Person educated: Patient Education method: Explanation Education comprehension: verbalized understanding and needs further education  HOME EXERCISE PROGRAM: Access Code: [email or web address] URL: [email or web address] Date: 05/12/2023 Prepared by: Luz Lex  Exercises - Supine Ankle Pumps - 3 x daily - 1 sets - 15-20 reps - Supine Hip Abduction - 3 x daily - 1 sets - 15-20 reps - Supine Quad Set - 3 x daily - 1 sets - 15-20 reps - 3 sec hold - Supine Heel Slide with Strap - 3 x daily - 3 sets - 15-20 reps - 3 sec hold   ASSESSMENT:  CLINICAL IMPRESSION: Today's session with focus on left knee mobility and gentle strengthening.  Of note decreased reliance on RW in PT gym.  Improving slowly with knee mobility.  Well healing incision noted with steristrips in place.   Pt will continue to benefit from skilled therapy to address deficits and promote optimal function.  Eval:Patient is a 53 y.o. female who was seen today for physical therapy evaluation and treatment for status post Left TKA, 05/05/2023. Pt demonstrates limited left knee ROM and strength, limited by pain. Pt demonstrates difficulty with ambulation and requires RW. Pt demonstrates decreased gait speed and efficiency due to pain and RW height, RW was adjusted and pt was educated on DME. Patient would continue to benefit from skilled physical therapy to increase pain free L knee ROM, increased LLE strength for improved quality of life, community ambulation and continued progress towards therapy goals.   OBJECTIVE IMPAIRMENTS: Abnormal gait, decreased activity tolerance, decreased balance, decreased coordination, decreased knowledge of use of DME, decreased mobility, difficulty walking, decreased ROM, decreased strength, increased edema, and pain.    ACTIVITY LIMITATIONS: carrying, bending, standing, squatting, sleeping, stairs, and bed mobility  PARTICIPATION LIMITATIONS: meal prep, cleaning, laundry, shopping, community activity, occupation, and yard work  PERSONAL FACTORS: Age, Past/current experiences, and 1 comorbidity: had to stay a bit longer in the hospital following sx due to temperature and BP changes  are also affecting patient's functional outcome.   REHAB POTENTIAL: Good  CLINICAL DECISION MAKING: Stable/uncomplicated  EVALUATION COMPLEXITY: Low   GOALS: Goals reviewed with patient? No  SHORT TERM GOALS: Target date: 06/02/23  Patient will demonstrate evidence of independence with individualized HEP and will report compliance for at least 3 days per week for optimized progression towards remaining therapy goals. Baseline: following post op exercises Goal status: INITIAL  2.  Patient will report a decrease in pain level during community ambulation by at least 2 points for improved quality of life. Baseline: 4/10 Goal status: INITIAL     LONG TERM GOALS: Target date: 06/23/23  Pt will demonstrate a an increase of at least 9 points on the LEFS for improved performance of community ambulation and ADL. Baseline: TBA next session Goal status: INITIAL  2.  Pt will improve 2 MWT to at least 578 feet in order to demonstrate age norms for functional ambulatory capacity in community setting.  Baseline: TBA as pt tolerates post op soreness Goal status: INITIAL  3.  Pt will demonstrate WFL ROM (flexion and extension) in left knee, for increased mobility and maximal efficiency of gait cycle during ambulation. Baseline: see objective Goal status: INITIAL  4.  Pt will  demonstrate at least 4-/5 MMT for left lower extremity for increased strength during ADL and community ambulation. Baseline: TBA when pain reduces Goal status: INITIAL     PLAN:  PT FREQUENCY: 2x/week  PT DURATION: 6 weeks  PLANNED INTERVENTIONS:  [id number]-Therapeutic exercises, 97530- Therapeutic activity, [id number]- Neuromuscular re-education, 97535- Self Care, and [id number]- Manual therapy  PLAN FOR NEXT SESSION: Knee mobilty and progress LLE strengthening.  Trial with knee hang in prone if able to tolerate position.    2:32 PM, 05/23/23 Maniyah Moller Small Taliah Porche MPT [facility] [place] [phone]

## 2023-05-26 ENCOUNTER — Encounter (HOSPITAL_COMMUNITY): Payer: Self-pay

## 2023-05-26 ENCOUNTER — Ambulatory Visit (HOSPITAL_COMMUNITY)

## 2023-05-26 DIAGNOSIS — M1711 Unilateral primary osteoarthritis, right knee: Secondary | ICD-10-CM | POA: Diagnosis not present

## 2023-05-26 DIAGNOSIS — M25562 Pain in left knee: Secondary | ICD-10-CM | POA: Diagnosis not present

## 2023-05-26 DIAGNOSIS — G8918 Other acute postprocedural pain: Secondary | ICD-10-CM | POA: Diagnosis not present

## 2023-05-26 DIAGNOSIS — G8929 Other chronic pain: Secondary | ICD-10-CM | POA: Diagnosis not present

## 2023-05-26 DIAGNOSIS — R262 Difficulty in walking, not elsewhere classified: Secondary | ICD-10-CM | POA: Diagnosis not present

## 2023-05-26 DIAGNOSIS — Z96652 Presence of left artificial knee joint: Secondary | ICD-10-CM

## 2023-05-26 DIAGNOSIS — M25561 Pain in right knee: Secondary | ICD-10-CM | POA: Diagnosis not present

## 2023-05-26 DIAGNOSIS — M25661 Stiffness of right knee, not elsewhere classified: Secondary | ICD-10-CM | POA: Diagnosis not present

## 2023-05-26 NOTE — Therapy (Signed)
 OUTPATIENT PHYSICAL THERAPY LOWER EXTREMITY TREATMENT   Patient Name: SUMMERLYN FICKEL MRN: [id number] DOB:03-22-1970, 53 y.o., female Today's Date: 05/26/2023  END OF SESSION:  PT End of Session - 05/26/23 0758     Visit Number 5    Number of Visits 12    Date for PT Re-Evaluation 06/23/23    Authorization Type [employer], no auth required    PT Start Time 0758    PT Stop Time 0842    PT Time Calculation (min) 44 min    Activity Tolerance Patient tolerated treatment well    Behavior During Therapy [facility] for tasks assessed/performed               Past Medical History:  Diagnosis Date   Arthritis    Arthrofibrosis of right total knee arthroplasty, subsequent encounter 05/20/2021   Diabetes mellitus without complication (HCC)    type 2   Essential hypertension    GERD (gastroesophageal reflux disease)    Headache    migraine   Hypothyroidism    Mixed hyperlipidemia    PONV (postoperative nausea and vomiting)    Really Sick!!! Had to stay in the hospital   Past Surgical History:  Procedure Laterality Date   ABDOMINAL HYSTERECTOMY     BIOPSY  04/25/2022   Procedure: BIOPSY;  Surgeon: Corbin Ade, MD;  [facility];  Service: Endoscopy;;   COLONOSCOPY WITH PROPOFOL N/A 04/25/2022   Procedure: COLONOSCOPY WITH PROPOFOL;  Surgeon: Corbin Ade, MD;  [facility];  Service: Endoscopy;  Laterality: N/A;  8:45 am, asa 2   ESOPHAGOGASTRODUODENOSCOPY (EGD) WITH PROPOFOL N/A 04/25/2022   Procedure: ESOPHAGOGASTRODUODENOSCOPY (EGD) WITH PROPOFOL;  Surgeon: Corbin Ade, MD;  [facility];  Service: Endoscopy;  Laterality: N/A;   HARDWARE REMOVAL Right 04/02/2021   Procedure: Right Knee HARDWARE REMOVAL;  Surgeon: Kathryne Hitch, MD;  Location: [facility];  Service: Orthopedics;  Laterality: Right;   KNEE ARTHROSCOPY  02/15/2007   KNEE CLOSED REDUCTION Right 05/20/2021   Procedure: CLOSED MANIPULATION RIGHT KNEE;  Surgeon: Kathryne Hitch, MD;  Location: [facility];  Service: Orthopedics;  Laterality: Right;   MALONEY DILATION N/A 04/25/2022   Procedure: Elease Hashimoto DILATION;  Surgeon: Corbin Ade, MD;  [facility];  Service: Endoscopy;  Laterality: N/A;   MASS EXCISION  02/11/2011   Procedure: EXCISION MASS;  Surgeon: Kerrin Champagne, MD;  Location:  SUR[facility]ERY [facility]ENTER;  Service: Orthopedics;  Laterality: Right;  resection of neuroma right infrapatellar medial knee   PATELLA ARTHROPLASTY     rt knee-   POLYPECTOMY  04/25/2022   Procedure: POLYPECTOMY;  Surgeon: Corbin Ade, MD;  [facility];  Service: Endoscopy;;   REDUCTION MAMMAPLASTY Bilateral 2017   TOTAL KNEE ARTHROPLASTY  02/14/2009   partial-[place] [facility]    TOTAL KNEE ARTHROPLASTY Right 04/02/2021   Procedure: Right TOTAL KNEE ARTHROPLASTY;  Surgeon: Kathryne Hitch, MD;  Location: [facility];  Service: Orthopedics;  Laterality: Right;   TOTAL KNEE ARTHROPLASTY Left 05/05/2023   Procedure: LEFT TOTAL KNEE ARTHROPLASTY;  Surgeon: Kathryne Hitch, MD;  Location: [facility];  Service: Orthopedics;  Laterality: Left;   TUBAL LIGATION     Patient Active Problem List   Diagnosis Date Noted   Status post total left knee replacement 05/05/2023   Encounter for screening colonoscopy 04/04/2022   Esophageal dysphagia 04/04/2022   Constipation 04/04/2022   Arthrofibrosis of right total knee arthroplasty, subsequent  encounter 05/20/2021   Pain in right knee 04/19/2021   Stiffness of right knee, not elsewhere classified 04/19/2021   Difficulty in walking, not elsewhere classified 04/19/2021   Status post total knee replacement, right 04/02/2021   Allergic rhinitis due to pollen 06/22/2020   Eosinophilic esophagitis 06/22/2020   Rash and other nonspecific skin eruption 06/22/2020   AC joint arthropathy 01/04/2017   Acute pain of right shoulder 11/15/2016   Other derangements of patella, unspecified knee  09/02/2015   Recurrent dislocation of right patella 09/02/2015   Breast hypertrophy in female 05/13/2015   Neuroma of lower extremity 02/11/2011    Class: Chronic    PCP: Avis Epley, PA-C   REFERRING PROVIDER: Kathryne Hitch, MD Next apt: 05/18/23  REFERRING DIAG: Z30.865 (ICD-10-CM) - Status post total left knee replacement M17.12 (ICD-10-CM) - Unilateral primary osteoarthritis, left knee  THERAPY DIAG:  Acute postoperative pain of left knee  Status post total left knee replacement  Rationale for Evaluation and Treatment: Rehabilitation  ONSET DATE: 05/05/2023  SUBJECTIVE:   SUBJECTIVE STATEMENT: 05/26/23:  Pain is low this morning, maybe 1-2/10 Lt knee achy stiff pain, feels the weather plays a part.  Arrived ambulating with no AD.  Eval:  Pt reports extended stay in hospital following sx due to BP changes and having a temperature. Pt reports being independent with all household and community ambulation with no need for AD but was in significant amount of pain. Pt would like to be able to decrease the pain level and return to being able to "get up and go." Pt reports taking compression sleeve off this morning due to pain.  PERTINENT HISTORY: Surgery was a week ago today, 05/05/23. Pt has hx of R TKA 2 years ago.  PAIN:  Are you having pain? Yes: NPRS scale: 3/10 Pain location: left knee all around Pain description: dull  Aggravating factors: walking, standing Relieving factors: sitting, iceman  PRECAUTIONS: Knee  RED FLAGS: None   WEIGHT BEARING RESTRICTIONS: No  FALLS:  Has patient fallen in last 6 months? No  LIVING ENVIRONMENT: Lives with: lives with their spouse and lives with their daughter Lives in: House/apartment Stairs: Yes: External: 1 steps; none Has following equipment at home: Single point cane and Crutches  OCCUPATION: tax office  PLOF: Independent with household mobility without device, Independent with community mobility without  device, and pain with stairs  PATIENT GOALS: get rid of pain, wants to get her life back, walk dogs, wants to get up and go  NEXT MD VISIT: 05/18/23  OBJECTIVE:  Note: Objective measures were completed at Evaluation unless otherwise noted.  DIAGNOSTIC FINDINGS:  Radiographs 3/21: CLINICAL DATA:  Status post total left knee arthroplasty.   EXAM: PORTABLE LEFT KNEE - 1-2 VIEW   COMPARISON:  Left knee radiographs 01/09/2023   FINDINGS: Interval total left knee arthroplasty. No perihardware lucency is seen to indicate hardware failure or loosening. Expected postoperative changes including intra-articular and subcutaneous air. Smalljoint effusion. Anterior surgical skin staples. No acute fracture or dislocation.   IMPRESSION: Interval total left knee arthroplasty without evidence of hardware failure.  PATIENT SURVEYS:  LEFS 30/80 = 37.5%  COGNITION: Overall cognitive status: Within functional limits for tasks assessed     SENSATION: [facility] Not tested  EDEMA:  Pt demonstrates increased swelling in LLE with pt reporting, she took compression sleeve off this morning due to pain. Pt denies throbbing pain and is educated on the risk of DVT and importance of inflammation control.  PALPATION:  Pain/tenderness/soreness reported during palpation of L knee and mid calf region.   LOWER EXTREMITY ROM:  Active ROM Right eval Left eval Left 05/23/23  Hip flexion     Hip extension     Hip abduction     Hip adduction     Hip internal rotation     Hip external rotation     Knee flexion 109 60 77  Knee extension 4 15 -10 (lacking)  Ankle dorsiflexion     Ankle plantarflexion     Ankle inversion     Ankle eversion      (Blank rows = not tested)  LOWER EXTREMITY MMT: not formally assessed upon eval due to pain  MMT Right eval Left eval  Hip flexion WFL   Hip extension [facility]   Hip abduction    Hip adduction    Hip internal rotation    Hip external rotation    Knee flexion WFL    Knee extension WFL   Ankle dorsiflexion WFL   Ankle plantarflexion WFL   Ankle inversion    Ankle eversion     (Blank rows = not tested)   FUNCTIONAL TESTS:  5 times sit to stand: TBA 2 minute walk test: 226 ft with RW with slow cadence, decreased stride length and step length bilaterally, antalgic  30 second sit to stand 7 times in split stance with UE assistance from chair GAIT: Distance walked: 100 Assistive device utilized: Environmental consultant - 2 wheeled Level of assistance: Modified independence Comments: decreased speed, decreased stride length and step length bilaterally, antalgic                                                                                                                                TREATMENT DATE:  05/26/23: Gait training with cueing for heel strike and toe push off x228ft no AD Bike seat 6 x 5' rocking Standing: Knee drives on 12in step 5x 10" holds for flexion Heel raises on incline slope 15x 5" Toe raises on decline slope 15x 5" TKE with RTB 10x5" Hamstring stretch on 12in step 3x 30" Slant board 3x 30" Prone:  Prone knee hang x 2 min  Manual:  Decongestive massage with LE elevated, STM to quadriceps in supine position with LE elevated.  05/23/23 STM to left knee to decrease pain and improve soft tissue extensibility followed by manual ROM x 15 AAROM left knee -10 to 77 Bike seat 6 x 6' rocking Standing: 8" box left knee drives for flexion x 3' Heel raises 2 x 10 Calf/gastroc stretch 10" x 10 Mini squats x 10  05/17/23 Bike seat 6, 5 minutes rocking Standing:  Lt knee flexion stretch 12" step 10X10"  LT knee extension/hamstring stretch on 12" step 5X20"  Lt knee flexion 10X Gait with SPC 1 RT Supine:  soft tissue massage, decongestive massage to reduce edema  Quad sets 10X5", extension -12  SAQ 10X3" AROM -12 to 68  05/15/23: Reviewed goals 226 with RW  30 second sit to stand 7 times in split stance with UE assistance from  chair  Supine: Quad sets 10x 5" SAQ: 10x 5" Heel slides 10x 5" AROM: 11-61 degrees  Manual: Decongestive massage with LE elevated Bike rocking seat 6 5'  05/12/2023  Vitals: BP: 145/87 HR: 102  Therapeutic Exercise: Quad sets, 1 set of 10 reps, 3 second holds Ankle pumps, 2 sets of 10 reps Hip abductions, 1 set of 10 reps, pt cued for decreased ROM for pain management Heel Slides, green strap, 2 set of 10 reps, pt cued for max tolerated flexion     PATIENT EDUCATION:  Education details: Pt was educated on findings of PT evaluation, prognosis, frequency of therapy visits and rationale, attendance policy, and HEP if given.   Person educated: Patient Education method: Explanation Education comprehension: verbalized understanding and needs further education  HOME EXERCISE PROGRAM: Access Code: [email or web address] URL: [email or web address] Date: 05/12/2023 Prepared by: Luz Lex  Exercises - Supine Ankle Pumps - 3 x daily - 1 sets - 15-20 reps - Supine Hip Abduction - 3 x daily - 1 sets - 15-20 reps - Supine Quad Set - 3 x daily - 1 sets - 15-20 reps - 3 sec hold - Supine Heel Slide with Strap - 3 x daily - 3 sets - 15-20 reps - 3 sec hold   - Prone Knee Extension Hang  - 2 x daily - 7 x weekly - 1 sets - 2 reps - 1'+ hold - Prone Quadriceps Stretch with Strap  - 2 x daily - 7 x weekly - 1 sets - 3 reps - 30" hold - Supine Knee Extension Mobilization with Weight  - 2 x daily - 7 x weekly - 1 sets - 1'+ hold  ASSESSMENT:  CLINICAL IMPRESSION: Pt arrived without AD, min cueing to improve heel strike and toe push off to normalize gait mechanics.  Session focus with knee mobility and proximal strengthening.  Added exercises to improve knee extension with good tolerance.  Added heel prop on coffee table and prone knee hang to HEP with printout given.  AROM today 11-85 degrees.  EOS with manual decongestive massage to assist with edema control.  Pt currently wearing TED  hose on Lt LE.  Discussed compression garments to assist with edema, pt stated she has ordered a pair from [place], just waiting for arrival.    Eval:Patient is a 53 y.o. female who was seen today for physical therapy evaluation and treatment for status post Left TKA, 05/05/2023. Pt demonstrates limited left knee ROM and strength, limited by pain. Pt demonstrates difficulty with ambulation and requires RW. Pt demonstrates decreased gait speed and efficiency due to pain and RW height, RW was adjusted and pt was educated on DME. Patient would continue to benefit from skilled physical therapy to increase pain free L knee ROM, increased LLE strength for improved quality of life, community ambulation and continued progress towards therapy goals.   OBJECTIVE IMPAIRMENTS: Abnormal gait, decreased activity tolerance, decreased balance, decreased coordination, decreased knowledge of use of DME, decreased mobility, difficulty walking, decreased ROM, decreased strength, increased edema, and pain.   ACTIVITY LIMITATIONS: carrying, bending, standing, squatting, sleeping, stairs, and bed mobility  PARTICIPATION LIMITATIONS: meal prep, cleaning, laundry, shopping, community activity, occupation, and yard work  PERSONAL FACTORS: Age, Past/current experiences, and 1 comorbidity: had to stay a bit longer in the hospital following sx due to temperature and BP changes  are also  affecting patient's functional outcome.   REHAB POTENTIAL: Good  CLINICAL DECISION MAKING: Stable/uncomplicated  EVALUATION COMPLEXITY: Low   GOALS: Goals reviewed with patient? No  SHORT TERM GOALS: Target date: 06/02/23  Patient will demonstrate evidence of independence with individualized HEP and will report compliance for at least 3 days per week for optimized progression towards remaining therapy goals. Baseline: following post op exercises Goal status: INITIAL  2.  Patient will report a decrease in pain level during community  ambulation by at least 2 points for improved quality of life. Baseline: 4/10 Goal status: INITIAL     LONG TERM GOALS: Target date: 06/23/23  Pt will demonstrate a an increase of at least 9 points on the LEFS for improved performance of community ambulation and ADL. Baseline: TBA next session Goal status: INITIAL  2.  Pt will improve 2 MWT to at least 578 feet in order to demonstrate age norms for functional ambulatory capacity in community setting.  Baseline: TBA as pt tolerates post op soreness Goal status: INITIAL  3.  Pt will demonstrate WFL ROM (flexion and extension) in left knee, for increased mobility and maximal efficiency of gait cycle during ambulation. Baseline: see objective Goal status: INITIAL  4.  Pt will demonstrate at least 4-/5 MMT for left lower extremity for increased strength during ADL and community ambulation. Baseline: TBA when pain reduces Goal status: INITIAL     PLAN:  PT FREQUENCY: 2x/week  PT DURATION: 6 weeks  PLANNED INTERVENTIONS: 97110-Therapeutic exercises, 97530- Therapeutic activity, [id number]- Neuromuscular re-education, 97535- Self Care, and [id number]- Manual therapy  PLAN FOR NEXT SESSION: Knee mobilty and progress LLE strengthening.    Becky Sax, LPTA/CLT; CBIS [phone]  11:56 AM, 05/26/23

## 2023-05-30 ENCOUNTER — Other Ambulatory Visit: Payer: Self-pay | Admitting: Orthopaedic Surgery

## 2023-05-30 MED ORDER — OXYCODONE HCL 5 MG PO TABS: 5.0000 mg | ORAL_TABLET | Freq: Four times a day (QID) | ORAL | 0 refills | Status: DC | PRN

## 2023-05-31 ENCOUNTER — Encounter (HOSPITAL_COMMUNITY)

## 2023-06-02 ENCOUNTER — Encounter (HOSPITAL_COMMUNITY): Payer: Self-pay

## 2023-06-02 ENCOUNTER — Ambulatory Visit (HOSPITAL_COMMUNITY)

## 2023-06-02 DIAGNOSIS — M25562 Pain in left knee: Secondary | ICD-10-CM | POA: Diagnosis not present

## 2023-06-02 DIAGNOSIS — Z96652 Presence of left artificial knee joint: Secondary | ICD-10-CM

## 2023-06-02 DIAGNOSIS — R262 Difficulty in walking, not elsewhere classified: Secondary | ICD-10-CM | POA: Diagnosis not present

## 2023-06-02 DIAGNOSIS — G8918 Other acute postprocedural pain: Secondary | ICD-10-CM | POA: Diagnosis not present

## 2023-06-02 DIAGNOSIS — M25661 Stiffness of right knee, not elsewhere classified: Secondary | ICD-10-CM | POA: Diagnosis not present

## 2023-06-02 DIAGNOSIS — G8929 Other chronic pain: Secondary | ICD-10-CM | POA: Diagnosis not present

## 2023-06-02 DIAGNOSIS — M25561 Pain in right knee: Secondary | ICD-10-CM | POA: Diagnosis not present

## 2023-06-02 DIAGNOSIS — M1711 Unilateral primary osteoarthritis, right knee: Secondary | ICD-10-CM | POA: Diagnosis not present

## 2023-06-02 NOTE — Therapy (Addendum)
 OUTPATIENT PHYSICAL THERAPY LOWER EXTREMITY TREATMENT   Patient Name: Carrie [name] MRN: [id number] DOB:13-Oct-1970, 53 y.o., female Today's Date: 06/02/2023  END OF SESSION:  PT End of Session - 06/02/23 0805     Visit Number 6    Number of Visits 12    Date for PT Re-Evaluation 06/23/23    Authorization Type [employer], no auth required    PT Start Time 0801    PT Stop Time 0840    PT Time Calculation (min) 39 min    Equipment Utilized During Treatment Gait belt    Activity Tolerance Patient tolerated treatment well    Behavior During Therapy WFL for tasks assessed/performed                Past Medical History:  Diagnosis Date   Arthritis    Arthrofibrosis of right total knee arthroplasty, subsequent encounter 05/20/2021   Diabetes mellitus without complication (HCC)    type 2   Essential hypertension    GERD (gastroesophageal reflux disease)    Headache    migraine   Hypothyroidism    Mixed hyperlipidemia    PONV (postoperative nausea and vomiting)    Really Sick!!! Had to stay in the hospital   Past Surgical History:  Procedure Laterality Date   ABDOMINAL HYSTERECTOMY     BIOPSY  04/25/2022   Procedure: BIOPSY;  Surgeon: Shaaron Lamar HERO, MD;  Location: [facility];  Service: Endoscopy;;   COLONOSCOPY WITH PROPOFOL  N/A 04/25/2022   Procedure: COLONOSCOPY WITH PROPOFOL ;  Surgeon: Shaaron Lamar HERO, MD;  Location: [facility];  Service: Endoscopy;  Laterality: N/A;  8:45 am, asa 2   ESOPHAGOGASTRODUODENOSCOPY (EGD) WITH PROPOFOL  N/A 04/25/2022   Procedure: ESOPHAGOGASTRODUODENOSCOPY (EGD) WITH PROPOFOL ;  Surgeon: Shaaron Lamar HERO, MD;  Location: [facility];  Service: Endoscopy;  Laterality: N/A;   HARDWARE REMOVAL Right 04/02/2021   Procedure: Right Knee HARDWARE REMOVAL;  Surgeon: Vernetta Lonni GRADE, MD;  Location: [facility];  Service: Orthopedics;  Laterality: Right;   KNEE ARTHROSCOPY  02/15/2007   KNEE CLOSED REDUCTION Right 05/20/2021   Procedure:  CLOSED MANIPULATION RIGHT KNEE;  Surgeon: Vernetta Lonni GRADE, MD;  Location: [facility];  Service: Orthopedics;  Laterality: Right;   MALONEY DILATION N/A 04/25/2022   Procedure: AGAPITO DILATION;  Surgeon: Shaaron Lamar HERO, MD;  Location: [facility];  Service: Endoscopy;  Laterality: N/A;   MASS EXCISION  02/11/2011   Procedure: EXCISION MASS;  Surgeon: Lynwood FORBES Better, MD;  Location: [facility];  Service: Orthopedics;  Laterality: Right;  resection of neuroma right infrapatellar medial knee   PATELLA ARTHROPLASTY     rt knee-   POLYPECTOMY  04/25/2022   Procedure: POLYPECTOMY;  Surgeon: Shaaron Lamar HERO, MD;  Location: [facility];  Service: Endoscopy;;   REDUCTION MAMMAPLASTY Bilateral 2017   TOTAL KNEE ARTHROPLASTY  02/14/2009   partial-[facility]    TOTAL KNEE ARTHROPLASTY Right 04/02/2021   Procedure: Right TOTAL KNEE ARTHROPLASTY;  Surgeon: Vernetta Lonni GRADE, MD;  Location: [facility];  Service: Orthopedics;  Laterality: Right;   TOTAL KNEE ARTHROPLASTY Left 05/05/2023   Procedure: LEFT TOTAL KNEE ARTHROPLASTY;  Surgeon: Vernetta Lonni GRADE, MD;  Location: [facility];  Service: Orthopedics;  Laterality: Left;   TUBAL LIGATION     Patient Active Problem List   Diagnosis Date Noted   Status post total left knee replacement 05/05/2023   Encounter for screening colonoscopy 04/04/2022   Esophageal dysphagia 04/04/2022   Constipation  04/04/2022   Arthrofibrosis of right total knee arthroplasty, subsequent encounter 05/20/2021   Pain in right knee 04/19/2021   Stiffness of right knee, not elsewhere classified 04/19/2021   Difficulty in walking, not elsewhere classified 04/19/2021   Status post total knee replacement, right 04/02/2021   Allergic rhinitis due to pollen 06/22/2020   Eosinophilic esophagitis 06/22/2020   Rash and other nonspecific skin eruption 06/22/2020   AC joint arthropathy 01/04/2017   Acute pain of right shoulder 11/15/2016    Other derangements of patella, unspecified knee 09/02/2015   Recurrent dislocation of right patella 09/02/2015   Breast hypertrophy in female 05/13/2015   Neuroma of lower extremity 02/11/2011    Class: Chronic    PCP: Leonce Lucie PARAS, PA-C   REFERRING PROVIDER: Vernetta Lonni GRADE, MD Next apt: 05/18/23  REFERRING DIAG: S03.347 (ICD-10-CM) - Status post total left knee replacement M17.12 (ICD-10-CM) - Unilateral primary osteoarthritis, left knee  THERAPY DIAG:  Acute postoperative pain of left knee  Status post total left knee replacement  Rationale for Evaluation and Treatment: Rehabilitation  ONSET DATE: 05/05/2023  SUBJECTIVE:   SUBJECTIVE STATEMENT: Pt reports knee does not want to straighten today. Pt has been nauseated and has stopped taking pain med to see if that would help. Pain reported 6/10 this morning.   Eval:  Pt reports extended stay in hospital following sx due to BP changes and having a temperature. Pt reports being independent with all household and community ambulation with no need for AD but was in significant amount of pain. Pt would like to be able to decrease the pain level and return to being able to get up and go. Pt reports taking compression sleeve off this morning due to pain.  PERTINENT HISTORY: Surgery was a week ago today, 05/05/23. Pt has hx of R TKA 2 years ago.  PAIN:  Are you having pain? Yes: NPRS scale: 3/10 Pain location: left knee all around Pain description: dull  Aggravating factors: walking, standing Relieving factors: sitting, iceman  PRECAUTIONS: Knee  RED FLAGS: None   WEIGHT BEARING RESTRICTIONS: No  FALLS:  Has patient fallen in last 6 months? No  LIVING ENVIRONMENT: Lives with: lives with their spouse and lives with their daughter Lives in: House/apartment Stairs: Yes: External: 1 steps; none Has following equipment at home: Single point cane and Crutches  OCCUPATION: tax office  PLOF: Independent with  household mobility without device, Independent with community mobility without device, and pain with stairs  PATIENT GOALS: get rid of pain, wants to get her life back, walk dogs, wants to get up and go  NEXT MD VISIT: 05/18/23  OBJECTIVE:  Note: Objective measures were completed at Evaluation unless otherwise noted.  DIAGNOSTIC FINDINGS:  Radiographs 3/21: CLINICAL DATA:  Status post total left knee arthroplasty.   EXAM: PORTABLE LEFT KNEE - 1-2 VIEW   COMPARISON:  Left knee radiographs 01/09/2023   FINDINGS: Interval total left knee arthroplasty. No perihardware lucency is seen to indicate hardware failure or loosening. Expected postoperative changes including intra-articular and subcutaneous air. Smalljoint effusion. Anterior surgical skin staples. No acute fracture or dislocation.   IMPRESSION: Interval total left knee arthroplasty without evidence of hardware failure.  PATIENT SURVEYS:  LEFS 30/80 = 37.5%  COGNITION: Overall cognitive status: Within functional limits for tasks assessed     SENSATION: [facility] Not tested  EDEMA:  Pt demonstrates increased swelling in LLE with pt reporting, she took compression sleeve off this morning due to pain. Pt denies throbbing pain  and is educated on the risk of DVT and importance of inflammation control.  PALPATION: Pain/tenderness/soreness reported during palpation of L knee and mid calf region.   LOWER EXTREMITY ROM:  Active ROM Right eval Left eval Left 05/23/23  Hip flexion     Hip extension     Hip abduction     Hip adduction     Hip internal rotation     Hip external rotation     Knee flexion 109 60 77  Knee extension 4 15 -10 (lacking)  Ankle dorsiflexion     Ankle plantarflexion     Ankle inversion     Ankle eversion      (Blank rows = not tested)  LOWER EXTREMITY MMT: not formally assessed upon eval due to pain  MMT Right eval Left eval  Hip flexion WFL   Hip extension [facility]   Hip abduction    Hip  adduction    Hip internal rotation    Hip external rotation    Knee flexion WFL   Knee extension WFL   Ankle dorsiflexion WFL   Ankle plantarflexion WFL   Ankle inversion    Ankle eversion     (Blank rows = not tested)   FUNCTIONAL TESTS:  5 times sit to stand: TBA 2 minute walk test: 226 ft with RW with slow cadence, decreased stride length and step length bilaterally, antalgic  30 second sit to stand 7 times in split stance with UE assistance from chair GAIT: Distance walked: 100 Assistive device utilized: [employer] - 2 wheeled Level of assistance: Modified independence Comments: decreased speed, decreased stride length and step length bilaterally, antalgic                                                                                                                                TREATMENT DATE:  06/02/2023  Therapeutic Exercise: -Bike seat 6 x 5' rocking -Calf raises, 2 sets of 10 reps -Aeromat lateral stepping and heel toe walking, 5 laps each, 2 lengths of aeromat, pt cued for proper form -Forward lunge on bosu ball with bilateral UE support and 10 second holds for flexion and extension, pt cued for pain free ROM, 2 sets of 10 reps -Slant board 3x 30 -Hamstring stretch, 2 sets of 30 second holds -3 way hip, 1 set of 5 reps, bilaterally -Lateral stepping 1 laps 20 feet per lap, pt cued for upright posture  Therapeutic Activity: -Sit to stands, 2 sets of 10 reps, pt cued for core activation -Step up and overs, 2 sets of 5 reps, 4 inch step -Lateral step up and overs, 4 inch step -TKE on 2 inch step, 2 sets of 10 reps    05/26/23: Gait training with cueing for heel strike and toe push off x259ft no AD Bike seat 6 x 5' rocking Standing: Knee drives on 12in step 5x 10 holds for flexion Heel raises on incline slope 15x 5 Toe  raises on decline slope 15x 5 TKE with RTB 10x5 Hamstring stretch on 12in step 3x 30 Slant board 3x 30 Prone:  Prone knee hang x 2  min  Manual:  Decongestive massage with LE elevated, STM to quadriceps in supine position with LE elevated.  05/23/23 STM to left knee to decrease pain and improve soft tissue extensibility followed by manual ROM x 15 AAROM left knee -10 to 77 Bike seat 6 x 6' rocking Standing: 8 box left knee drives for flexion x 3' Heel raises 2 x 10 Calf/gastroc stretch 10 x 10 Mini squats x 10    PATIENT EDUCATION:  Education details: Pt was educated on findings of PT evaluation, prognosis, frequency of therapy visits and rationale, attendance policy, and HEP if given.   Person educated: Patient Education method: Explanation Education comprehension: verbalized understanding and needs further education  HOME EXERCISE PROGRAM: Access Code: [email or web address] URL: [email or web address] Date: 05/12/2023 Prepared by: Lang Ada  Exercises - Supine Ankle Pumps - 3 x daily - 1 sets - 15-20 reps - Supine Hip Abduction - 3 x daily - 1 sets - 15-20 reps - Supine Quad Set - 3 x daily - 1 sets - 15-20 reps - 3 sec hold - Supine Heel Slide with Strap - 3 x daily - 3 sets - 15-20 reps - 3 sec hold   - Prone Knee Extension Hang  - 2 x daily - 7 x weekly - 1 sets - 2 reps - 1'+ hold - Prone Quadriceps Stretch with Strap  - 2 x daily - 7 x weekly - 1 sets - 3 reps - 30 hold - Supine Knee Extension Mobilization with Weight  - 2 x daily - 7 x weekly - 1 sets - 1'+ hold  ASSESSMENT:  CLINICAL IMPRESSION: Patient continues to demonstrate decreased LLE strength, decreased gait quality and balance especially SLS on LLE. Patient also demonstrates decreased ROM with Left knee in flexion and extension. Patient able to initiate dynamic balance and core activation exercises today with aero mat walking and step ups, good performance with no increased pain with verbal cueing. Patient would continue to benefit from skilled physical therapy for increased endurance with ambulation, increased LE strength, and  improved balance for improved quality of life, improved independence with gait training and continued progress towards therapy goals.   Eval:Patient is a 53 y.o. female who was seen today for physical therapy evaluation and treatment for status post Left TKA, 05/05/2023. Pt demonstrates limited left knee ROM and strength, limited by pain. Pt demonstrates difficulty with ambulation and requires RW. Pt demonstrates decreased gait speed and efficiency due to pain and RW height, RW was adjusted and pt was educated on DME. Patient would continue to benefit from skilled physical therapy to increase pain free L knee ROM, increased LLE strength for improved quality of life, community ambulation and continued progress towards therapy goals.   OBJECTIVE IMPAIRMENTS: Abnormal gait, decreased activity tolerance, decreased balance, decreased coordination, decreased knowledge of use of DME, decreased mobility, difficulty walking, decreased ROM, decreased strength, increased edema, and pain.   ACTIVITY LIMITATIONS: carrying, bending, standing, squatting, sleeping, stairs, and bed mobility  PARTICIPATION LIMITATIONS: meal prep, cleaning, laundry, shopping, community activity, occupation, and yard work  PERSONAL FACTORS: Age, Past/current experiences, and 1 comorbidity: had to stay a bit longer in the hospital following sx due to temperature and BP changes  are also affecting patient's functional outcome.   REHAB POTENTIAL: Good  CLINICAL DECISION MAKING: Stable/uncomplicated  EVALUATION COMPLEXITY:  Low   GOALS: Goals reviewed with patient? No  SHORT TERM GOALS: Target date: 06/02/23  Patient will demonstrate evidence of independence with individualized HEP and will report compliance for at least 3 days per week for optimized progression towards remaining therapy goals. Baseline: following post op exercises Goal status: INITIAL  2.  Patient will report a decrease in pain level during community ambulation  by at least 2 points for improved quality of life. Baseline: 4/10 Goal status: INITIAL     LONG TERM GOALS: Target date: 06/23/23  Pt will demonstrate a an increase of at least 9 points on the LEFS for improved performance of community ambulation and ADL. Baseline: TBA next session Goal status: INITIAL  2.  Pt will improve 2 MWT to at least 578 feet in order to demonstrate age norms for functional ambulatory capacity in community setting.  Baseline: TBA as pt tolerates post op soreness Goal status: INITIAL  3.  Pt will demonstrate WFL ROM (flexion and extension) in left knee, for increased mobility and maximal efficiency of gait cycle during ambulation. Baseline: see objective Goal status: INITIAL  4.  Pt will demonstrate at least 4-/5 MMT for left lower extremity for increased strength during ADL and community ambulation. Baseline: TBA when pain reduces Goal status: INITIAL     PLAN:  PT FREQUENCY: 2x/week  PT DURATION: 6 weeks  PLANNED INTERVENTIONS: 97110-Therapeutic exercises, 97530- Therapeutic activity, [id number]- Neuromuscular re-education, 97535- Self Care, and [id number]- Manual therapy  PLAN FOR NEXT SESSION: Knee mobilty and progress LLE strengthening.  Progress step ups and dynamic balance activities to pt toleration.  Lang Ada, PT, DPT [facility] Office: [phone] 8:48 AM, 06/02/23

## 2023-06-06 ENCOUNTER — Encounter (HOSPITAL_COMMUNITY): Payer: Self-pay

## 2023-06-06 ENCOUNTER — Ambulatory Visit (HOSPITAL_COMMUNITY)

## 2023-06-06 DIAGNOSIS — G8929 Other chronic pain: Secondary | ICD-10-CM | POA: Diagnosis not present

## 2023-06-06 DIAGNOSIS — M25561 Pain in right knee: Secondary | ICD-10-CM | POA: Diagnosis not present

## 2023-06-06 DIAGNOSIS — Z96652 Presence of left artificial knee joint: Secondary | ICD-10-CM | POA: Diagnosis not present

## 2023-06-06 DIAGNOSIS — G8918 Other acute postprocedural pain: Secondary | ICD-10-CM

## 2023-06-06 DIAGNOSIS — R262 Difficulty in walking, not elsewhere classified: Secondary | ICD-10-CM | POA: Diagnosis not present

## 2023-06-06 DIAGNOSIS — M25562 Pain in left knee: Secondary | ICD-10-CM | POA: Diagnosis not present

## 2023-06-06 DIAGNOSIS — M1711 Unilateral primary osteoarthritis, right knee: Secondary | ICD-10-CM | POA: Diagnosis not present

## 2023-06-06 DIAGNOSIS — M25661 Stiffness of right knee, not elsewhere classified: Secondary | ICD-10-CM | POA: Diagnosis not present

## 2023-06-06 NOTE — Therapy (Signed)
 OUTPATIENT PHYSICAL THERAPY LOWER EXTREMITY TREATMENT   Patient Name: Carrie [name] MRN: [id number] DOB:1970/03/26, 53 y.o., female Today's Date: 06/06/2023  END OF SESSION:  PT End of Session - 06/06/23 1142     Visit Number 7    Number of Visits 12    Date for PT Re-Evaluation 06/23/23    Authorization Type [employer], no auth required    PT Start Time 1144    PT Stop Time 1227    PT Time Calculation (min) 43 min    Equipment Utilized During Treatment Gait belt    Activity Tolerance Patient tolerated treatment well    Behavior During Therapy WFL for tasks assessed/performed                 Past Medical History:  Diagnosis Date   Arthritis    Arthrofibrosis of right total knee arthroplasty, subsequent encounter 05/20/2021   Diabetes mellitus without complication (HCC)    type 2   Essential hypertension    GERD (gastroesophageal reflux disease)    Headache    migraine   Hypothyroidism    Mixed hyperlipidemia    PONV (postoperative nausea and vomiting)    Really Sick!!! Had to stay in the hospital   Past Surgical History:  Procedure Laterality Date   ABDOMINAL HYSTERECTOMY     BIOPSY  04/25/2022   Procedure: BIOPSY;  Surgeon: Suzette Espy, MD;  Location: [facility];  Service: Endoscopy;;   COLONOSCOPY WITH PROPOFOL  N/A 04/25/2022   Procedure: COLONOSCOPY WITH PROPOFOL ;  Surgeon: Suzette Espy, MD;  Location: [facility];  Service: Endoscopy;  Laterality: N/A;  8:45 am, asa 2   ESOPHAGOGASTRODUODENOSCOPY (EGD) WITH PROPOFOL  N/A 04/25/2022   Procedure: ESOPHAGOGASTRODUODENOSCOPY (EGD) WITH PROPOFOL ;  Surgeon: Suzette Espy, MD;  Location: [facility];  Service: Endoscopy;  Laterality: N/A;   HARDWARE REMOVAL Right 04/02/2021   Procedure: Right Knee HARDWARE REMOVAL;  Surgeon: Arnie Lao, MD;  Location: [facility];  Service: Orthopedics;  Laterality: Right;   KNEE ARTHROSCOPY  02/15/2007   KNEE CLOSED REDUCTION Right 05/20/2021   Procedure:  CLOSED MANIPULATION RIGHT KNEE;  Surgeon: Arnie Lao, MD;  Location: [facility];  Service: Orthopedics;  Laterality: Right;   MALONEY DILATION N/A 04/25/2022   Procedure: Londa Rival DILATION;  Surgeon: Suzette Espy, MD;  Location: [facility];  Service: Endoscopy;  Laterality: N/A;   MASS EXCISION  02/11/2011   Procedure: EXCISION MASS;  Surgeon: Alphonso Jean, MD;  Location: [facility];  Service: Orthopedics;  Laterality: Right;  resection of neuroma right infrapatellar medial knee   PATELLA ARTHROPLASTY     rt knee-   POLYPECTOMY  04/25/2022   Procedure: POLYPECTOMY;  Surgeon: Suzette Espy, MD;  Location: [facility];  Service: Endoscopy;;   REDUCTION MAMMAPLASTY Bilateral 2017   TOTAL KNEE ARTHROPLASTY  02/14/2009   partial-Steele reg    TOTAL KNEE ARTHROPLASTY Right 04/02/2021   Procedure: Right TOTAL KNEE ARTHROPLASTY;  Surgeon: Arnie Lao, MD;  Location: [facility];  Service: Orthopedics;  Laterality: Right;   TOTAL KNEE ARTHROPLASTY Left 05/05/2023   Procedure: LEFT TOTAL KNEE ARTHROPLASTY;  Surgeon: Arnie Lao, MD;  Location: [facility];  Service: Orthopedics;  Laterality: Left;   TUBAL LIGATION     Patient Active Problem List   Diagnosis Date Noted   Status post total left knee replacement 05/05/2023   Encounter for screening colonoscopy 04/04/2022   Esophageal dysphagia 04/04/2022  Constipation 04/04/2022   Arthrofibrosis of right total knee arthroplasty, subsequent encounter 05/20/2021   Pain in right knee 04/19/2021   Stiffness of right knee, not elsewhere classified 04/19/2021   Difficulty in walking, not elsewhere classified 04/19/2021   Status post total knee replacement, right 04/02/2021   Allergic rhinitis due to pollen 06/22/2020   Eosinophilic esophagitis 06/22/2020   Rash and other nonspecific skin eruption 06/22/2020   AC joint arthropathy 01/04/2017   Acute pain of right shoulder 11/15/2016    Other derangements of patella, unspecified knee 09/02/2015   Recurrent dislocation of right patella 09/02/2015   Breast hypertrophy in female 05/13/2015   Neuroma of lower extremity 02/11/2011    Class: Chronic    PCP: [name], PA-C   REFERRING PROVIDER: Arnie Lao, MD Next apt: 05/18/23  REFERRING DIAG: R60.454 (ICD-10-CM) - Status post total left knee replacement M17.12 (ICD-10-CM) - Unilateral primary osteoarthritis, left knee  THERAPY DIAG:  Acute postoperative pain of left knee  Status post total left knee replacement  Rationale for Evaluation and Treatment: Rehabilitation  ONSET DATE: 05/05/2023  SUBJECTIVE:   SUBJECTIVE STATEMENT: Pt reports knee continue to feel tight. Pt reports compliance with HEP.  Eval:  Pt reports extended stay in hospital following sx due to BP changes and having a temperature. Pt reports being independent with all household and community ambulation with no need for AD but was in significant amount of pain. Pt would like to be able to decrease the pain level and return to being able to "get up and go." Pt reports taking compression sleeve off this morning due to pain.  PERTINENT HISTORY: Surgery was a week ago today, 05/05/23. Pt has hx of R TKA 2 years ago.  PAIN:  Are you having pain? Yes: NPRS scale: 3/10 Pain location: left knee all around Pain description: dull  Aggravating factors: walking, standing Relieving factors: sitting, iceman  PRECAUTIONS: Knee  RED FLAGS: None   WEIGHT BEARING RESTRICTIONS: No  FALLS:  Has patient fallen in last 6 months? No  LIVING ENVIRONMENT: Lives with: lives with their spouse and lives with their daughter Lives in: House/apartment Stairs: Yes: External: 1 steps; none Has following equipment at home: Single point cane and Crutches  OCCUPATION: tax office  PLOF: Independent with household mobility without device, Independent with community mobility without device, and pain  with stairs  PATIENT GOALS: get rid of pain, wants to get her life back, walk dogs, wants to get up and go  NEXT MD VISIT: 05/18/23  OBJECTIVE:  Note: Objective measures were completed at Evaluation unless otherwise noted.  DIAGNOSTIC FINDINGS:  Radiographs 3/21: CLINICAL DATA:  Status post total left knee arthroplasty.   EXAM: PORTABLE LEFT KNEE - 1-2 VIEW   COMPARISON:  Left knee radiographs 01/09/2023   FINDINGS: Interval total left knee arthroplasty. No perihardware lucency is seen to indicate hardware failure or loosening. Expected postoperative changes including intra-articular and subcutaneous air. Smalljoint effusion. Anterior surgical skin staples. No acute fracture or dislocation.   IMPRESSION: Interval total left knee arthroplasty without evidence of hardware failure.  PATIENT SURVEYS:  LEFS 30/80 = 37.5%  COGNITION: Overall cognitive status: Within functional limits for tasks assessed     SENSATION: [facility] Not tested  EDEMA:  Pt demonstrates increased swelling in LLE with pt reporting, she took compression sleeve off this morning due to pain. Pt denies throbbing pain and is educated on the risk of DVT and importance of inflammation control.  PALPATION: Pain/tenderness/soreness reported during  palpation of L knee and mid calf region.   LOWER EXTREMITY ROM:  Active ROM Right eval Left eval Left 05/23/23  Hip flexion     Hip extension     Hip abduction     Hip adduction     Hip internal rotation     Hip external rotation     Knee flexion 109 60 77  Knee extension 4 15 -10 (lacking)  Ankle dorsiflexion     Ankle plantarflexion     Ankle inversion     Ankle eversion      (Blank rows = not tested)  LOWER EXTREMITY MMT: not formally assessed upon eval due to pain  MMT Right eval Left eval  Hip flexion WFL   Hip extension [facility]   Hip abduction    Hip adduction    Hip internal rotation    Hip external rotation    Knee flexion WFL   Knee extension  WFL   Ankle dorsiflexion WFL   Ankle plantarflexion WFL   Ankle inversion    Ankle eversion     (Blank rows = not tested)   FUNCTIONAL TESTS:  5 times sit to stand: TBA 2 minute walk test: 226 ft with RW with slow cadence, decreased stride length and step length bilaterally, antalgic  30 second sit to stand 7 times in split stance with UE assistance from chair GAIT: Distance walked: 100 Assistive device utilized: Environmental consultant - 2 wheeled Level of assistance: Modified independence Comments: decreased speed, decreased stride length and step length bilaterally, antalgic                                                                                                                                TREATMENT DATE:  06/06/2023  Therapeutic Exercise: -Bike seat 6 x 5' rocking, able to do complete turn once -Calf raises, 2 sets of 10 reps -Aeromat lateral stepping and heel toe walking, 3 laps each, 3 lengths of aeromat, pt cued for decreased UE support -3 way hip, 2 set of 5 reps, bilaterally, 3# AW -Resisted walking 3# AW, walking marches/butt kicks, 3 laps, 60 foot laps. -Slant board 3x 30" -Hamstring stretch, 2 sets of 30 second holds -Knee drive stretch on 18 inch step, 10 reps for 10 second holds   Therapeutic Activity: -Step up and overs, 2 sets of 5 reps, 6 inch step -Lateral step up and overs, 2 sets of 5 reps, 6 inch step -TKE on 4 inch step, 3 sets of 6 reps    06/02/2023  Therapeutic Exercise: -Bike seat 6 x 5' rocking -Calf raises, 2 sets of 10 reps -Aeromat lateral stepping and heel toe walking, 5 laps each, 2 lengths of aeromat, pt cued for proper form -Forward lunge on bosu ball with bilateral UE support and 10 second holds for flexion and extension, pt cued for pain free ROM, 2 sets of 10 reps -Slant board 3x 30" -Hamstring stretch, 2 sets  of 30 second holds -3 way hip, 1 set of 5 reps, bilaterally -Lateral stepping 1 laps 20 feet per lap, pt cued for upright  posture  Therapeutic Activity: -Sit to stands, 2 sets of 10 reps, pt cued for core activation -Step up and overs, 2 sets of 5 reps, 4 inch step -Lateral step up and overs, 4 inch step -TKE on 2 inch step, 2 sets of 10 reps    05/26/23: Gait training with cueing for heel strike and toe push off x266ft no AD Bike seat 6 x 5' rocking Standing: Knee drives on 12in step 5x 10" holds for flexion Heel raises on incline slope 15x 5" Toe raises on decline slope 15x 5" TKE with RTB 10x5" Hamstring stretch on 12in step 3x 30" Slant board 3x 30" Prone:  Prone knee hang x 2 min  Manual:  Decongestive massage with LE elevated, STM to quadriceps in supine position with LE elevated.  05/23/23 STM to left knee to decrease pain and improve soft tissue extensibility followed by manual ROM x 15 AAROM left knee -10 to 77 Bike seat 6 x 6' rocking Standing: 8" box left knee drives for flexion x 3' Heel raises 2 x 10 Calf/gastroc stretch 10" x 10 Mini squats x 10    PATIENT EDUCATION:  Education details: Pt was educated on findings of PT evaluation, prognosis, frequency of therapy visits and rationale, attendance policy, and HEP if given.   Person educated: Patient Education method: Explanation Education comprehension: verbalized understanding and needs further education  HOME EXERCISE PROGRAM: Access Code: [email or web address] URL: [email or web address] Date: 05/12/2023 Prepared by: Armond Bertin  Exercises - Supine Ankle Pumps - 3 x daily - 1 sets - 15-20 reps - Supine Hip Abduction - 3 x daily - 1 sets - 15-20 reps - Supine Quad Set - 3 x daily - 1 sets - 15-20 reps - 3 sec hold - Supine Heel Slide with Strap - 3 x daily - 3 sets - 15-20 reps - 3 sec hold   - Prone Knee Extension Hang  - 2 x daily - 7 x weekly - 1 sets - 2 reps - 1'+ hold - Prone Quadriceps Stretch with Strap  - 2 x daily - 7 x weekly - 1 sets - 3 reps - 30" hold - Supine Knee Extension Mobilization with Weight  - 2  x daily - 7 x weekly - 1 sets - 1'+ hold  ASSESSMENT:  CLINICAL IMPRESSION: Patient continues to demonstrate decreased L knee ROM and LLE strength, decreased gait quality and balance. Patient also demonstrates decreased performance in exercises and activities involving knee flexion, only 85 degrees observed during measuring. Pt does demonstrate to complete one full rotation on bike during today's session with max effort and increased pain. Patient able to progress dynamic balance and core activation exercises today with resisted walking and aeromat walks, good performance with verbal cueing for decreased UE support. Patient would continue to benefit from skilled physical therapy for decreased knee pain, increased L knee mobility, increased LLE strength, and improved balance for improved quality of life, improved gait quality and continued progress towards therapy goals.    Eval:Patient is a 53 y.o. female who was seen today for physical therapy evaluation and treatment for status post Left TKA, 05/05/2023. Pt demonstrates limited left knee ROM and strength, limited by pain. Pt demonstrates difficulty with ambulation and requires RW. Pt demonstrates decreased gait speed and efficiency due to pain and RW height, RW was adjusted and  pt was educated on DME. Patient would continue to benefit from skilled physical therapy to increase pain free L knee ROM, increased LLE strength for improved quality of life, community ambulation and continued progress towards therapy goals.   OBJECTIVE IMPAIRMENTS: Abnormal gait, decreased activity tolerance, decreased balance, decreased coordination, decreased knowledge of use of DME, decreased mobility, difficulty walking, decreased ROM, decreased strength, increased edema, and pain.   ACTIVITY LIMITATIONS: carrying, bending, standing, squatting, sleeping, stairs, and bed mobility  PARTICIPATION LIMITATIONS: meal prep, cleaning, laundry, shopping, community activity,  occupation, and yard work  PERSONAL FACTORS: Age, Past/current experiences, and 1 comorbidity: had to stay a bit longer in the hospital following sx due to temperature and BP changes  are also affecting patient's functional outcome.   REHAB POTENTIAL: Good  CLINICAL DECISION MAKING: Stable/uncomplicated  EVALUATION COMPLEXITY: Low   GOALS: Goals reviewed with patient? No  SHORT TERM GOALS: Target date: 06/02/23  Patient will demonstrate evidence of independence with individualized HEP and will report compliance for at least 3 days per week for optimized progression towards remaining therapy goals. Baseline: following post op exercises Goal status: INITIAL  2.  Patient will report a decrease in pain level during community ambulation by at least 2 points for improved quality of life. Baseline: 4/10 Goal status: INITIAL     LONG TERM GOALS: Target date: 06/23/23  Pt will demonstrate a an increase of at least 9 points on the LEFS for improved performance of community ambulation and ADL. Baseline: TBA next session Goal status: INITIAL  2.  Pt will improve 2 MWT to at least 578 feet in order to demonstrate age norms for functional ambulatory capacity in community setting.  Baseline: TBA as pt tolerates post op soreness Goal status: INITIAL  3.  Pt will demonstrate WFL ROM (flexion and extension) in left knee, for increased mobility and maximal efficiency of gait cycle during ambulation. Baseline: see objective Goal status: INITIAL  4.  Pt will demonstrate at least 4-/5 MMT for left lower extremity for increased strength during ADL and community ambulation. Baseline: TBA when pain reduces Goal status: INITIAL     PLAN:  PT FREQUENCY: 2x/week  PT DURATION: 6 weeks  PLANNED INTERVENTIONS: 97110-Therapeutic exercises, 97530- Therapeutic activity, [id number]- Neuromuscular re-education, 97535- Self Care, and [id number]- Manual therapy  PLAN FOR NEXT SESSION: Knee mobilty and progress  LLE strengthening.  Progress step ups and dynamic balance activities to pt toleration.  Armond Bertin, PT, DPT [facility] Office: [phone] 12:32 PM, 06/06/23

## 2023-06-09 ENCOUNTER — Ambulatory Visit (HOSPITAL_COMMUNITY)

## 2023-06-09 DIAGNOSIS — M25661 Stiffness of right knee, not elsewhere classified: Secondary | ICD-10-CM | POA: Diagnosis not present

## 2023-06-09 DIAGNOSIS — R262 Difficulty in walking, not elsewhere classified: Secondary | ICD-10-CM

## 2023-06-09 DIAGNOSIS — Z96652 Presence of left artificial knee joint: Secondary | ICD-10-CM | POA: Diagnosis not present

## 2023-06-09 DIAGNOSIS — M25561 Pain in right knee: Secondary | ICD-10-CM | POA: Diagnosis not present

## 2023-06-09 DIAGNOSIS — G8918 Other acute postprocedural pain: Secondary | ICD-10-CM | POA: Diagnosis not present

## 2023-06-09 DIAGNOSIS — M1711 Unilateral primary osteoarthritis, right knee: Secondary | ICD-10-CM | POA: Diagnosis not present

## 2023-06-09 DIAGNOSIS — G8929 Other chronic pain: Secondary | ICD-10-CM

## 2023-06-09 DIAGNOSIS — M25562 Pain in left knee: Secondary | ICD-10-CM | POA: Diagnosis not present

## 2023-06-09 NOTE — Therapy (Signed)
 OUTPATIENT PHYSICAL THERAPY LOWER EXTREMITY TREATMENT   Patient Name: Carrie [name] MRN: [id number] DOB:12-29-1970, 53 y.o., female Today's Date: 06/09/2023  END OF SESSION:  PT End of Session - 06/09/23 1152     Visit Number 8    Number of Visits 12    Date for PT Re-Evaluation 06/23/23    Authorization Type [employer], no auth required    PT Start Time 1103    PT Stop Time 1145    PT Time Calculation (min) 42 min    Equipment Utilized During Treatment Gait belt    Activity Tolerance Patient tolerated treatment well    Behavior During Therapy WFL for tasks assessed/performed                  Past Medical History:  Diagnosis Date   Arthritis    Arthrofibrosis of right total knee arthroplasty, subsequent encounter 05/20/2021   Diabetes mellitus without complication (HCC)    type 2   Essential hypertension    GERD (gastroesophageal reflux disease)    Headache    migraine   Hypothyroidism    Mixed hyperlipidemia    PONV (postoperative nausea and vomiting)    Really Sick!!! Had to stay in the hospital   Past Surgical History:  Procedure Laterality Date   ABDOMINAL HYSTERECTOMY     BIOPSY  04/25/2022   Procedure: BIOPSY;  Surgeon: Suzette Espy, MD;  Location: [facility];  Service: Endoscopy;;   COLONOSCOPY WITH PROPOFOL  N/A 04/25/2022   Procedure: COLONOSCOPY WITH PROPOFOL ;  Surgeon: Suzette Espy, MD;  Location: [facility];  Service: Endoscopy;  Laterality: N/A;  8:45 am, asa 2   ESOPHAGOGASTRODUODENOSCOPY (EGD) WITH PROPOFOL  N/A 04/25/2022   Procedure: ESOPHAGOGASTRODUODENOSCOPY (EGD) WITH PROPOFOL ;  Surgeon: Suzette Espy, MD;  Location: [facility];  Service: Endoscopy;  Laterality: N/A;   HARDWARE REMOVAL Right 04/02/2021   Procedure: Right Knee HARDWARE REMOVAL;  Surgeon: Arnie Lao, MD;  Location: [facility];  Service: Orthopedics;  Laterality: Right;   KNEE ARTHROSCOPY  02/15/2007   KNEE CLOSED REDUCTION Right 05/20/2021   Procedure:  CLOSED MANIPULATION RIGHT KNEE;  Surgeon: Arnie Lao, MD;  Location: [facility];  Service: Orthopedics;  Laterality: Right;   MALONEY DILATION N/A 04/25/2022   Procedure: Londa Rival DILATION;  Surgeon: Suzette Espy, MD;  Location: [facility];  Service: Endoscopy;  Laterality: N/A;   MASS EXCISION  02/11/2011   Procedure: EXCISION MASS;  Surgeon: Alphonso Jean, MD;  Location: [facility];  Service: Orthopedics;  Laterality: Right;  resection of neuroma right infrapatellar medial knee   PATELLA ARTHROPLASTY     rt knee-   POLYPECTOMY  04/25/2022   Procedure: POLYPECTOMY;  Surgeon: Suzette Espy, MD;  Location: [facility];  Service: Endoscopy;;   REDUCTION MAMMAPLASTY Bilateral 2017   TOTAL KNEE ARTHROPLASTY  02/14/2009   partial-Snyder reg    TOTAL KNEE ARTHROPLASTY Right 04/02/2021   Procedure: Right TOTAL KNEE ARTHROPLASTY;  Surgeon: Arnie Lao, MD;  Location: [facility];  Service: Orthopedics;  Laterality: Right;   TOTAL KNEE ARTHROPLASTY Left 05/05/2023   Procedure: LEFT TOTAL KNEE ARTHROPLASTY;  Surgeon: Arnie Lao, MD;  Location: [facility];  Service: Orthopedics;  Laterality: Left;   TUBAL LIGATION     Patient Active Problem List   Diagnosis Date Noted   Status post total left knee replacement 05/05/2023   Encounter for screening colonoscopy 04/04/2022   Esophageal dysphagia 04/04/2022  Constipation 04/04/2022   Arthrofibrosis of right total knee arthroplasty, subsequent encounter 05/20/2021   Pain in right knee 04/19/2021   Stiffness of right knee, not elsewhere classified 04/19/2021   Difficulty in walking, not elsewhere classified 04/19/2021   Status post total knee replacement, right 04/02/2021   Allergic rhinitis due to pollen 06/22/2020   Eosinophilic esophagitis 06/22/2020   Rash and other nonspecific skin eruption 06/22/2020   AC joint arthropathy 01/04/2017   Acute pain of right shoulder 11/15/2016    Other derangements of patella, unspecified knee 09/02/2015   Recurrent dislocation of right patella 09/02/2015   Breast hypertrophy in female 05/13/2015   Neuroma of lower extremity 02/11/2011    Class: Chronic    PCP: [name], PA-C   REFERRING PROVIDER: Arnie Lao, MD Next apt: 05/18/23  REFERRING DIAG: W11.914 (ICD-10-CM) - Status post total left knee replacement M17.12 (ICD-10-CM) - Unilateral primary osteoarthritis, left knee  THERAPY DIAG:  Stiffness of right knee, not elsewhere classified  Unilateral primary osteoarthritis, right knee  Difficulty in walking, not elsewhere classified  Chronic pain of right knee  Rationale for Evaluation and Treatment: Rehabilitation  ONSET DATE: 05/05/2023  SUBJECTIVE:   SUBJECTIVE STATEMENT: Arrives without AD; overall doing well; "stiff"  Eval:  Pt reports extended stay in hospital following sx due to BP changes and having a temperature. Pt reports being independent with all household and community ambulation with no need for AD but was in significant amount of pain. Pt would like to be able to decrease the pain level and return to being able to "get up and go." Pt reports taking compression sleeve off this morning due to pain.  PERTINENT HISTORY: Surgery was a week ago today, 05/05/23. Pt has hx of R TKA 2 years ago.  PAIN:  Are you having pain? Yes: NPRS scale: 3/10 Pain location: left knee all around Pain description: dull  Aggravating factors: walking, standing Relieving factors: sitting, iceman  PRECAUTIONS: Knee  RED FLAGS: None   WEIGHT BEARING RESTRICTIONS: No  FALLS:  Has patient fallen in last 6 months? No  LIVING ENVIRONMENT: Lives with: lives with their spouse and lives with their daughter Lives in: House/apartment Stairs: Yes: External: 1 steps; none Has following equipment at home: Single point cane and Crutches  OCCUPATION: tax office  PLOF: Independent with household mobility  without device, Independent with community mobility without device, and pain with stairs  PATIENT GOALS: get rid of pain, wants to get her life back, walk dogs, wants to get up and go  NEXT MD VISIT: 05/18/23  OBJECTIVE:  Note: Objective measures were completed at Evaluation unless otherwise noted.  DIAGNOSTIC FINDINGS:  Radiographs 3/21: CLINICAL DATA:  Status post total left knee arthroplasty.   EXAM: PORTABLE LEFT KNEE - 1-2 VIEW   COMPARISON:  Left knee radiographs 01/09/2023   FINDINGS: Interval total left knee arthroplasty. No perihardware lucency is seen to indicate hardware failure or loosening. Expected postoperative changes including intra-articular and subcutaneous air. Smalljoint effusion. Anterior surgical skin staples. No acute fracture or dislocation.   IMPRESSION: Interval total left knee arthroplasty without evidence of hardware failure.  PATIENT SURVEYS:  LEFS 30/80 = 37.5%  COGNITION: Overall cognitive status: Within functional limits for tasks assessed     SENSATION: [facility] Not tested  EDEMA:  Pt demonstrates increased swelling in LLE with pt reporting, she took compression sleeve off this morning due to pain. Pt denies throbbing pain and is educated on the risk of DVT and importance  of inflammation control.  PALPATION: Pain/tenderness/soreness reported during palpation of L knee and mid calf region.   LOWER EXTREMITY ROM:  Active ROM Right eval Left eval Left 05/23/23 Left 06/09/23  Hip flexion      Hip extension      Hip abduction      Hip adduction      Hip internal rotation      Hip external rotation      Knee flexion 109 60 77 98  Knee extension 4 15 -10 (lacking) -2 AAROM  Ankle dorsiflexion      Ankle plantarflexion      Ankle inversion      Ankle eversion       (Blank rows = not tested)  LOWER EXTREMITY MMT: not formally assessed upon eval due to pain  MMT Right eval Left eval  Hip flexion WFL   Hip extension [facility]   Hip  abduction    Hip adduction    Hip internal rotation    Hip external rotation    Knee flexion WFL   Knee extension WFL   Ankle dorsiflexion WFL   Ankle plantarflexion WFL   Ankle inversion    Ankle eversion     (Blank rows = not tested)   FUNCTIONAL TESTS:  5 times sit to stand: TBA 2 minute walk test: 226 ft with RW with slow cadence, decreased stride length and step length bilaterally, antalgic  30 second sit to stand 7 times in split stance with UE assistance from chair GAIT: Distance walked: 100 Assistive device utilized: Environmental consultant - 2 wheeled Level of assistance: Modified independence Comments: decreased speed, decreased stride length and step length bilaterally, antalgic                                                                                                                                TREATMENT DATE:  06/09/23 STM and manual ROM to left knee x 12' AAROM left knee 98 flexion Bike seat 6 x 5' continuous revolutions forward  Heel raises on incline 2 x 10 Slant board 5 x 20" 12" step left knee drives for flexion Squats to chair for target 2 x 10 4" step ups 2 x 10  06/06/2023  Therapeutic Exercise: -Bike seat 6 x 5' rocking, able to do complete turn once -Calf raises, 2 sets of 10 reps -Aeromat lateral stepping and heel toe walking, 3 laps each, 3 lengths of aeromat, pt cued for decreased UE support -3 way hip, 2 set of 5 reps, bilaterally, 3# AW -Resisted walking 3# AW, walking marches/butt kicks, 3 laps, 60 foot laps. -Slant board 3x 30" -Hamstring stretch, 2 sets of 30 second holds -Knee drive stretch on 18 inch step, 10 reps for 10 second holds   Therapeutic Activity: -Step up and overs, 2 sets of 5 reps, 6 inch step -Lateral step up and overs, 2 sets of 5 reps, 6 inch step -TKE on 4 inch  step, 3 sets of 6 reps    06/02/2023  Therapeutic Exercise: -Bike seat 6 x 5' rocking -Calf raises, 2 sets of 10 reps -Aeromat lateral stepping and heel toe  walking, 5 laps each, 2 lengths of aeromat, pt cued for proper form -Forward lunge on bosu ball with bilateral UE support and 10 second holds for flexion and extension, pt cued for pain free ROM, 2 sets of 10 reps -Slant board 3x 30" -Hamstring stretch, 2 sets of 30 second holds -3 way hip, 1 set of 5 reps, bilaterally -Lateral stepping 1 laps 20 feet per lap, pt cued for upright posture  Therapeutic Activity: -Sit to stands, 2 sets of 10 reps, pt cued for core activation -Step up and overs, 2 sets of 5 reps, 4 inch step -Lateral step up and overs, 4 inch step -TKE on 2 inch step, 2 sets of 10 reps    05/26/23: Gait training with cueing for heel strike and toe push off x237ft no AD Bike seat 6 x 5' rocking Standing: Knee drives on 12in step 5x 10" holds for flexion Heel raises on incline slope 15x 5" Toe raises on decline slope 15x 5" TKE with RTB 10x5" Hamstring stretch on 12in step 3x 30" Slant board 3x 30" Prone:  Prone knee hang x 2 min  Manual:  Decongestive massage with LE elevated, STM to quadriceps in supine position with LE elevated.  05/23/23 STM to left knee to decrease pain and improve soft tissue extensibility followed by manual ROM x 15 AAROM left knee -10 to 77 Bike seat 6 x 6' rocking Standing: 8" box left knee drives for flexion x 3' Heel raises 2 x 10 Calf/gastroc stretch 10" x 10 Mini squats x 10    PATIENT EDUCATION:  Education details: Pt was educated on findings of PT evaluation, prognosis, frequency of therapy visits and rationale, attendance policy, and HEP if given.   Person educated: Patient Education method: Explanation Education comprehension: verbalized understanding and needs further education  HOME EXERCISE PROGRAM: Access Code: [email or web address] URL: [email or web address] Date: 05/12/2023 Prepared by: Armond Bertin  Exercises - Supine Ankle Pumps - 3 x daily - 1 sets - 15-20 reps - Supine Hip Abduction - 3 x daily - 1 sets -  15-20 reps - Supine Quad Set - 3 x daily - 1 sets - 15-20 reps - 3 sec hold - Supine Heel Slide with Strap - 3 x daily - 3 sets - 15-20 reps - 3 sec hold   - Prone Knee Extension Hang  - 2 x daily - 7 x weekly - 1 sets - 2 reps - 1'+ hold - Prone Quadriceps Stretch with Strap  - 2 x daily - 7 x weekly - 1 sets - 3 reps - 30" hold - Supine Knee Extension Mobilization with Weight  - 2 x daily - 7 x weekly - 1 sets - 1'+ hold  ASSESSMENT:  CLINICAL IMPRESSION: Walking in PT gym without AD; mild extension lag noted.  A few steri strips remain; no oozing or draining noted.  Good improvement with knee flexion noted today.  Able to make full continuous revolutions on the bike.  Patient would continue to benefit from skilled physical therapy for decreased knee pain, increased L knee mobility, increased LLE strength, and improved balance for improved quality of life, improved gait quality and continued progress towards therapy goals.    Eval:Patient is a 53 y.o. female who was seen today for physical therapy evaluation and treatment  for status post Left TKA, 05/05/2023. Pt demonstrates limited left knee ROM and strength, limited by pain. Pt demonstrates difficulty with ambulation and requires RW. Pt demonstrates decreased gait speed and efficiency due to pain and RW height, RW was adjusted and pt was educated on DME. Patient would continue to benefit from skilled physical therapy to increase pain free L knee ROM, increased LLE strength for improved quality of life, community ambulation and continued progress towards therapy goals.   OBJECTIVE IMPAIRMENTS: Abnormal gait, decreased activity tolerance, decreased balance, decreased coordination, decreased knowledge of use of DME, decreased mobility, difficulty walking, decreased ROM, decreased strength, increased edema, and pain.   ACTIVITY LIMITATIONS: carrying, bending, standing, squatting, sleeping, stairs, and bed mobility  PARTICIPATION LIMITATIONS:  meal prep, cleaning, laundry, shopping, community activity, occupation, and yard work  PERSONAL FACTORS: Age, Past/current experiences, and 1 comorbidity: had to stay a bit longer in the hospital following sx due to temperature and BP changes  are also affecting patient's functional outcome.   REHAB POTENTIAL: Good  CLINICAL DECISION MAKING: Stable/uncomplicated  EVALUATION COMPLEXITY: Low   GOALS: Goals reviewed with patient? No  SHORT TERM GOALS: Target date: 06/02/23  Patient will demonstrate evidence of independence with individualized HEP and will report compliance for at least 3 days per week for optimized progression towards remaining therapy goals. Baseline: following post op exercises Goal status: INITIAL  2.  Patient will report a decrease in pain level during community ambulation by at least 2 points for improved quality of life. Baseline: 4/10 Goal status: INITIAL     LONG TERM GOALS: Target date: 06/23/23  Pt will demonstrate a an increase of at least 9 points on the LEFS for improved performance of community ambulation and ADL. Baseline: TBA next session Goal status: INITIAL  2.  Pt will improve 2 MWT to at least 578 feet in order to demonstrate age norms for functional ambulatory capacity in community setting.  Baseline: TBA as pt tolerates post op soreness Goal status: INITIAL  3.  Pt will demonstrate WFL ROM (flexion and extension) in left knee, for increased mobility and maximal efficiency of gait cycle during ambulation. Baseline: see objective Goal status: INITIAL  4.  Pt will demonstrate at least 4-/5 MMT for left lower extremity for increased strength during ADL and community ambulation. Baseline: TBA when pain reduces Goal status: INITIAL     PLAN:  PT FREQUENCY: 2x/week  PT DURATION: 6 weeks  PLANNED INTERVENTIONS: 97110-Therapeutic exercises, 97530- Therapeutic activity, [id number]- Neuromuscular re-education, 97535- Self Care, and [id number]- Manual  therapy  PLAN FOR NEXT SESSION: Knee mobilty and progress LLE strengthening.  Progress step ups and dynamic balance activities to pt toleration. Add TKE's  11:52 AM, 06/09/23 Orlandis Sanden Small Jasenia Weilbacher MPT [facility] [place] [phone]

## 2023-06-13 ENCOUNTER — Ambulatory Visit (HOSPITAL_COMMUNITY): Admitting: Physical Therapy

## 2023-06-13 DIAGNOSIS — G8929 Other chronic pain: Secondary | ICD-10-CM

## 2023-06-13 DIAGNOSIS — M25562 Pain in left knee: Secondary | ICD-10-CM | POA: Diagnosis not present

## 2023-06-13 DIAGNOSIS — M25561 Pain in right knee: Secondary | ICD-10-CM | POA: Diagnosis not present

## 2023-06-13 DIAGNOSIS — Z96652 Presence of left artificial knee joint: Secondary | ICD-10-CM | POA: Diagnosis not present

## 2023-06-13 DIAGNOSIS — M25661 Stiffness of right knee, not elsewhere classified: Secondary | ICD-10-CM

## 2023-06-13 DIAGNOSIS — R262 Difficulty in walking, not elsewhere classified: Secondary | ICD-10-CM | POA: Diagnosis not present

## 2023-06-13 DIAGNOSIS — M1711 Unilateral primary osteoarthritis, right knee: Secondary | ICD-10-CM

## 2023-06-13 DIAGNOSIS — G8918 Other acute postprocedural pain: Secondary | ICD-10-CM | POA: Diagnosis not present

## 2023-06-13 NOTE — Therapy (Signed)
 OUTPATIENT PHYSICAL THERAPY LOWER EXTREMITY TREATMENT   Patient Name: Carrie [name] MRN: [id number] DOB:1970/12/03, 53 y.o., female Today's Date: 06/13/2023  END OF SESSION:  PT End of Session - 06/13/23 1324     Visit Number 9    Number of Visits 12    Date for PT Re-Evaluation 06/23/23    Authorization Type [employer], no auth required    PT Start Time 1144    PT Stop Time 1226    PT Time Calculation (min) 42 min    Equipment Utilized During Treatment Gait belt    Activity Tolerance Patient tolerated treatment well    Behavior During Therapy WFL for tasks assessed/performed                   Past Medical History:  Diagnosis Date   Arthritis    Arthrofibrosis of right total knee arthroplasty, subsequent encounter 05/20/2021   Diabetes mellitus without complication (HCC)    type 2   Essential hypertension    GERD (gastroesophageal reflux disease)    Headache    migraine   Hypothyroidism    Mixed hyperlipidemia    PONV (postoperative nausea and vomiting)    Really Sick!!! Had to stay in the hospital   Past Surgical History:  Procedure Laterality Date   ABDOMINAL HYSTERECTOMY     BIOPSY  04/25/2022   Procedure: BIOPSY;  Surgeon: Suzette Espy, MD;  Location: [facility];  Service: Endoscopy;;   COLONOSCOPY WITH PROPOFOL  N/A 04/25/2022   Procedure: COLONOSCOPY WITH PROPOFOL ;  Surgeon: Suzette Espy, MD;  Location: [facility];  Service: Endoscopy;  Laterality: N/A;  8:45 am, asa 2   ESOPHAGOGASTRODUODENOSCOPY (EGD) WITH PROPOFOL  N/A 04/25/2022   Procedure: ESOPHAGOGASTRODUODENOSCOPY (EGD) WITH PROPOFOL ;  Surgeon: Suzette Espy, MD;  Location: [facility];  Service: Endoscopy;  Laterality: N/A;   HARDWARE REMOVAL Right 04/02/2021   Procedure: Right Knee HARDWARE REMOVAL;  Surgeon: Arnie Lao, MD;  Location: [facility];  Service: Orthopedics;  Laterality: Right;   KNEE ARTHROSCOPY  02/15/2007   KNEE CLOSED REDUCTION Right 05/20/2021    Procedure: CLOSED MANIPULATION RIGHT KNEE;  Surgeon: Arnie Lao, MD;  Location: [facility];  Service: Orthopedics;  Laterality: Right;   MALONEY DILATION N/A 04/25/2022   Procedure: Londa Rival DILATION;  Surgeon: Suzette Espy, MD;  Location: [facility];  Service: Endoscopy;  Laterality: N/A;   MASS EXCISION  02/11/2011   Procedure: EXCISION MASS;  Surgeon: Alphonso Jean, MD;  Location: [facility];  Service: Orthopedics;  Laterality: Right;  resection of neuroma right infrapatellar medial knee   PATELLA ARTHROPLASTY     rt knee-   POLYPECTOMY  04/25/2022   Procedure: POLYPECTOMY;  Surgeon: Suzette Espy, MD;  Location: [facility];  Service: Endoscopy;;   REDUCTION MAMMAPLASTY Bilateral 2017   TOTAL KNEE ARTHROPLASTY  02/14/2009   partial-Jeddo reg    TOTAL KNEE ARTHROPLASTY Right 04/02/2021   Procedure: Right TOTAL KNEE ARTHROPLASTY;  Surgeon: Arnie Lao, MD;  Location: [facility];  Service: Orthopedics;  Laterality: Right;   TOTAL KNEE ARTHROPLASTY Left 05/05/2023   Procedure: LEFT TOTAL KNEE ARTHROPLASTY;  Surgeon: Arnie Lao, MD;  Location: [facility];  Service: Orthopedics;  Laterality: Left;   TUBAL LIGATION     Patient Active Problem List   Diagnosis Date Noted   Status post total left knee replacement 05/05/2023   Encounter for screening colonoscopy 04/04/2022   Esophageal dysphagia 04/04/2022  Constipation 04/04/2022   Arthrofibrosis of right total knee arthroplasty, subsequent encounter 05/20/2021   Pain in right knee 04/19/2021   Stiffness of right knee, not elsewhere classified 04/19/2021   Difficulty in walking, not elsewhere classified 04/19/2021   Status post total knee replacement, right 04/02/2021   Allergic rhinitis due to pollen 06/22/2020   Eosinophilic esophagitis 06/22/2020   Rash and other nonspecific skin eruption 06/22/2020   AC joint arthropathy 01/04/2017   Acute pain of right shoulder  11/15/2016   Other derangements of patella, unspecified knee 09/02/2015   Recurrent dislocation of right patella 09/02/2015   Breast hypertrophy in female 05/13/2015   Neuroma of lower extremity 02/11/2011    Class: Chronic    PCP: [name], PA-C   REFERRING PROVIDER: Arnie Lao, MD Next apt: 05/18/23  REFERRING DIAG: W29.562 (ICD-10-CM) - Status post total left knee replacement M17.12 (ICD-10-CM) - Unilateral primary osteoarthritis, left knee  THERAPY DIAG:  Stiffness of right knee, not elsewhere classified  Unilateral primary osteoarthritis, right knee  Difficulty in walking, not elsewhere classified  Chronic pain of right knee  Rationale for Evaluation and Treatment: Rehabilitation  ONSET DATE: 05/05/2023  SUBJECTIVE:   SUBJECTIVE STATEMENT: Pt reports doing well overall.  No pain currently.  Continues to walk without AD  Eval:  Pt reports extended stay in hospital following sx due to BP changes and having a temperature. Pt reports being independent with all household and community ambulation with no need for AD but was in significant amount of pain. Pt would like to be able to decrease the pain level and return to being able to "get up and go." Pt reports taking compression sleeve off this morning due to pain.  PERTINENT HISTORY: Surgery was a week ago today, 05/05/23. Pt has hx of R TKA 2 years ago.  PAIN:  Are you having pain? Yes: NPRS scale: 3/10 Pain location: left knee all around Pain description: dull  Aggravating factors: walking, standing Relieving factors: sitting, iceman  PRECAUTIONS: Knee  RED FLAGS: None   WEIGHT BEARING RESTRICTIONS: No  FALLS:  Has patient fallen in last 6 months? No  LIVING ENVIRONMENT: Lives with: lives with their spouse and lives with their daughter Lives in: House/apartment Stairs: Yes: External: 1 steps; none Has following equipment at home: Single point cane and Crutches  OCCUPATION: tax  office  PLOF: Independent with household mobility without device, Independent with community mobility without device, and pain with stairs  PATIENT GOALS: get rid of pain, wants to get her life back, walk dogs, wants to get up and go  NEXT MD VISIT: 05/18/23  OBJECTIVE:  Note: Objective measures were completed at Evaluation unless otherwise noted.  DIAGNOSTIC FINDINGS:  Radiographs 3/21: CLINICAL DATA:  Status post total left knee arthroplasty.   EXAM: PORTABLE LEFT KNEE - 1-2 VIEW   COMPARISON:  Left knee radiographs 01/09/2023   FINDINGS: Interval total left knee arthroplasty. No perihardware lucency is seen to indicate hardware failure or loosening. Expected postoperative changes including intra-articular and subcutaneous air. Smalljoint effusion. Anterior surgical skin staples. No acute fracture or dislocation.   IMPRESSION: Interval total left knee arthroplasty without evidence of hardware failure.  PATIENT SURVEYS:  LEFS 30/80 = 37.5%  COGNITION: Overall cognitive status: Within functional limits for tasks assessed     SENSATION: [facility] Not tested  EDEMA:  Pt demonstrates increased swelling in LLE with pt reporting, she took compression sleeve off this morning due to pain. Pt denies throbbing pain and is  educated on the risk of DVT and importance of inflammation control.  PALPATION: Pain/tenderness/soreness reported during palpation of L knee and mid calf region.   LOWER EXTREMITY ROM:  Active ROM Right eval Left eval Left 05/23/23 Left 06/09/23 Left 06/13/23  Hip flexion       Hip extension       Hip abduction       Hip adduction       Hip internal rotation       Hip external rotation       Knee flexion 109 60 77 98   Knee extension 4 15 -10 (lacking) -2 AAROM   Ankle dorsiflexion       Ankle plantarflexion       Ankle inversion       Ankle eversion        (Blank rows = not tested)  LOWER EXTREMITY MMT: not formally assessed upon eval due to  pain  MMT Right eval Left eval  Hip flexion WFL   Hip extension [facility]   Hip abduction    Hip adduction    Hip internal rotation    Hip external rotation    Knee flexion WFL   Knee extension WFL   Ankle dorsiflexion WFL   Ankle plantarflexion WFL   Ankle inversion    Ankle eversion     (Blank rows = not tested)   FUNCTIONAL TESTS:  5 times sit to stand: TBA 2 minute walk test: 226 ft with RW with slow cadence, decreased stride length and step length bilaterally, antalgic  30 second sit to stand 7 times in split stance with UE assistance from chair GAIT: Distance walked: 100 Assistive device utilized: Environmental consultant - 2 wheeled Level of assistance: Modified independence Comments: decreased speed, decreased stride length and step length bilaterally, antalgic                                                                                                                                TREATMENT DATE:  06/13/23 Bike seat 6 x 5' full revoluations Standing: Knee drives on 12in step 10x 10" holds for flexion Hamstring stretch 12" step 5X20" with slight OP superior knee Slant board 5X20" Knee flexion end ROM toe on 4" step 20X Heel raises on incline slope 20X Toe raises on decline slope 20X [employer] TKE Supine:  scar massage/myofascial techniques  Prone contract relax for flexion 3X  A/PROM for Lt knee 98 degrees flexion max   06/09/23 STM and manual ROM to left knee x 12' AAROM left knee 98 flexion Bike seat 6 x 5' continuous revolutions forward  Heel raises on incline 2 x 10 Slant board 5 x 20" 12" step left knee drives for flexion Squats to chair for target 2 x 10 4" step ups 2 x 10  06/06/2023  Therapeutic Exercise: -Bike seat 6 x 5' rocking, able to do complete turn once -Calf raises, 2 sets of 10 reps -Aeromat  lateral stepping and heel toe walking, 3 laps each, 3 lengths of aeromat, pt cued for decreased UE support -3 way hip, 2 set of 5 reps, bilaterally, 3#  AW -Resisted walking 3# AW, walking marches/butt kicks, 3 laps, 60 foot laps. -Slant board 3x 30" -Hamstring stretch, 2 sets of 30 second holds -Knee drive stretch on 18 inch step, 10 reps for 10 second holds  Therapeutic Activity: -Step up and overs, 2 sets of 5 reps, 6 inch step -Lateral step up and overs, 2 sets of 5 reps, 6 inch step -TKE on 4 inch step, 3 sets of 6 reps     PATIENT EDUCATION:  Education details: Pt was educated on findings of PT evaluation, prognosis, frequency of therapy visits and rationale, attendance policy, and HEP if given.   Person educated: Patient Education method: Explanation Education comprehension: verbalized understanding and needs further education  HOME EXERCISE PROGRAM: Access Code: [email or web address] URL: [email or web address] Date: 05/12/2023 Prepared by: Armond Bertin  Exercises - Supine Ankle Pumps - 3 x daily - 1 sets - 15-20 reps - Supine Hip Abduction - 3 x daily - 1 sets - 15-20 reps - Supine Quad Set - 3 x daily - 1 sets - 15-20 reps - 3 sec hold - Supine Heel Slide with Strap - 3 x daily - 3 sets - 15-20 reps - 3 sec hold   - Prone Knee Extension Hang  - 2 x daily - 7 x weekly - 1 sets - 2 reps - 1'+ hold - Prone Quadriceps Stretch with Strap  - 2 x daily - 7 x weekly - 1 sets - 3 reps - 30" hold - Supine Knee Extension Mobilization with Weight  - 2 x daily - 7 x weekly - 1 sets - 1'+ hold  ASSESSMENT:  CLINICAL IMPRESSION: Focused today on Lt knee AROM and functional strengthening. Flexion continues to be most limited.  Added end ROM flexion using step and began terminal knee extension using pulley weights.  Manual completed including scar and myofascial techniques to help reduce scar tissue.  Contract relax began in prone with good tolerance.  Flexion remains at 98 degrees max.  Encouraged to push this at home. PT remains appropriate for improving ROM and functional mobility.    Eval:Patient is a 53 y.o. female who was seen  today for physical therapy evaluation and treatment for status post Left TKA, 05/05/2023. Pt demonstrates limited left knee ROM and strength, limited by pain. Pt demonstrates difficulty with ambulation and requires RW. Pt demonstrates decreased gait speed and efficiency due to pain and RW height, RW was adjusted and pt was educated on DME. Patient would continue to benefit from skilled physical therapy to increase pain free L knee ROM, increased LLE strength for improved quality of life, community ambulation and continued progress towards therapy goals.   OBJECTIVE IMPAIRMENTS: Abnormal gait, decreased activity tolerance, decreased balance, decreased coordination, decreased knowledge of use of DME, decreased mobility, difficulty walking, decreased ROM, decreased strength, increased edema, and pain.   ACTIVITY LIMITATIONS: carrying, bending, standing, squatting, sleeping, stairs, and bed mobility  PARTICIPATION LIMITATIONS: meal prep, cleaning, laundry, shopping, community activity, occupation, and yard work  PERSONAL FACTORS: Age, Past/current experiences, and 1 comorbidity: had to stay a bit longer in the hospital following sx due to temperature and BP changes  are also affecting patient's functional outcome.   REHAB POTENTIAL: Good  CLINICAL DECISION MAKING: Stable/uncomplicated  EVALUATION COMPLEXITY: Low   GOALS: Goals reviewed with patient? No  SHORT  TERM GOALS: Target date: 06/02/23  Patient will demonstrate evidence of independence with individualized HEP and will report compliance for at least 3 days per week for optimized progression towards remaining therapy goals. Baseline: following post op exercises Goal status: INITIAL  2.  Patient will report a decrease in pain level during community ambulation by at least 2 points for improved quality of life. Baseline: 4/10 Goal status: INITIAL     LONG TERM GOALS: Target date: 06/23/23  Pt will demonstrate a an increase of at least 9  points on the LEFS for improved performance of community ambulation and ADL. Baseline: TBA next session Goal status: INITIAL  2.  Pt will improve 2 MWT to at least 578 feet in order to demonstrate age norms for functional ambulatory capacity in community setting.  Baseline: TBA as pt tolerates post op soreness Goal status: INITIAL  3.  Pt will demonstrate WFL ROM (flexion and extension) in left knee, for increased mobility and maximal efficiency of gait cycle during ambulation. Baseline: see objective Goal status: INITIAL  4.  Pt will demonstrate at least 4-/5 MMT for left lower extremity for increased strength during ADL and community ambulation. Baseline: TBA when pain reduces Goal status: INITIAL     PLAN:  PT FREQUENCY: 2x/week  PT DURATION: 6 weeks  PLANNED INTERVENTIONS: 97110-Therapeutic exercises, 97530- Therapeutic activity, [id number]- Neuromuscular re-education, 97535- Self Care, and [id number]- Manual therapy  PLAN FOR NEXT SESSION: Knee mobilty and progress LLE strengthening.  Continue with focus on improving LT knee flexion  1:25 PM, 06/13/23 Lorenso Romance, PTA/CLT [facility] Ph: [phone]

## 2023-06-15 ENCOUNTER — Encounter: Payer: Self-pay | Admitting: Orthopaedic Surgery

## 2023-06-15 ENCOUNTER — Ambulatory Visit (INDEPENDENT_AMBULATORY_CARE_PROVIDER_SITE_OTHER): Admitting: Orthopaedic Surgery

## 2023-06-15 ENCOUNTER — Other Ambulatory Visit: Payer: Self-pay | Admitting: Radiology

## 2023-06-15 DIAGNOSIS — Z96652 Presence of left artificial knee joint: Secondary | ICD-10-CM

## 2023-06-15 NOTE — Progress Notes (Signed)
 The patient is a 53 year old female who is now around 6 weeks status post a left total knee replacement.  She has been diligently going to physical therapy and participating in therapy.  She said they have been able to flex her to about 98 degrees.  On my exam she has almost full extension and we flex her to about 95 degrees.  The incision looks good.  There was a small retained suture that I removed.  Her calf is soft.  She would definitely benefit from physical therapy for 4 more weeks.  She is at the point of flexing her knee but I think it is worthwhile trying a steroid injection in her knee today to get help decrease some of the inflammation and even help her flex her knee more.  She has also been dealing with left-sided sciatica since surgery.  She says is really in the posterior pelvis and she points the SI joint area as a source of her pain.  Hopefully with time that will improve as she is increasing her mobility.  I did place a steroid injection in her left knee which she tolerated well.  We will see her in a month to see how she is doing from a range of motion standpoint.  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

## 2023-06-16 ENCOUNTER — Ambulatory Visit (HOSPITAL_COMMUNITY): Attending: Orthopaedic Surgery

## 2023-06-16 DIAGNOSIS — R262 Difficulty in walking, not elsewhere classified: Secondary | ICD-10-CM | POA: Insufficient documentation

## 2023-06-16 DIAGNOSIS — Z96652 Presence of left artificial knee joint: Secondary | ICD-10-CM | POA: Insufficient documentation

## 2023-06-16 DIAGNOSIS — Z96651 Presence of right artificial knee joint: Secondary | ICD-10-CM | POA: Insufficient documentation

## 2023-06-16 DIAGNOSIS — M25562 Pain in left knee: Secondary | ICD-10-CM | POA: Diagnosis not present

## 2023-06-16 DIAGNOSIS — G8918 Other acute postprocedural pain: Secondary | ICD-10-CM | POA: Diagnosis not present

## 2023-06-16 NOTE — Therapy (Signed)
 OUTPATIENT PHYSICAL THERAPY LOWER EXTREMITY TREATMENT   Patient Name: Carrie [name] MRN: [id number] DOB:1970-12-27, 53 y.o., female Today's Date: 06/16/2023  END OF SESSION:  PT End of Session - 06/16/23 1148     Visit Number 10    Number of Visits 12    Date for PT Re-Evaluation 06/23/23    Authorization Type [employer], no auth required    PT Start Time 1146    PT Stop Time 1226    PT Time Calculation (min) 40 min    Equipment Utilized During Treatment Gait belt    Activity Tolerance Patient tolerated treatment well    Behavior During Therapy WFL for tasks assessed/performed                   Past Medical History:  Diagnosis Date   Arthritis    Arthrofibrosis of right total knee arthroplasty, subsequent encounter 05/20/2021   Diabetes mellitus without complication (HCC)    type 2   Essential hypertension    GERD (gastroesophageal reflux disease)    Headache    migraine   Hypothyroidism    Mixed hyperlipidemia    PONV (postoperative nausea and vomiting)    Really Sick!!! Had to stay in the hospital   Past Surgical History:  Procedure Laterality Date   ABDOMINAL HYSTERECTOMY     BIOPSY  04/25/2022   Procedure: BIOPSY;  Surgeon: Suzette Espy, MD;  Location: [facility];  Service: Endoscopy;;   COLONOSCOPY WITH PROPOFOL  N/A 04/25/2022   Procedure: COLONOSCOPY WITH PROPOFOL ;  Surgeon: Suzette Espy, MD;  Location: [facility];  Service: Endoscopy;  Laterality: N/A;  8:45 am, asa 2   ESOPHAGOGASTRODUODENOSCOPY (EGD) WITH PROPOFOL  N/A 04/25/2022   Procedure: ESOPHAGOGASTRODUODENOSCOPY (EGD) WITH PROPOFOL ;  Surgeon: Suzette Espy, MD;  Location: [facility];  Service: Endoscopy;  Laterality: N/A;   HARDWARE REMOVAL Right 04/02/2021   Procedure: Right Knee HARDWARE REMOVAL;  Surgeon: Arnie Lao, MD;  Location: [facility];  Service: Orthopedics;  Laterality: Right;   KNEE ARTHROSCOPY  02/15/2007   KNEE CLOSED REDUCTION Right 05/20/2021    Procedure: CLOSED MANIPULATION RIGHT KNEE;  Surgeon: Arnie Lao, MD;  Location: [facility];  Service: Orthopedics;  Laterality: Right;   MALONEY DILATION N/A 04/25/2022   Procedure: Londa Rival DILATION;  Surgeon: Suzette Espy, MD;  Location: [facility];  Service: Endoscopy;  Laterality: N/A;   MASS EXCISION  02/11/2011   Procedure: EXCISION MASS;  Surgeon: Alphonso Jean, MD;  Location: [facility];  Service: Orthopedics;  Laterality: Right;  resection of neuroma right infrapatellar medial knee   PATELLA ARTHROPLASTY     rt knee-   POLYPECTOMY  04/25/2022   Procedure: POLYPECTOMY;  Surgeon: Suzette Espy, MD;  Location: [facility];  Service: Endoscopy;;   REDUCTION MAMMAPLASTY Bilateral 2017   TOTAL KNEE ARTHROPLASTY  02/14/2009   partial-[facility] reg    TOTAL KNEE ARTHROPLASTY Right 04/02/2021   Procedure: Right TOTAL KNEE ARTHROPLASTY;  Surgeon: Arnie Lao, MD;  Location: [facility];  Service: Orthopedics;  Laterality: Right;   TOTAL KNEE ARTHROPLASTY Left 05/05/2023   Procedure: LEFT TOTAL KNEE ARTHROPLASTY;  Surgeon: Arnie Lao, MD;  Location: [facility];  Service: Orthopedics;  Laterality: Left;   TUBAL LIGATION     Patient Active Problem List   Diagnosis Date Noted   Status post total left knee replacement 05/05/2023   Encounter for screening colonoscopy 04/04/2022   Esophageal dysphagia 04/04/2022  Constipation 04/04/2022   Arthrofibrosis of right total knee arthroplasty, subsequent encounter 05/20/2021   Pain in right knee 04/19/2021   Stiffness of right knee, not elsewhere classified 04/19/2021   Difficulty in walking, not elsewhere classified 04/19/2021   Status post total knee replacement, right 04/02/2021   Allergic rhinitis due to pollen 06/22/2020   Eosinophilic esophagitis 06/22/2020   Rash and other nonspecific skin eruption 06/22/2020   AC joint arthropathy 01/04/2017   Acute pain of right shoulder  11/15/2016   Other derangements of patella, unspecified knee 09/02/2015   Recurrent dislocation of right patella 09/02/2015   Breast hypertrophy in female 05/13/2015   Neuroma of lower extremity 02/11/2011    Class: Chronic    PCP: [name], PA-C   REFERRING PROVIDER: Arnie Lao, MD Next apt: 05/18/23  REFERRING DIAG: Z61.096 (ICD-10-CM) - Status post total left knee replacement M17.12 (ICD-10-CM) - Unilateral primary osteoarthritis, left knee  THERAPY DIAG:  Difficulty in walking, not elsewhere classified  Status post total knee replacement, right  Acute postoperative pain of left knee  Rationale for Evaluation and Treatment: Rehabilitation  ONSET DATE: 05/05/2023  SUBJECTIVE:   SUBJECTIVE STATEMENT: Dr. Lucienne Ryder extending her therapy; he did give her an injection yesterday. Also pulled a suture out that did not dissolve.   She is stiff and sore this morning  Eval:  Pt reports extended stay in hospital following sx due to BP changes and having a temperature. Pt reports being independent with all household and community ambulation with no need for AD but was in significant amount of pain. Pt would like to be able to decrease the pain level and return to being able to "get up and go." Pt reports taking compression sleeve off this morning due to pain.  PERTINENT HISTORY: Surgery was a week ago today, 05/05/23. Pt has hx of R TKA 2 years ago.  PAIN:  Are you having pain? Yes: NPRS scale: 3/10 Pain location: left knee all around Pain description: dull  Aggravating factors: walking, standing Relieving factors: sitting, iceman  PRECAUTIONS: Knee  RED FLAGS: None   WEIGHT BEARING RESTRICTIONS: No  FALLS:  Has patient fallen in last 6 months? No  LIVING ENVIRONMENT: Lives with: lives with their spouse and lives with their daughter Lives in: House/apartment Stairs: Yes: External: 1 steps; none Has following equipment at home: Single point cane and  Crutches  OCCUPATION: tax office  PLOF: Independent with household mobility without device, Independent with community mobility without device, and pain with stairs  PATIENT GOALS: get rid of pain, wants to get her life back, walk dogs, wants to get up and go  NEXT MD VISIT: 05/18/23  OBJECTIVE:  Note: Objective measures were completed at Evaluation unless otherwise noted.  DIAGNOSTIC FINDINGS:  Radiographs 3/21: CLINICAL DATA:  Status post total left knee arthroplasty.   EXAM: PORTABLE LEFT KNEE - 1-2 VIEW   COMPARISON:  Left knee radiographs 01/09/2023   FINDINGS: Interval total left knee arthroplasty. No perihardware lucency is seen to indicate hardware failure or loosening. Expected postoperative changes including intra-articular and subcutaneous air. Smalljoint effusion. Anterior surgical skin staples. No acute fracture or dislocation.   IMPRESSION: Interval total left knee arthroplasty without evidence of hardware failure.  PATIENT SURVEYS:  LEFS 30/80 = 37.5%  COGNITION: Overall cognitive status: Within functional limits for tasks assessed     SENSATION: [facility] Not tested  EDEMA:  Pt demonstrates increased swelling in LLE with pt reporting, she took compression sleeve off this morning  due to pain. Pt denies throbbing pain and is educated on the risk of DVT and importance of inflammation control.  PALPATION: Pain/tenderness/soreness reported during palpation of L knee and mid calf region.   LOWER EXTREMITY ROM:  Active ROM Right eval Left eval Left 05/23/23 Left 06/09/23 Left 06/13/23  Hip flexion       Hip extension       Hip abduction       Hip adduction       Hip internal rotation       Hip external rotation       Knee flexion 109 60 77 98   Knee extension 4 15 -10 (lacking) -2 AAROM   Ankle dorsiflexion       Ankle plantarflexion       Ankle inversion       Ankle eversion        (Blank rows = not tested)  LOWER EXTREMITY MMT: not formally  assessed upon eval due to pain  MMT Right eval Left eval  Hip flexion WFL   Hip extension [facility]   Hip abduction    Hip adduction    Hip internal rotation    Hip external rotation    Knee flexion WFL   Knee extension WFL   Ankle dorsiflexion WFL   Ankle plantarflexion WFL   Ankle inversion    Ankle eversion     (Blank rows = not tested)   FUNCTIONAL TESTS:  5 times sit to stand: TBA 2 minute walk test: 226 ft with RW with slow cadence, decreased stride length and step length bilaterally, antalgic  30 second sit to stand 7 times in split stance with UE assistance from chair GAIT: Distance walked: 100 Assistive device utilized: Environmental consultant - 2 wheeled Level of assistance: Modified independence Comments: decreased speed, decreased stride length and step length bilaterally, antalgic                                                                                                                                TREATMENT DATE:  06/16/23 Supine STM to left knee followed by manual ROM for flexion and extension to improve left knee joint mobility x 22' Sitting manual ROM for left knee flexion x 10 reps Bike seat 6 x half revolutions today x 12' for mobility  06/13/23 Bike seat 6 x 5' full revoluations Standing: Knee drives on 12in step 10x 10" holds for flexion Hamstring stretch 12" step 5X20" with slight OP superior knee Slant board 5X20" Knee flexion end ROM toe on 4" step 20X Heel raises on incline slope 20X Toe raises on decline slope 20X [employer] TKE Supine:  scar massage/myofascial techniques  Prone contract relax for flexion 3X  A/PROM for Lt knee 98 degrees flexion max   06/09/23 STM and manual ROM to left knee x 12' AAROM left knee 98 flexion Bike seat 6 x 5' continuous revolutions forward  Heel raises on incline 2  x 10 Slant board 5 x 20" 12" step left knee drives for flexion Squats to chair for target 2 x 10 4" step ups 2 x 10  06/06/2023  Therapeutic  Exercise: -Bike seat 6 x 5' rocking, able to do complete turn once -Calf raises, 2 sets of 10 reps -Aeromat lateral stepping and heel toe walking, 3 laps each, 3 lengths of aeromat, pt cued for decreased UE support -3 way hip, 2 set of 5 reps, bilaterally, 3# AW -Resisted walking 3# AW, walking marches/butt kicks, 3 laps, 60 foot laps. -Slant board 3x 30" -Hamstring stretch, 2 sets of 30 second holds -Knee drive stretch on 18 inch step, 10 reps for 10 second holds  Therapeutic Activity: -Step up and overs, 2 sets of 5 reps, 6 inch step -Lateral step up and overs, 2 sets of 5 reps, 6 inch step -TKE on 4 inch step, 3 sets of 6 reps     PATIENT EDUCATION:  Education details: Pt was educated on findings of PT evaluation, prognosis, frequency of therapy visits and rationale, attendance policy, and HEP if given.   Person educated: Patient Education method: Explanation Education comprehension: verbalized understanding and needs further education  HOME EXERCISE PROGRAM: Access Code: [email or web address] URL: [email or web address] Date: 05/12/2023 Prepared by: Armond Bertin  Exercises - Supine Ankle Pumps - 3 x daily - 1 sets - 15-20 reps - Supine Hip Abduction - 3 x daily - 1 sets - 15-20 reps - Supine Quad Set - 3 x daily - 1 sets - 15-20 reps - 3 sec hold - Supine Heel Slide with Strap - 3 x daily - 3 sets - 15-20 reps - 3 sec hold   - Prone Knee Extension Hang  - 2 x daily - 7 x weekly - 1 sets - 2 reps - 1'+ hold - Prone Quadriceps Stretch with Strap  - 2 x daily - 7 x weekly - 1 sets - 3 reps - 30" hold - Supine Knee Extension Mobilization with Weight  - 2 x daily - 7 x weekly - 1 sets - 1'+ hold  ASSESSMENT:  CLINICAL IMPRESSION: Patient with injection yesterday so focused on mobility yesterday; limited there ex.  She is more stiff today likely due to shot.  Noted IT band is tight left leg.  Small scab remains distal incision.   PT remains appropriate for improving ROM and  functional mobility.    Eval:Patient is a 53 y.o. female who was seen today for physical therapy evaluation and treatment for status post Left TKA, 05/05/2023. Pt demonstrates limited left knee ROM and strength, limited by pain. Pt demonstrates difficulty with ambulation and requires RW. Pt demonstrates decreased gait speed and efficiency due to pain and RW height, RW was adjusted and pt was educated on DME. Patient would continue to benefit from skilled physical therapy to increase pain free L knee ROM, increased LLE strength for improved quality of life, community ambulation and continued progress towards therapy goals.   OBJECTIVE IMPAIRMENTS: Abnormal gait, decreased activity tolerance, decreased balance, decreased coordination, decreased knowledge of use of DME, decreased mobility, difficulty walking, decreased ROM, decreased strength, increased edema, and pain.   ACTIVITY LIMITATIONS: carrying, bending, standing, squatting, sleeping, stairs, and bed mobility  PARTICIPATION LIMITATIONS: meal prep, cleaning, laundry, shopping, community activity, occupation, and yard work  PERSONAL FACTORS: Age, Past/current experiences, and 1 comorbidity: had to stay a bit longer in the hospital following sx due to temperature and BP changes  are also affecting patient's functional  outcome.   REHAB POTENTIAL: Good  CLINICAL DECISION MAKING: Stable/uncomplicated  EVALUATION COMPLEXITY: Low   GOALS: Goals reviewed with patient? No  SHORT TERM GOALS: Target date: 06/02/23  Patient will demonstrate evidence of independence with individualized HEP and will report compliance for at least 3 days per week for optimized progression towards remaining therapy goals. Baseline: following post op exercises Goal status: INITIAL  2.  Patient will report a decrease in pain level during community ambulation by at least 2 points for improved quality of life. Baseline: 4/10 Goal status: INITIAL     LONG TERM  GOALS: Target date: 06/23/23  Pt will demonstrate a an increase of at least 9 points on the LEFS for improved performance of community ambulation and ADL. Baseline: TBA next session Goal status: INITIAL  2.  Pt will improve 2 MWT to at least 578 feet in order to demonstrate age norms for functional ambulatory capacity in community setting.  Baseline: TBA as pt tolerates post op soreness Goal status: INITIAL  3.  Pt will demonstrate WFL ROM (flexion and extension) in left knee, for increased mobility and maximal efficiency of gait cycle during ambulation. Baseline: see objective Goal status: INITIAL  4.  Pt will demonstrate at least 4-/5 MMT for left lower extremity for increased strength during ADL and community ambulation. Baseline: TBA when pain reduces Goal status: INITIAL     PLAN:  PT FREQUENCY: 2x/week  PT DURATION: 6 weeks  PLANNED INTERVENTIONS: [id number]-Therapeutic exercises, 97530- Therapeutic activity, [id number]- Neuromuscular re-education, 97535- Self Care, and [id number]- Manual therapy  PLAN FOR NEXT SESSION: Knee mobilty and progress LLE strengthening.  Continue with focus on improving LT knee flexion  12:27 PM, 06/16/23 Culley Hedeen Small Khambrel Amsden MPT [facility] [place] [phone]

## 2023-06-20 ENCOUNTER — Ambulatory Visit (HOSPITAL_COMMUNITY)

## 2023-06-20 ENCOUNTER — Encounter (HOSPITAL_COMMUNITY): Payer: Self-pay

## 2023-06-20 DIAGNOSIS — Z96651 Presence of right artificial knee joint: Secondary | ICD-10-CM

## 2023-06-20 DIAGNOSIS — Z96652 Presence of left artificial knee joint: Secondary | ICD-10-CM | POA: Diagnosis not present

## 2023-06-20 DIAGNOSIS — G8918 Other acute postprocedural pain: Secondary | ICD-10-CM | POA: Diagnosis not present

## 2023-06-20 DIAGNOSIS — R262 Difficulty in walking, not elsewhere classified: Secondary | ICD-10-CM

## 2023-06-20 DIAGNOSIS — M25562 Pain in left knee: Secondary | ICD-10-CM | POA: Diagnosis not present

## 2023-06-20 NOTE — Therapy (Signed)
 OUTPATIENT PHYSICAL THERAPY LOWER EXTREMITY TREATMENT   Patient Name: Carrie [name] MRN: [id number] DOB:1970/03/26, 53 y.o., female Today's Date: 06/20/2023  END OF SESSION:  PT End of Session - 06/20/23 1004     Visit Number 11    Number of Visits 12    Date for PT Re-Evaluation 06/23/23    Authorization Type [employer], no auth required    Progress Note Due on Visit 12    PT Start Time 0936    PT Stop Time 1014    PT Time Calculation (min) 38 min    Equipment Utilized During Treatment Gait belt    Activity Tolerance Patient tolerated treatment well    Behavior During Therapy WFL for tasks assessed/performed                    Past Medical History:  Diagnosis Date   Arthritis    Arthrofibrosis of right total knee arthroplasty, subsequent encounter 05/20/2021   Diabetes mellitus without complication (HCC)    type 2   Essential hypertension    GERD (gastroesophageal reflux disease)    Headache    migraine   Hypothyroidism    Mixed hyperlipidemia    PONV (postoperative nausea and vomiting)    Really Sick!!! Had to stay in the hospital   Past Surgical History:  Procedure Laterality Date   ABDOMINAL HYSTERECTOMY     BIOPSY  04/25/2022   Procedure: BIOPSY;  Surgeon: Suzette Espy, MD;  Location: [facility];  Service: Endoscopy;;   COLONOSCOPY WITH PROPOFOL  N/A 04/25/2022   Procedure: COLONOSCOPY WITH PROPOFOL ;  Surgeon: Suzette Espy, MD;  Location: [facility];  Service: Endoscopy;  Laterality: N/A;  8:45 am, asa 2   ESOPHAGOGASTRODUODENOSCOPY (EGD) WITH PROPOFOL  N/A 04/25/2022   Procedure: ESOPHAGOGASTRODUODENOSCOPY (EGD) WITH PROPOFOL ;  Surgeon: Suzette Espy, MD;  Location: [facility];  Service: Endoscopy;  Laterality: N/A;   HARDWARE REMOVAL Right 04/02/2021   Procedure: Right Knee HARDWARE REMOVAL;  Surgeon: Arnie Lao, MD;  Location: [facility];  Service: Orthopedics;  Laterality: Right;   KNEE ARTHROSCOPY  02/15/2007   KNEE CLOSED  REDUCTION Right 05/20/2021   Procedure: CLOSED MANIPULATION RIGHT KNEE;  Surgeon: Arnie Lao, MD;  Location: [facility];  Service: Orthopedics;  Laterality: Right;   MALONEY DILATION N/A 04/25/2022   Procedure: Londa Rival DILATION;  Surgeon: Suzette Espy, MD;  Location: [facility];  Service: Endoscopy;  Laterality: N/A;   MASS EXCISION  02/11/2011   Procedure: EXCISION MASS;  Surgeon: Alphonso Jean, MD;  Location: [facility];  Service: Orthopedics;  Laterality: Right;  resection of neuroma right infrapatellar medial knee   PATELLA ARTHROPLASTY     rt knee-   POLYPECTOMY  04/25/2022   Procedure: POLYPECTOMY;  Surgeon: Suzette Espy, MD;  Location: [facility];  Service: Endoscopy;;   REDUCTION MAMMAPLASTY Bilateral 2017   TOTAL KNEE ARTHROPLASTY  02/14/2009   partial-Boley reg    TOTAL KNEE ARTHROPLASTY Right 04/02/2021   Procedure: Right TOTAL KNEE ARTHROPLASTY;  Surgeon: Arnie Lao, MD;  Location: [facility];  Service: Orthopedics;  Laterality: Right;   TOTAL KNEE ARTHROPLASTY Left 05/05/2023   Procedure: LEFT TOTAL KNEE ARTHROPLASTY;  Surgeon: Arnie Lao, MD;  Location: [facility];  Service: Orthopedics;  Laterality: Left;   TUBAL LIGATION     Patient Active Problem List   Diagnosis Date Noted   Status post total left knee replacement 05/05/2023  Encounter for screening colonoscopy 04/04/2022   Esophageal dysphagia 04/04/2022   Constipation 04/04/2022   Arthrofibrosis of right total knee arthroplasty, subsequent encounter 05/20/2021   Pain in right knee 04/19/2021   Stiffness of right knee, not elsewhere classified 04/19/2021   Difficulty in walking, not elsewhere classified 04/19/2021   Status post total knee replacement, right 04/02/2021   Allergic rhinitis due to pollen 06/22/2020   Eosinophilic esophagitis 06/22/2020   Rash and other nonspecific skin eruption 06/22/2020   AC joint arthropathy 01/04/2017    Acute pain of right shoulder 11/15/2016   Other derangements of patella, unspecified knee 09/02/2015   Recurrent dislocation of right patella 09/02/2015   Breast hypertrophy in female 05/13/2015   Neuroma of lower extremity 02/11/2011    Class: Chronic    PCP: [name], PA-C   REFERRING PROVIDER: Arnie Lao, MD Next apt: 05/18/23  REFERRING DIAG: Z61.096 (ICD-10-CM) - Status post total left knee replacement M17.12 (ICD-10-CM) - Unilateral primary osteoarthritis, left knee  THERAPY DIAG:  Difficulty in walking, not elsewhere classified  Status post total knee replacement, right  Rationale for Evaluation and Treatment: Rehabilitation  ONSET DATE: 05/05/2023  SUBJECTIVE:   SUBJECTIVE STATEMENT: Pt reporting a lot stiffness on Saturday, after injection, Sunday felt better and Monday felt the easiest in terms of mobility. No pain currently.   Eval:  Pt reports extended stay in hospital following sx due to BP changes and having a temperature. Pt reports being independent with all household and community ambulation with no need for AD but was in significant amount of pain. Pt would like to be able to decrease the pain level and return to being able to "get up and go." Pt reports taking compression sleeve off this morning due to pain.  PERTINENT HISTORY: Surgery was a week ago today, 05/05/23. Pt has hx of R TKA 2 years ago.  PAIN:  Are you having pain? Yes: NPRS scale: 3/10 Pain location: left knee all around Pain description: dull  Aggravating factors: walking, standing Relieving factors: sitting, iceman  PRECAUTIONS: Knee  RED FLAGS: None   WEIGHT BEARING RESTRICTIONS: No  FALLS:  Has patient fallen in last 6 months? No  LIVING ENVIRONMENT: Lives with: lives with their spouse and lives with their daughter Lives in: House/apartment Stairs: Yes: External: 1 steps; none Has following equipment at home: Single point cane and Crutches  OCCUPATION:  tax office  PLOF: Independent with household mobility without device, Independent with community mobility without device, and pain with stairs  PATIENT GOALS: get rid of pain, wants to get her life back, walk dogs, wants to get up and go  NEXT MD VISIT: 05/18/23  OBJECTIVE:  Note: Objective measures were completed at Evaluation unless otherwise noted.  DIAGNOSTIC FINDINGS:  Radiographs 3/21: CLINICAL DATA:  Status post total left knee arthroplasty.   EXAM: PORTABLE LEFT KNEE - 1-2 VIEW   COMPARISON:  Left knee radiographs 01/09/2023   FINDINGS: Interval total left knee arthroplasty. No perihardware lucency is seen to indicate hardware failure or loosening. Expected postoperative changes including intra-articular and subcutaneous air. Smalljoint effusion. Anterior surgical skin staples. No acute fracture or dislocation.   IMPRESSION: Interval total left knee arthroplasty without evidence of hardware failure.  PATIENT SURVEYS:  LEFS 30/80 = 37.5%  COGNITION: Overall cognitive status: Within functional limits for tasks assessed     SENSATION: [facility] Not tested  EDEMA:  Pt demonstrates increased swelling in LLE with pt reporting, she took compression sleeve off this morning  due to pain. Pt denies throbbing pain and is educated on the risk of DVT and importance of inflammation control.  PALPATION: Pain/tenderness/soreness reported during palpation of L knee and mid calf region.   LOWER EXTREMITY ROM:  Active ROM Right eval Left eval Left 05/23/23 Left 06/09/23 Left 06/13/23  Hip flexion       Hip extension       Hip abduction       Hip adduction       Hip internal rotation       Hip external rotation       Knee flexion 109 60 77 98   Knee extension 4 15 -10 (lacking) -2 AAROM   Ankle dorsiflexion       Ankle plantarflexion       Ankle inversion       Ankle eversion        (Blank rows = not tested)  LOWER EXTREMITY MMT: not formally assessed upon eval due to  pain  MMT Right eval Left eval  Hip flexion WFL   Hip extension [facility]   Hip abduction    Hip adduction    Hip internal rotation    Hip external rotation    Knee flexion WFL   Knee extension WFL   Ankle dorsiflexion WFL   Ankle plantarflexion WFL   Ankle inversion    Ankle eversion     (Blank rows = not tested)   FUNCTIONAL TESTS:  5 times sit to stand: TBA 2 minute walk test: 226 ft with RW with slow cadence, decreased stride length and step length bilaterally, antalgic  30 second sit to stand 7 times in split stance with UE assistance from chair GAIT: Distance walked: 100 Assistive device utilized: Environmental consultant - 2 wheeled Level of assistance: Modified independence Comments: decreased speed, decreased stride length and step length bilaterally, antalgic                                                                                                                                TREATMENT DATE:  06/20/2023  -Active mobility on recumbent bike x 8' with cues for ankle DF and limited hip hiking knee mobility-10'' second in place of "stretch" -Manual therapy-contract/relax of prone knee flexion 10 for 10'' -Manual therapy-active knee extension for 5 with 10'' passive manual applied stretch into extension -seated isolated L knee flexion on treadmill x 2'' -BLE pushing on treadmill backwards x 2'' with BUE support  06/16/23 Supine STM to left knee followed by manual ROM for flexion and extension to improve left knee joint mobility x 22' Sitting manual ROM for left knee flexion x 10 reps Bike seat 6 x half revolutions today x 12' for mobility  06/13/23 Bike seat 6 x 5' full revoluations Standing: Knee drives on 12in step 10x 10" holds for flexion Hamstring stretch 12" step 5X20" with slight OP superior knee Slant board 5X20" Knee flexion end ROM toe on 4"  step 20X Heel raises on incline slope 20X Toe raises on decline slope 20X [employer] TKE Supine:  scar massage/myofascial  techniques  Prone contract relax for flexion 3X  A/PROM for Lt knee 98 degrees flexion max    PATIENT EDUCATION:  Education details: Pt was educated on findings of PT evaluation, prognosis, frequency of therapy visits and rationale, attendance policy, and HEP if given.   Person educated: Patient Education method: Explanation Education comprehension: verbalized understanding and needs further education  HOME EXERCISE PROGRAM: Access Code: [email or web address] URL: [email or web address] Date: 05/12/2023 Prepared by: Armond Bertin  Exercises - Supine Ankle Pumps - 3 x daily - 1 sets - 15-20 reps - Supine Hip Abduction - 3 x daily - 1 sets - 15-20 reps - Supine Quad Set - 3 x daily - 1 sets - 15-20 reps - 3 sec hold - Supine Heel Slide with Strap - 3 x daily - 3 sets - 15-20 reps - 3 sec hold   - Prone Knee Extension Hang  - 2 x daily - 7 x weekly - 1 sets - 2 reps - 1'+ hold - Prone Quadriceps Stretch with Strap  - 2 x daily - 7 x weekly - 1 sets - 3 reps - 30" hold - Supine Knee Extension Mobilization with Weight  - 2 x daily - 7 x weekly - 1 sets - 1'+ hold  ASSESSMENT:  CLINICAL IMPRESSION: Pt tolerating session well. Main focus was continuing to progress mobility through passive manual techniques and then followed with active interventions. Pt with continued pain on anterior aspect of knee during mobility.  At EOS, pt's pain level 4/10.  PT remains appropriate for improving ROM and functional mobility.    Eval:Patient is a 53 y.o. female who was seen today for physical therapy evaluation and treatment for status post Left TKA, 05/05/2023. Pt demonstrates limited left knee ROM and strength, limited by pain. Pt demonstrates difficulty with ambulation and requires RW. Pt demonstrates decreased gait speed and efficiency due to pain and RW height, RW was adjusted and pt was educated on DME. Patient would continue to benefit from skilled physical therapy to increase pain free L knee ROM,  increased LLE strength for improved quality of life, community ambulation and continued progress towards therapy goals.   OBJECTIVE IMPAIRMENTS: Abnormal gait, decreased activity tolerance, decreased balance, decreased coordination, decreased knowledge of use of DME, decreased mobility, difficulty walking, decreased ROM, decreased strength, increased edema, and pain.   ACTIVITY LIMITATIONS: carrying, bending, standing, squatting, sleeping, stairs, and bed mobility  PARTICIPATION LIMITATIONS: meal prep, cleaning, laundry, shopping, community activity, occupation, and yard work  PERSONAL FACTORS: Age, Past/current experiences, and 1 comorbidity: had to stay a bit longer in the hospital following sx due to temperature and BP changes  are also affecting patient's functional outcome.   REHAB POTENTIAL: Good  CLINICAL DECISION MAKING: Stable/uncomplicated  EVALUATION COMPLEXITY: Low   GOALS: Goals reviewed with patient? No  SHORT TERM GOALS: Target date: 06/02/23  Patient will demonstrate evidence of independence with individualized HEP and will report compliance for at least 3 days per week for optimized progression towards remaining therapy goals. Baseline: following post op exercises Goal status: INITIAL  2.  Patient will report a decrease in pain level during community ambulation by at least 2 points for improved quality of life. Baseline: 4/10 Goal status: INITIAL     LONG TERM GOALS: Target date: 06/23/23  Pt will demonstrate a an increase of at least 9 points on the LEFS  for improved performance of community ambulation and ADL. Baseline: TBA next session Goal status: INITIAL  2.  Pt will improve 2 MWT to at least 578 feet in order to demonstrate age norms for functional ambulatory capacity in community setting.  Baseline: TBA as pt tolerates post op soreness Goal status: INITIAL  3.  Pt will demonstrate WFL ROM (flexion and extension) in left knee, for increased mobility and  maximal efficiency of gait cycle during ambulation. Baseline: see objective Goal status: INITIAL  4.  Pt will demonstrate at least 4-/5 MMT for left lower extremity for increased strength during ADL and community ambulation. Baseline: TBA when pain reduces Goal status: INITIAL     PLAN:  PT FREQUENCY: 2x/week  PT DURATION: 6 weeks  PLANNED INTERVENTIONS: 97110-Therapeutic exercises, 97530- Therapeutic activity, [id number]- Neuromuscular re-education, 97535- Self Care, and [id number]- Manual therapy  PLAN FOR NEXT SESSION: Knee mobilty and progress LLE strengthening.  Continue with focus on improving LT knee flexion. Progress note next session extended out for another 4 weeks.   10:15 AM, 06/20/23 Gatha Kaska PT, DPT [facility] [place] [phone] office

## 2023-06-22 ENCOUNTER — Ambulatory Visit (HOSPITAL_COMMUNITY)

## 2023-06-22 ENCOUNTER — Encounter (HOSPITAL_COMMUNITY): Payer: Self-pay

## 2023-06-22 DIAGNOSIS — R262 Difficulty in walking, not elsewhere classified: Secondary | ICD-10-CM

## 2023-06-22 DIAGNOSIS — Z96652 Presence of left artificial knee joint: Secondary | ICD-10-CM

## 2023-06-22 DIAGNOSIS — M25562 Pain in left knee: Secondary | ICD-10-CM | POA: Diagnosis not present

## 2023-06-22 DIAGNOSIS — Z96651 Presence of right artificial knee joint: Secondary | ICD-10-CM | POA: Diagnosis not present

## 2023-06-22 DIAGNOSIS — G8918 Other acute postprocedural pain: Secondary | ICD-10-CM | POA: Diagnosis not present

## 2023-06-22 NOTE — Therapy (Signed)
 OUTPATIENT PHYSICAL THERAPY LOWER EXTREMITY PROGRESS NOTE Progress Note Reporting Period 05/12/23 to 06/23/23  See note below for Objective Data and Assessment of Progress/Goals.      Patient Name: Carrie [name] MRN: [id number] DOB:Oct 01, 1970, 53 y.o., female Today's Date: 06/22/2023  END OF SESSION:  PT End of Session - 06/22/23 1100     Visit Number 12    Number of Visits 20    Date for PT Re-Evaluation 06/23/23    Authorization Type [employer], no auth required    Progress Note Due on Visit 20    PT Start Time 1020    PT Stop Time 1059    PT Time Calculation (min) 39 min    Equipment Utilized During Treatment Gait belt    Activity Tolerance Patient tolerated treatment well    Behavior During Therapy WFL for tasks assessed/performed             Past Medical History:  Diagnosis Date   Arthritis    Arthrofibrosis of right total knee arthroplasty, subsequent encounter 05/20/2021   Diabetes mellitus without complication (HCC)    type 2   Essential hypertension    GERD (gastroesophageal reflux disease)    Headache    migraine   Hypothyroidism    Mixed hyperlipidemia    PONV (postoperative nausea and vomiting)    Really Sick!!! Had to stay in the hospital   Past Surgical History:  Procedure Laterality Date   ABDOMINAL HYSTERECTOMY     BIOPSY  04/25/2022   Procedure: BIOPSY;  Surgeon: Suzette Espy, MD;  Location: [facility];  Service: Endoscopy;;   COLONOSCOPY WITH PROPOFOL  N/A 04/25/2022   Procedure: COLONOSCOPY WITH PROPOFOL ;  Surgeon: Suzette Espy, MD;  Location: [facility];  Service: Endoscopy;  Laterality: N/A;  8:45 am, asa 2   ESOPHAGOGASTRODUODENOSCOPY (EGD) WITH PROPOFOL  N/A 04/25/2022   Procedure: ESOPHAGOGASTRODUODENOSCOPY (EGD) WITH PROPOFOL ;  Surgeon: Suzette Espy, MD;  Location: [facility];  Service: Endoscopy;  Laterality: N/A;   HARDWARE REMOVAL Right 04/02/2021   Procedure: Right Knee HARDWARE REMOVAL;  Surgeon: Arnie Lao,  MD;  Location: [facility];  Service: Orthopedics;  Laterality: Right;   KNEE ARTHROSCOPY  02/15/2007   KNEE CLOSED REDUCTION Right 05/20/2021   Procedure: CLOSED MANIPULATION RIGHT KNEE;  Surgeon: Arnie Lao, MD;  Location: [facility];  Service: Orthopedics;  Laterality: Right;   MALONEY DILATION N/A 04/25/2022   Procedure: Londa Rival DILATION;  Surgeon: Suzette Espy, MD;  Location: [facility];  Service: Endoscopy;  Laterality: N/A;   MASS EXCISION  02/11/2011   Procedure: EXCISION MASS;  Surgeon: Alphonso Jean, MD;  Location:  [facility];  Service: Orthopedics;  Laterality: Right;  resection of neuroma right infrapatellar medial knee   PATELLA ARTHROPLASTY     rt knee-   POLYPECTOMY  04/25/2022   Procedure: POLYPECTOMY;  Surgeon: Suzette Espy, MD;  Location: [facility];  Service: Endoscopy;;   REDUCTION MAMMAPLASTY Bilateral 2017   TOTAL KNEE ARTHROPLASTY  02/14/2009   partial-[place]    TOTAL KNEE ARTHROPLASTY Right 04/02/2021   Procedure: Right TOTAL KNEE ARTHROPLASTY;  Surgeon: Arnie Lao, MD;  Location: [facility];  Service: Orthopedics;  Laterality: Right;   TOTAL KNEE ARTHROPLASTY Left 05/05/2023   Procedure: LEFT TOTAL KNEE ARTHROPLASTY;  Surgeon: Arnie Lao, MD;  Location: [facility];  Service: Orthopedics;  Laterality: Left;   TUBAL LIGATION     Patient Active Problem List  Diagnosis Date Noted   Status post total left knee replacement 05/05/2023   Encounter for screening colonoscopy 04/04/2022   Esophageal dysphagia 04/04/2022   Constipation 04/04/2022   Arthrofibrosis of right total knee arthroplasty, subsequent encounter 05/20/2021   Pain in right knee 04/19/2021   Stiffness of right knee, not elsewhere classified 04/19/2021   Difficulty in walking, not elsewhere classified 04/19/2021   Status post total knee replacement, right 04/02/2021   Allergic rhinitis due to pollen 06/22/2020   Eosinophilic  esophagitis 06/22/2020   Rash and other nonspecific skin eruption 06/22/2020   AC joint arthropathy 01/04/2017   Acute pain of right shoulder 11/15/2016   Other derangements of patella, unspecified knee 09/02/2015   Recurrent dislocation of right patella 09/02/2015   Breast hypertrophy in female 05/13/2015   Neuroma of lower extremity 02/11/2011    Class: Chronic    PCP: [name], PA-C   REFERRING PROVIDER: Arnie Lao, MD Next apt: 05/18/23  REFERRING DIAG: N82.956 (ICD-10-CM) - Status post total left knee replacement M17.12 (ICD-10-CM) - Unilateral primary osteoarthritis, left knee  THERAPY DIAG:  Status post total left knee replacement  Difficulty in walking, not elsewhere classified  Rationale for Evaluation and Treatment: Rehabilitation  ONSET DATE: 05/05/2023  SUBJECTIVE:   SUBJECTIVE STATEMENT: Pt reporting less stiffness and increased mobility yesterday. Pt reporting 3/10 pain today.   Eval:  Pt reports extended stay in hospital following sx due to BP changes and having a temperature. Pt reports being independent with all household and community ambulation with no need for AD but was in significant amount of pain. Pt would like to be able to decrease the pain level and return to being able to "get up and go." Pt reports taking compression sleeve off this morning due to pain.  PERTINENT HISTORY: Surgery was a week ago today, 05/05/23. Pt has hx of R TKA 2 years ago.  PAIN:  Are you having pain? Yes: NPRS scale: 3/10 Pain location: left knee all around Pain description: dull  Aggravating factors: walking, standing Relieving factors: sitting, iceman  PRECAUTIONS: Knee  RED FLAGS: None   WEIGHT BEARING RESTRICTIONS: No  FALLS:  Has patient fallen in last 6 months? No  LIVING ENVIRONMENT: Lives with: lives with their spouse and lives with their daughter Lives in: House/apartment Stairs: Yes: External: 1 steps; none Has following  equipment at home: Single point cane and Crutches  OCCUPATION: tax office  PLOF: Independent with household mobility without device, Independent with community mobility without device, and pain with stairs  PATIENT GOALS: get rid of pain, wants to get her life back, walk dogs, wants to get up and go  NEXT MD VISIT: 05/18/23  OBJECTIVE:  Note: Objective measures were completed at Evaluation unless otherwise noted.  DIAGNOSTIC FINDINGS:  Radiographs 3/21: CLINICAL DATA:  Status post total left knee arthroplasty.   EXAM: PORTABLE LEFT KNEE - 1-2 VIEW   COMPARISON:  Left knee radiographs 01/09/2023   FINDINGS: Interval total left knee arthroplasty. No perihardware lucency is seen to indicate hardware failure or loosening. Expected postoperative changes including intra-articular and subcutaneous air. Smalljoint effusion. Anterior surgical skin staples. No acute fracture or dislocation.   IMPRESSION: Interval total left knee arthroplasty without evidence of hardware failure.  PATIENT SURVEYS:  LEFS 30/80 = 37.5%  COGNITION: Overall cognitive status: Within functional limits for tasks assessed     SENSATION: [facility] Not tested  EDEMA:  Pt demonstrates increased swelling in LLE with pt reporting, she took compression sleeve  off this morning due to pain. Pt denies throbbing pain and is educated on the risk of DVT and importance of inflammation control.  PALPATION: Pain/tenderness/soreness reported during palpation of L knee and mid calf region.   LOWER EXTREMITY ROM:  Active ROM Right eval Left eval Left 05/23/23 Left 06/09/23 Left 06/13/23 Left 06/22/2023  Hip flexion        Hip extension        Hip abduction        Hip adduction        Hip internal rotation        Hip external rotation        Knee flexion 109 60 77 98  94  Knee extension 4 15 -10 (lacking) -2 AAROM  -3  Ankle dorsiflexion        Ankle plantarflexion        Ankle inversion        Ankle eversion          (Blank rows = not tested)  LOWER EXTREMITY MMT: not formally assessed upon eval due to pain  MMT Right eval Left eval  Hip flexion WFL   Hip extension [facility]   Hip abduction    Hip adduction    Hip internal rotation    Hip external rotation    Knee flexion WFL   Knee extension WFL   Ankle dorsiflexion WFL   Ankle plantarflexion WFL   Ankle inversion    Ankle eversion     (Blank rows = not tested)   FUNCTIONAL TESTS:  5 times sit to stand: TBA 2 minute walk test: 226 ft with RW with slow cadence, decreased stride length and step length bilaterally, antalgic  30 second sit to stand 7 times in split stance with UE assistance from chair  06/22/2023   30 Second chair stand test: 12x : 469ft GAIT: Distance walked: 100 Assistive device utilized: Environmental consultant - 2 wheeled Level of assistance: Modified independence Comments: decreased speed, decreased stride length and step length bilaterally, antalgic                                                                                                                                TREATMENT DATE:  06/22/2023  -Recumbent bike x 7' -ROM -STM x 8' with prolonged flexion hold over therapist arm in hooklying position -LEFS 60/80= 75% -30 Second Chair Stand test -  06/20/2023  -Active mobility on recumbent bike x 8' with cues for ankle DF and limited hip hiking knee mobility-10'' second in place of "stretch" -Manual therapy-contract/relax of prone knee flexion 10 for 10'' -Manual therapy-active knee extension for 5 with 10'' passive manual applied stretch into extension -seated isolated L knee flexion on treadmill x 2'' -BLE pushing on treadmill backwards x 2'' with BUE support  06/16/23 Supine STM to left knee followed by manual ROM for flexion and extension to improve left knee joint mobility x 22' Sitting manual  ROM for left knee flexion x 10 reps Bike seat 6 x half revolutions today x 12' for mobility     PATIENT EDUCATION:   Education details: Pt was educated on findings of PT evaluation, prognosis, frequency of therapy visits and rationale, attendance policy, and HEP if given.   Person educated: Patient Education method: Explanation Education comprehension: verbalized understanding and needs further education  HOME EXERCISE PROGRAM: Access Code: [email or web address] URL: [email or web address] Date: 05/12/2023 Prepared by: Armond Bertin  Exercises - Supine Ankle Pumps - 3 x daily - 1 sets - 15-20 reps - Supine Hip Abduction - 3 x daily - 1 sets - 15-20 reps - Supine Quad Set - 3 x daily - 1 sets - 15-20 reps - 3 sec hold - Supine Heel Slide with Strap - 3 x daily - 3 sets - 15-20 reps - 3 sec hold   - Prone Knee Extension Hang  - 2 x daily - 7 x weekly - 1 sets - 2 reps - 1'+ hold - Prone Quadriceps Stretch with Strap  - 2 x daily - 7 x weekly - 1 sets - 3 reps - 30" hold - Supine Knee Extension Mobilization with Weight  - 2 x daily - 7 x weekly - 1 sets - 1'+ hold  ASSESSMENT:  CLINICAL IMPRESSION: Progress note completed today.  Patient noted with improvement in functional outcome measures, noticeable improvements in quality of movement.  However patient continues to lack appropriate and within functional limit range of motion.  Patient 94-0-3 for left knee range of motion as updated today.  Short-term and long-term goals 4 weeks out.  PT remains appropriate for improving ROM and functional mobility.    Eval:Patient is a 53 y.o. female who was seen today for physical therapy evaluation and treatment for status post Left TKA, 05/05/2023. Pt demonstrates limited left knee ROM and strength, limited by pain. Pt demonstrates difficulty with ambulation and requires RW. Pt demonstrates decreased gait speed and efficiency due to pain and RW height, RW was adjusted and pt was educated on DME. Patient would continue to benefit from skilled physical therapy to increase pain free L knee ROM, increased LLE strength for  improved quality of life, community ambulation and continued progress towards therapy goals.   OBJECTIVE IMPAIRMENTS: Abnormal gait, decreased activity tolerance, decreased balance, decreased coordination, decreased knowledge of use of DME, decreased mobility, difficulty walking, decreased ROM, decreased strength, increased edema, and pain.   ACTIVITY LIMITATIONS: carrying, bending, standing, squatting, sleeping, stairs, and bed mobility  PARTICIPATION LIMITATIONS: meal prep, cleaning, laundry, shopping, community activity, occupation, and yard work  PERSONAL FACTORS: Age, Past/current experiences, and 1 comorbidity: had to stay a bit longer in the hospital following sx due to temperature and BP changes are also affecting patient's functional outcome.   REHAB POTENTIAL: Good  CLINICAL DECISION MAKING: Stable/uncomplicated  EVALUATION COMPLEXITY: Low   GOALS: Goals reviewed with patient? No  SHORT TERM GOALS: Target date:07/06/23  Patient will demonstrate evidence of independence with individualized HEP and will report compliance for at least 3 days per week for optimized progression towards remaining therapy goals. Baseline: following post op exercises Goal status: IN PROGRESS  2.  Patient will report a decrease in pain level during community ambulation by at least 2 points for improved quality of life. Baseline: 4/10 Goal status: IN PROGRESS     LONG TERM GOALS: Target date: 07/20/23  Pt will demonstrate a an increase of at least 9 points on the LEFS for improved performance  of community ambulation and ADL. Baseline: TBA next session Goal status: MET  2.  Pt will improve 2 MWT to at least 578 feet in order to demonstrate age norms for functional ambulatory capacity in community setting.  Baseline: TBA as pt tolerates post op soreness Goal status: IN PROGRESS  3.  Pt will demonstrate WFL ROM (flexion and extension) in left knee, for increased mobility and maximal efficiency of  gait cycle during ambulation. Baseline: see objective Goal status: IN PROGRESS  4.  Pt will demonstrate at least 4-/5 MMT for left lower extremity for increased strength during ADL and community ambulation. Baseline: TBA when pain reduces Goal status: IN PROGRESS     PLAN:  PT FREQUENCY: 2x/week  PT DURATION: 4 weeks  PLANNED INTERVENTIONS: [id number]- PT Re-evaluation, 97750- Physical Performance Testing, 97110-Therapeutic exercises, 97530- Therapeutic activity, [id number]- Neuromuscular re-education, 97535- Self Care, [id number]- Manual therapy, Patient/Family education, Balance training, and Stair training  PLAN FOR NEXT SESSION: Knee mobilty and progress LLE strengthening.  Continue with focus on improving LT knee flexion. Progress note next session extended out for another 4 weeks.   11:03 AM, 06/22/23 Gatha Kaska PT, DPT [facility] [place] [phone] office

## 2023-06-27 ENCOUNTER — Encounter (HOSPITAL_COMMUNITY): Admitting: Physical Therapy

## 2023-06-28 ENCOUNTER — Ambulatory Visit (HOSPITAL_COMMUNITY)

## 2023-06-28 DIAGNOSIS — R262 Difficulty in walking, not elsewhere classified: Secondary | ICD-10-CM

## 2023-06-28 DIAGNOSIS — M25562 Pain in left knee: Secondary | ICD-10-CM | POA: Diagnosis not present

## 2023-06-28 DIAGNOSIS — Z96652 Presence of left artificial knee joint: Secondary | ICD-10-CM

## 2023-06-28 DIAGNOSIS — G8918 Other acute postprocedural pain: Secondary | ICD-10-CM | POA: Diagnosis not present

## 2023-06-28 DIAGNOSIS — Z96651 Presence of right artificial knee joint: Secondary | ICD-10-CM | POA: Diagnosis not present

## 2023-06-28 NOTE — Therapy (Signed)
 OUTPATIENT PHYSICAL THERAPY LOWER EXTREMITY TREATMENT    Patient Name: Carrie [name] MRN: [id number] DOB:1970-09-02, 53 y.o., female Today's Date: 06/28/2023  END OF SESSION:  PT End of Session - 06/28/23 0806     Visit Number 13    Number of Visits 20    Date for PT Re-Evaluation 07/20/23    Authorization Type [employer], no auth required    Progress Note Due on Visit 20    PT Start Time 0805    PT Stop Time 0845    PT Time Calculation (min) 40 min    Equipment Utilized During Treatment Gait belt    Activity Tolerance Patient tolerated treatment well    Behavior During Therapy WFL for tasks assessed/performed             Past Medical History:  Diagnosis Date   Arthritis    Arthrofibrosis of right total knee arthroplasty, subsequent encounter 05/20/2021   Diabetes mellitus without complication (HCC)    type 2   Essential hypertension    GERD (gastroesophageal reflux disease)    Headache    migraine   Hypothyroidism    Mixed hyperlipidemia    PONV (postoperative nausea and vomiting)    Really Sick!!! Had to stay in the hospital   Past Surgical History:  Procedure Laterality Date   ABDOMINAL HYSTERECTOMY     BIOPSY  04/25/2022   Procedure: BIOPSY;  Surgeon: Suzette Espy, MD;  Location: [facility];  Service: Endoscopy;;   COLONOSCOPY WITH PROPOFOL  N/A 04/25/2022   Procedure: COLONOSCOPY WITH PROPOFOL ;  Surgeon: Suzette Espy, MD;  Location: [facility];  Service: Endoscopy;  Laterality: N/A;  8:45 am, asa 2   ESOPHAGOGASTRODUODENOSCOPY (EGD) WITH PROPOFOL  N/A 04/25/2022   Procedure: ESOPHAGOGASTRODUODENOSCOPY (EGD) WITH PROPOFOL ;  Surgeon: Suzette Espy, MD;  [facility]: [facility];  Service: Endoscopy;  Laterality: N/A;   HARDWARE REMOVAL Right 04/02/2021   Procedure: Right Knee HARDWARE REMOVAL;  Surgeon: Arnie Lao, MD;  Location: [facility];  Service: Orthopedics;  Laterality: Right;   KNEE ARTHROSCOPY  02/15/2007   KNEE CLOSED REDUCTION  Right 05/20/2021   Procedure: CLOSED MANIPULATION RIGHT KNEE;  Surgeon: Arnie Lao, MD;  Location: [facility];  Service: Orthopedics;  Laterality: Right;   MALONEY DILATION N/A 04/25/2022   Procedure: Londa Rival DILATION;  Surgeon: Suzette Espy, MD;  Location: [facility];  Service: Endoscopy;  Laterality: N/A;   MASS EXCISION  02/11/2011   Procedure: EXCISION MASS;  Surgeon: Alphonso Jean, MD;  Location: [facility];  Service: Orthopedics;  Laterality: Right;  resection of neuroma right infrapatellar medial knee   PATELLA ARTHROPLASTY     rt knee-   POLYPECTOMY  04/25/2022   Procedure: POLYPECTOMY;  Surgeon: Suzette Espy, MD;  [facility]: [facility];  Service: Endoscopy;;   REDUCTION MAMMAPLASTY Bilateral 2017   TOTAL KNEE ARTHROPLASTY  02/14/2009   partial-[place] [facility]    TOTAL KNEE ARTHROPLASTY Right 04/02/2021   Procedure: Right TOTAL KNEE ARTHROPLASTY;  Surgeon: Arnie Lao, MD;  Location: [facility];  Service: Orthopedics;  Laterality: Right;   TOTAL KNEE ARTHROPLASTY Left 05/05/2023   Procedure: LEFT TOTAL KNEE ARTHROPLASTY;  Surgeon: Arnie Lao, MD;  Location: [facility];  Service: Orthopedics;  Laterality: Left;   TUBAL LIGATION     Patient Active Problem List   Diagnosis Date Noted   Status post total left knee replacement 05/05/2023   Encounter for screening colonoscopy 04/04/2022  Esophageal dysphagia 04/04/2022   Constipation 04/04/2022   Arthrofibrosis of right total knee arthroplasty, subsequent encounter 05/20/2021   Pain in right knee 04/19/2021   Stiffness of right knee, not elsewhere classified 04/19/2021   Difficulty in walking, not elsewhere classified 04/19/2021   Status post total knee replacement, right 04/02/2021   Allergic rhinitis due to pollen 06/22/2020   Eosinophilic esophagitis 06/22/2020   Rash and other nonspecific skin eruption 06/22/2020   AC joint arthropathy 01/04/2017   Acute pain  of right shoulder 11/15/2016   Other derangements of patella, unspecified knee 09/02/2015   Recurrent dislocation of right patella 09/02/2015   Breast hypertrophy in female 05/13/2015   Neuroma of lower extremity 02/11/2011    Class: Chronic    PCP: [name], PA-C   REFERRING PROVIDER: Arnie Lao, MD Next apt: 05/18/23  REFERRING DIAG: B28.413 (ICD-10-CM) - Status post total left knee replacement M17.12 (ICD-10-CM) - Unilateral primary osteoarthritis, left knee  THERAPY DIAG:  Status post total left knee replacement  Difficulty in walking, not elsewhere classified  Rationale for Evaluation and Treatment: Rehabilitation  ONSET DATE: 05/05/2023  SUBJECTIVE:   SUBJECTIVE STATEMENT: Feel ok; 1/10 soreness; just really stiff  Eval:  Pt reports extended stay in hospital following sx due to BP changes and having a temperature. Pt reports being independent with all household and community ambulation with no need for AD but was in significant amount of pain. Pt would like to be able to decrease the pain level and return to being able to "get up and go." Pt reports taking compression sleeve off this morning due to pain.  PERTINENT HISTORY: Surgery was a week ago today, 05/05/23. Pt has hx of R TKA 2 years ago.  PAIN:  Are you having pain? Yes: NPRS scale: 3/10 Pain location: left knee all around Pain description: dull  Aggravating factors: walking, standing Relieving factors: sitting, iceman  PRECAUTIONS: Knee  RED FLAGS: None   WEIGHT BEARING RESTRICTIONS: No  FALLS:  Has patient fallen in last 6 months? No  LIVING ENVIRONMENT: Lives with: lives with their spouse and lives with their daughter Lives in: House/apartment Stairs: Yes: External: 1 steps; none Has following equipment at home: Single point cane and Crutches  OCCUPATION: tax office  PLOF: Independent with household mobility without device, Independent with community mobility without  device, and pain with stairs  PATIENT GOALS: get rid of pain, wants to get her life back, walk dogs, wants to get up and go  NEXT MD VISIT: 05/18/23  OBJECTIVE:  Note: Objective measures were completed at Evaluation unless otherwise noted.  DIAGNOSTIC FINDINGS:  Radiographs 3/21: CLINICAL DATA:  Status post total left knee arthroplasty.   EXAM: PORTABLE LEFT KNEE - 1-2 VIEW   COMPARISON:  Left knee radiographs 01/09/2023   FINDINGS: Interval total left knee arthroplasty. No perihardware lucency is seen to indicate hardware failure or loosening. Expected postoperative changes including intra-articular and subcutaneous air. Smalljoint effusion. Anterior surgical skin staples. No acute fracture or dislocation.   IMPRESSION: Interval total left knee arthroplasty without evidence of hardware failure.  PATIENT SURVEYS:  LEFS 30/80 = 37.5%  COGNITION: Overall cognitive status: Within functional limits for tasks assessed     SENSATION: [facility] Not tested  EDEMA:  Pt demonstrates increased swelling in LLE with pt reporting, she took compression sleeve off this morning due to pain. Pt denies throbbing pain and is educated on the risk of DVT and importance of inflammation control.  PALPATION: Pain/tenderness/soreness reported during  palpation of L knee and mid calf region.   LOWER EXTREMITY ROM:  Active ROM Right eval Left eval Left 05/23/23 Left 06/09/23 Left 06/13/23 Left 06/22/2023 Left 06/28/23  Hip flexion         Hip extension         Hip abduction         Hip adduction         Hip internal rotation         Hip external rotation         Knee flexion 109 60 77 98  94 105 AAROM  Knee extension 4 15 -10 (lacking) -2 AAROM  -3 -7, 0 AAROM  Ankle dorsiflexion         Ankle plantarflexion         Ankle inversion         Ankle eversion          (Blank rows = not tested)  LOWER EXTREMITY MMT: not formally assessed upon eval due to pain  MMT Right eval Left eval  Hip  flexion WFL   Hip extension [facility]   Hip abduction    Hip adduction    Hip internal rotation    Hip external rotation    Knee flexion WFL   Knee extension WFL   Ankle dorsiflexion WFL   Ankle plantarflexion WFL   Ankle inversion    Ankle eversion     (Blank rows = not tested)   FUNCTIONAL TESTS:  5 times sit to stand: TBA 2 minute walk test: 226 ft with RW with slow cadence, decreased stride length and step length bilaterally, antalgic  30 second sit to stand 7 times in split stance with UE assistance from chair  06/22/2023   30 Second chair stand test: 12x : 464ft GAIT: Distance walked: 100 Assistive device utilized: Environmental consultant - 2 wheeled Level of assistance: Modified independence Comments: decreased speed, decreased stride length and step length bilaterally, antalgic                                                                                                                                TREATMENT DATE:  06/28/23 STM to left knee followed by manual ROM to improve left knee mobility and decrease soft tissue tightness x 15' Bike seat 6' x 5' full revolutions Heel raises 2 x 10 Slant board 5 x 20" Knee flexion drives on 12" box x 2\' 12"  box hamstring stretch 10" x 2' Squats x 10   06/22/2023  -Recumbent bike x 7' -ROM -STM x 8' with prolonged flexion hold over therapist arm in hooklying position -LEFS 60/80= 75% -30 Second Chair Stand test -  06/20/2023  -Active mobility on recumbent bike x 8' with cues for ankle DF and limited hip hiking knee mobility-10'' second in place of "stretch" -Manual therapy-contract/relax of prone knee flexion 10 for 10'' -Manual therapy-active knee extension for 5 with 10'' passive manual applied  stretch into extension -seated isolated L knee flexion on treadmill x 2'' -BLE pushing on treadmill backwards x 2'' with BUE support  06/16/23 Supine STM to left knee followed by manual ROM for flexion and extension to improve left knee joint  mobility x 22' Sitting manual ROM for left knee flexion x 10 reps Bike seat 6 x half revolutions today x 12' for mobility     PATIENT EDUCATION:  Education details: Pt was educated on findings of PT evaluation, prognosis, frequency of therapy visits and rationale, attendance policy, and HEP if given.   Person educated: Patient Education method: Explanation Education comprehension: verbalized understanding and needs further education  HOME EXERCISE PROGRAM: Access Code: [email or web address] URL: [email or web address] Date: 05/12/2023 Prepared by: Armond Bertin  Exercises - Supine Ankle Pumps - 3 x daily - 1 sets - 15-20 reps - Supine Hip Abduction - 3 x daily - 1 sets - 15-20 reps - Supine Quad Set - 3 x daily - 1 sets - 15-20 reps - 3 sec hold - Supine Heel Slide with Strap - 3 x daily - 3 sets - 15-20 reps - 3 sec hold   - Prone Knee Extension Hang  - 2 x daily - 7 x weekly - 1 sets - 2 reps - 1'+ hold - Prone Quadriceps Stretch with Strap  - 2 x daily - 7 x weekly - 1 sets - 3 reps - 30" hold - Supine Knee Extension Mobilization with Weight  - 2 x daily - 7 x weekly - 1 sets - 1'+ hold  ASSESSMENT:  CLINICAL IMPRESSION: Today's session with continued focus on left knee mobility; well healing incision noted no oozing or draining completely closed.  Good progress with knee flexion today; continues with slight extension lag.  Continued to encourage patient to work on mobility at home.  Patient will benefit from continued skilled therapy services to address deficits and promote return to optimal function.       Eval:Patient is a 53 y.o. female who was seen today for physical therapy evaluation and treatment for status post Left TKA, 05/05/2023. Pt demonstrates limited left knee ROM and strength, limited by pain. Pt demonstrates difficulty with ambulation and requires RW. Pt demonstrates decreased gait speed and efficiency due to pain and RW height, RW was adjusted and pt was  educated on DME. Patient would continue to benefit from skilled physical therapy to increase pain free L knee ROM, increased LLE strength for improved quality of life, community ambulation and continued progress towards therapy goals.   OBJECTIVE IMPAIRMENTS: Abnormal gait, decreased activity tolerance, decreased balance, decreased coordination, decreased knowledge of use of DME, decreased mobility, difficulty walking, decreased ROM, decreased strength, increased edema, and pain.   ACTIVITY LIMITATIONS: carrying, bending, standing, squatting, sleeping, stairs, and bed mobility  PARTICIPATION LIMITATIONS: meal prep, cleaning, laundry, shopping, community activity, occupation, and yard work  PERSONAL FACTORS: Age, Past/current experiences, and 1 comorbidity: had to stay a bit longer in the hospital following sx due to temperature and BP changes are also affecting patient's functional outcome.   REHAB POTENTIAL: Good  CLINICAL DECISION MAKING: Stable/uncomplicated  EVALUATION COMPLEXITY: Low   GOALS: Goals reviewed with patient? No  SHORT TERM GOALS: Target date:07/06/23  Patient will demonstrate evidence of independence with individualized HEP and will report compliance for at least 3 days per week for optimized progression towards remaining therapy goals. Baseline: following post op exercises Goal status: IN PROGRESS  2.  Patient will report a decrease in pain level  during community ambulation by at least 2 points for improved quality of life. Baseline: 4/10 Goal status: IN PROGRESS     LONG TERM GOALS: Target date: 07/20/23  Pt will demonstrate a an increase of at least 9 points on the LEFS for improved performance of community ambulation and ADL. Baseline: TBA next session Goal status: MET  2.  Pt will improve 2 MWT to at least 578 feet in order to demonstrate age norms for functional ambulatory capacity in community setting.  Baseline: TBA as pt tolerates post op soreness Goal  status: IN PROGRESS  3.  Pt will demonstrate WFL ROM (flexion and extension) in left knee, for increased mobility and maximal efficiency of gait cycle during ambulation. Baseline: see objective Goal status: IN PROGRESS  4.  Pt will demonstrate at least 4-/5 MMT for left lower extremity for increased strength during ADL and community ambulation. Baseline: TBA when pain reduces Goal status: IN PROGRESS     PLAN:  PT FREQUENCY: 2x/week  PT DURATION: 4 weeks  PLANNED INTERVENTIONS: 97164- PT Re-evaluation, 97750- Physical Performance Testing, 97110-Therapeutic exercises, 97530- Therapeutic activity, [id number]- Neuromuscular re-education, 97535- Self Care, [id number]- Manual therapy, Patient/Family education, Balance training, and Stair training  PLAN FOR NEXT SESSION: Knee mobilty and progress LLE strengthening.  Continue with focus on improving LT knee flexion.   8:45 AM, 06/28/23 Dekota Shenk Small Gumaro Brightbill MPT [facility] [place] [phone]

## 2023-06-29 ENCOUNTER — Other Ambulatory Visit: Payer: Self-pay | Admitting: Orthopaedic Surgery

## 2023-06-29 MED ORDER — OXYCODONE HCL 5 MG PO TABS
5.0000 mg | ORAL_TABLET | Freq: Three times a day (TID) | ORAL | 0 refills | Status: AC | PRN
Start: 2023-06-29 — End: ?

## 2023-07-05 ENCOUNTER — Ambulatory Visit (HOSPITAL_COMMUNITY)

## 2023-07-05 DIAGNOSIS — Z96652 Presence of left artificial knee joint: Secondary | ICD-10-CM | POA: Diagnosis not present

## 2023-07-05 DIAGNOSIS — Z96651 Presence of right artificial knee joint: Secondary | ICD-10-CM | POA: Diagnosis not present

## 2023-07-05 DIAGNOSIS — R262 Difficulty in walking, not elsewhere classified: Secondary | ICD-10-CM

## 2023-07-05 DIAGNOSIS — G8918 Other acute postprocedural pain: Secondary | ICD-10-CM | POA: Diagnosis not present

## 2023-07-05 DIAGNOSIS — M25562 Pain in left knee: Secondary | ICD-10-CM | POA: Diagnosis not present

## 2023-07-05 NOTE — Therapy (Signed)
 OUTPATIENT PHYSICAL THERAPY LOWER EXTREMITY TREATMENT    Patient Name: Carrie [name] MRN: [id number] DOB:10-05-1970, 53 y.o., female Today's Date: 07/05/2023  END OF SESSION:  PT End of Session - 07/05/23 0901     Visit Number 14    Number of Visits 20    Date for PT Re-Evaluation 07/20/23    Authorization Type [employer], no auth required    Progress Note Due on Visit 20    PT Start Time 0815    PT Stop Time 0855    PT Time Calculation (min) 40 min    Activity Tolerance Patient tolerated treatment well    Behavior During Therapy [facility] for tasks assessed/performed            Past Medical History:  Diagnosis Date   Arthritis    Arthrofibrosis of right total knee arthroplasty, subsequent encounter 05/20/2021   Diabetes mellitus without complication (HCC)    type 2   Essential hypertension    GERD (gastroesophageal reflux disease)    Headache    migraine   Hypothyroidism    Mixed hyperlipidemia    PONV (postoperative nausea and vomiting)    Really Sick!!! Had to stay in the hospital   Past Surgical History:  Procedure Laterality Date   ABDOMINAL HYSTERECTOMY     BIOPSY  04/25/2022   Procedure: BIOPSY;  Surgeon: Suzette Espy, MD;  [facility];  Service: Endoscopy;;   COLONOSCOPY WITH PROPOFOL  N/A 04/25/2022   Procedure: COLONOSCOPY WITH PROPOFOL ;  Surgeon: Suzette Espy, MD;  [facility]: [facility];  Service: Endoscopy;  Laterality: N/A;  8:45 am, asa 2   ESOPHAGOGASTRODUODENOSCOPY (EGD) WITH PROPOFOL  N/A 04/25/2022   Procedure: ESOPHAGOGASTRODUODENOSCOPY (EGD) WITH PROPOFOL ;  Surgeon: Suzette Espy, MD;  [facility];  Service: Endoscopy;  Laterality: N/A;   HARDWARE REMOVAL Right 04/02/2021   Procedure: Right Knee HARDWARE REMOVAL;  Surgeon: Arnie Lao, MD;  Location: [facility];  Service: Orthopedics;  Laterality: Right;   KNEE ARTHROSCOPY  02/15/2007   KNEE CLOSED REDUCTION Right 05/20/2021   Procedure: CLOSED MANIPULATION RIGHT  KNEE;  Surgeon: Arnie Lao, MD;  Location: [facility];  Service: Orthopedics;  Laterality: Right;   MALONEY DILATION N/A 04/25/2022   Procedure: Londa Rival DILATION;  Surgeon: Suzette Espy, MD;  [facility];  Service: Endoscopy;  Laterality: N/A;   MASS EXCISION  02/11/2011   Procedure: EXCISION MASS;  Surgeon: Alphonso Jean, MD;  Location: [facility];  Service: Orthopedics;  Laterality: Right;  resection of neuroma right infrapatellar medial knee   PATELLA ARTHROPLASTY     rt knee-   POLYPECTOMY  04/25/2022   Procedure: POLYPECTOMY;  Surgeon: Suzette Espy, MD;  [facility]: [facility];  Service: Endoscopy;;   REDUCTION MAMMAPLASTY Bilateral 2017   TOTAL KNEE ARTHROPLASTY  02/14/2009   partial-[place] [facility]    TOTAL KNEE ARTHROPLASTY Right 04/02/2021   Procedure: Right TOTAL KNEE ARTHROPLASTY;  Surgeon: Arnie Lao, MD;  Location: [facility];  Service: Orthopedics;  Laterality: Right;   TOTAL KNEE ARTHROPLASTY Left 05/05/2023   Procedure: LEFT TOTAL KNEE ARTHROPLASTY;  Surgeon: Arnie Lao, MD;  Location: [facility];  Service: Orthopedics;  Laterality: Left;   TUBAL LIGATION     Patient Active Problem List   Diagnosis Date Noted   Status post total left knee replacement 05/05/2023   Encounter for screening colonoscopy 04/04/2022   Esophageal dysphagia 04/04/2022   Constipation 04/04/2022  Arthrofibrosis of right total knee arthroplasty, subsequent encounter 05/20/2021   Pain in right knee 04/19/2021   Stiffness of right knee, not elsewhere classified 04/19/2021   Difficulty in walking, not elsewhere classified 04/19/2021   Status post total knee replacement, right 04/02/2021   Allergic rhinitis due to pollen 06/22/2020   Eosinophilic esophagitis 06/22/2020   Rash and other nonspecific skin eruption 06/22/2020   AC joint arthropathy 01/04/2017   Acute pain of right shoulder 11/15/2016   Other derangements of  patella, unspecified knee 09/02/2015   Recurrent dislocation of right patella 09/02/2015   Breast hypertrophy in female 05/13/2015   Neuroma of lower extremity 02/11/2011    Class: Chronic    PCP: [name], PA-C   REFERRING PROVIDER: Arnie Lao, MD Next apt: 05/18/23  REFERRING DIAG: Z61.096 (ICD-10-CM) - Status post total left knee replacement M17.12 (ICD-10-CM) - Unilateral primary osteoarthritis, left knee  THERAPY DIAG:  Status post total left knee replacement  Difficulty in walking, not elsewhere classified  Rationale for Evaluation and Treatment: Rehabilitation  ONSET DATE: 05/05/2023  SUBJECTIVE:   SUBJECTIVE STATEMENT: Pt reports she's still doing her HEP, minimal pain and is working mostly on bending it.   Eval:  Pt reports extended stay in hospital following sx due to BP changes and having a temperature. Pt reports being independent with all household and community ambulation with no need for AD but was in significant amount of pain. Pt would like to be able to decrease the pain level and return to being able to "get up and go." Pt reports taking compression sleeve off this morning due to pain.  PERTINENT HISTORY: Surgery was a week ago today, 05/05/23. Pt has hx of R TKA 2 years ago.  PAIN:  Are you having pain? Yes: NPRS scale: 3/10 Pain location: left knee all around Pain description: dull  Aggravating factors: walking, standing Relieving factors: sitting, iceman  PRECAUTIONS: Knee  RED FLAGS: None   WEIGHT BEARING RESTRICTIONS: No  FALLS:  Has patient fallen in last 6 months? No  LIVING ENVIRONMENT: Lives with: lives with their spouse and lives with their daughter Lives in: House/apartment Stairs: Yes: External: 1 steps; none Has following equipment at home: Single point cane and Crutches  OCCUPATION: tax office  PLOF: Independent with household mobility without device, Independent with community mobility without device, and  pain with stairs  PATIENT GOALS: get rid of pain, wants to get her life back, walk dogs, wants to get up and go  NEXT MD VISIT: 05/18/23  OBJECTIVE:  Note: Objective measures were completed at Evaluation unless otherwise noted.  DIAGNOSTIC FINDINGS:  Radiographs 3/21: CLINICAL DATA:  Status post total left knee arthroplasty.   EXAM: PORTABLE LEFT KNEE - 1-2 VIEW   COMPARISON:  Left knee radiographs 01/09/2023   FINDINGS: Interval total left knee arthroplasty. No perihardware lucency is seen to indicate hardware failure or loosening. Expected postoperative changes including intra-articular and subcutaneous air. Smalljoint effusion. Anterior surgical skin staples. No acute fracture or dislocation.   IMPRESSION: Interval total left knee arthroplasty without evidence of hardware failure.  PATIENT SURVEYS:  LEFS 30/80 = 37.5%  COGNITION: Overall cognitive status: Within functional limits for tasks assessed     SENSATION: [facility] Not tested  EDEMA:  Pt demonstrates increased swelling in LLE with pt reporting, she took compression sleeve off this morning due to pain. Pt denies throbbing pain and is educated on the risk of DVT and importance of inflammation control.  PALPATION: Pain/tenderness/soreness reported  during palpation of L knee and mid calf region.   LOWER EXTREMITY ROM:  Active ROM Right eval Left eval Left 05/23/23 Left 06/09/23 Left 06/13/23 Left 06/22/2023 Left 06/28/23  Hip flexion         Hip extension         Hip abduction         Hip adduction         Hip internal rotation         Hip external rotation         Knee flexion 109 60 77 98  94 105 AAROM  Knee extension 4 15 -10 (lacking) -2 AAROM  -3 -7, 0 AAROM  Ankle dorsiflexion         Ankle plantarflexion         Ankle inversion         Ankle eversion          (Blank rows = not tested)  LOWER EXTREMITY MMT: not formally assessed upon eval due to pain  MMT Right eval Left eval  Hip flexion WFL    Hip extension [facility]   Hip abduction    Hip adduction    Hip internal rotation    Hip external rotation    Knee flexion WFL   Knee extension WFL   Ankle dorsiflexion WFL   Ankle plantarflexion WFL   Ankle inversion    Ankle eversion     (Blank rows = not tested)   FUNCTIONAL TESTS:  5 times sit to stand: TBA 2 minute walk test: 226 ft with RW with slow cadence, decreased stride length and step length bilaterally, antalgic  30 second sit to stand 7 times in split stance with UE assistance from chair  06/22/2023   30 Second chair stand test: 12x : 434ft GAIT: Distance walked: 100 Assistive device utilized: Environmental consultant - 2 wheeled Level of assistance: Modified independence Comments: decreased speed, decreased stride length and step length bilaterally, antalgic                                                                                                                                TREATMENT DATE:  07/05/23 TE - Recumbent bike rocking with close seat and min cuing for maintaining functional form appropriateness - Supine ball roll up w/ end range flexion emphasis + bridge on full extension with TKE - 20 repetitions - Step stretch into flexion as well as end range extension with applied pressure on thigh TA Transfer training - Squat to stand  ~10 lowered mat table (L LE posterior staggered stance) with full sit to stand  ~10 with yellow weighted ball and tap to mat table (staggered L LE posterior)  ~10 w/ green swiss ball on back on wall without holding ball  ~10 w/ green swiss ball on back on wall with holding yellow weighted ball Manual - Flexion mobs; STM and manual flexion to extension in supine w/ reduction  of compensatory hip hike and improved tolerance throughout with end range  06/28/23 STM to left knee followed by manual ROM to improve left knee mobility and decrease soft tissue tightness x 15' Bike seat 6' x 5' full revolutions Heel raises 2 x 10 Slant board 5 x  20" Knee flexion drives on 12" box x 2\' 12"  box hamstring stretch 10" x 2' Squats x 10   06/22/2023  -Recumbent bike x 7' -ROM -STM x 8' with prolonged flexion hold over therapist arm in hooklying position -LEFS 60/80= 75% -30 Second Chair Stand test -  06/20/2023  -Active mobility on recumbent bike x 8' with cues for ankle DF and limited hip hiking knee mobility-10'' second in place of "stretch" -Manual therapy-contract/relax of prone knee flexion 10 for 10'' -Manual therapy-active knee extension for 5 with 10'' passive manual applied stretch into extension -seated isolated L knee flexion on treadmill x 2'' -BLE pushing on treadmill backwards x 2'' with BUE support  06/16/23 Supine STM to left knee followed by manual ROM for flexion and extension to improve left knee joint mobility x 22' Sitting manual ROM for left knee flexion x 10 reps Bike seat 6 x half revolutions today x 12' for mobility     PATIENT EDUCATION:  Education details: Pt was educated on findings of PT evaluation, prognosis, frequency of therapy visits and rationale, attendance policy, and HEP if given.   Person educated: Patient Education method: Explanation Education comprehension: verbalized understanding and needs further education  HOME EXERCISE PROGRAM: Access Code: [email or web address] URL: [email or web address] Date: 05/12/2023 Prepared by: Armond Bertin  Exercises - Supine Ankle Pumps - 3 x daily - 1 sets - 15-20 reps - Supine Hip Abduction - 3 x daily - 1 sets - 15-20 reps - Supine Quad Set - 3 x daily - 1 sets - 15-20 reps - 3 sec hold - Supine Heel Slide with Strap - 3 x daily - 3 sets - 15-20 reps - 3 sec hold   - Prone Knee Extension Hang  - 2 x daily - 7 x weekly - 1 sets - 2 reps - 1'+ hold - Prone Quadriceps Stretch with Strap  - 2 x daily - 7 x weekly - 1 sets - 3 reps - 30" hold - Supine Knee Extension Mobilization with Weight  - 2 x daily - 7 x weekly - 1 sets - 1'+  hold  ASSESSMENT:  CLINICAL IMPRESSION: Today's session emphasized functional flexion through transfer training, TE with recumbent bike rocking, and manual to improve end range tolerance. Demonstrates quality gait with cues and improved functional flexion with step stretch requiring cues for end range  Patient will benefit from continued skilled therapy services to address deficits and promote return to optimal function.       Eval:Patient is a 53 y.o. female who was seen today for physical therapy evaluation and treatment for status post Left TKA, 05/05/2023. Pt demonstrates limited left knee ROM and strength, limited by pain. Pt demonstrates difficulty with ambulation and requires RW. Pt demonstrates decreased gait speed and efficiency due to pain and RW height, RW was adjusted and pt was educated on DME. Patient would continue to benefit from skilled physical therapy to increase pain free L knee ROM, increased LLE strength for improved quality of life, community ambulation and continued progress towards therapy goals.   OBJECTIVE IMPAIRMENTS: Abnormal gait, decreased activity tolerance, decreased balance, decreased coordination, decreased knowledge of use of DME, decreased mobility, difficulty walking, decreased ROM, decreased  strength, increased edema, and pain.   ACTIVITY LIMITATIONS: carrying, bending, standing, squatting, sleeping, stairs, and bed mobility  PARTICIPATION LIMITATIONS: meal prep, cleaning, laundry, shopping, community activity, occupation, and yard work  PERSONAL FACTORS: Age, Past/current experiences, and 1 comorbidity: had to stay a bit longer in the hospital following sx due to temperature and BP changes are also affecting patient's functional outcome.   REHAB POTENTIAL: Good  CLINICAL DECISION MAKING: Stable/uncomplicated  EVALUATION COMPLEXITY: Low   GOALS: Goals reviewed with patient? No  SHORT TERM GOALS: Target date:07/06/23  Patient will demonstrate  evidence of independence with individualized HEP and will report compliance for at least 3 days per week for optimized progression towards remaining therapy goals. Baseline: following post op exercises Goal status: IN PROGRESS  2.  Patient will report a decrease in pain level during community ambulation by at least 2 points for improved quality of life. Baseline: 4/10 Goal status: IN PROGRESS     LONG TERM GOALS: Target date: 07/20/23  Pt will demonstrate a an increase of at least 9 points on the LEFS for improved performance of community ambulation and ADL. Baseline: TBA next session Goal status: MET  2.  Pt will improve 2 MWT to at least 578 feet in order to demonstrate age norms for functional ambulatory capacity in community setting.  Baseline: TBA as pt tolerates post op soreness Goal status: IN PROGRESS  3.  Pt will demonstrate WFL ROM (flexion and extension) in left knee, for increased mobility and maximal efficiency of gait cycle during ambulation. Baseline: see objective Goal status: IN PROGRESS  4.  Pt will demonstrate at least 4-/5 MMT for left lower extremity for increased strength during ADL and community ambulation. Baseline: TBA when pain reduces Goal status: IN PROGRESS     PLAN:  PT FREQUENCY: 2x/week  PT DURATION: 4 weeks  PLANNED INTERVENTIONS: [id number]- PT Re-evaluation, 97750- Physical Performance Testing, 97110-Therapeutic exercises, 97530- Therapeutic activity, [id number]- Neuromuscular re-education, 97535- Self Care, [id number]- Manual therapy, Patient/Family education, Balance training, and Stair training  PLAN FOR NEXT SESSION: Knee mobilty and progress LLE strengthening.  Continue with focus on improving LT knee flexion.   9:01 AM, 07/05/23 Lavaun Porto PT, DPT [facility] [phone] office

## 2023-07-06 ENCOUNTER — Encounter (HOSPITAL_COMMUNITY): Payer: Self-pay

## 2023-07-06 ENCOUNTER — Ambulatory Visit (HOSPITAL_COMMUNITY)

## 2023-07-06 DIAGNOSIS — Z96652 Presence of left artificial knee joint: Secondary | ICD-10-CM | POA: Diagnosis not present

## 2023-07-06 DIAGNOSIS — R262 Difficulty in walking, not elsewhere classified: Secondary | ICD-10-CM

## 2023-07-06 DIAGNOSIS — M25562 Pain in left knee: Secondary | ICD-10-CM | POA: Diagnosis not present

## 2023-07-06 DIAGNOSIS — G8918 Other acute postprocedural pain: Secondary | ICD-10-CM | POA: Diagnosis not present

## 2023-07-06 DIAGNOSIS — Z96651 Presence of right artificial knee joint: Secondary | ICD-10-CM | POA: Diagnosis not present

## 2023-07-06 NOTE — Therapy (Signed)
 OUTPATIENT PHYSICAL THERAPY LOWER EXTREMITY TREATMENT    Patient Name: Carrie [name] MRN: [id number] DOB:1970-09-05, 53 y.o., female Today's Date: 07/06/2023  END OF SESSION:  PT End of Session - 07/06/23 0756     Visit Number 15    Number of Visits 20    Date for PT Re-Evaluation 07/20/23    Authorization Type [employer], no auth required    Progress Note Due on Visit 20    PT Start Time 0800    PT Stop Time 0840    PT Time Calculation (min) 40 min    Activity Tolerance Patient tolerated treatment well    Behavior During Therapy [facility] for tasks assessed/performed            Past Medical History:  Diagnosis Date   Arthritis    Arthrofibrosis of right total knee arthroplasty, subsequent encounter 05/20/2021   Diabetes mellitus without complication (HCC)    type 2   Essential hypertension    GERD (gastroesophageal reflux disease)    Headache    migraine   Hypothyroidism    Mixed hyperlipidemia    PONV (postoperative nausea and vomiting)    Really Sick!!! Had to stay in the hospital   Past Surgical History:  Procedure Laterality Date   ABDOMINAL HYSTERECTOMY     BIOPSY  04/25/2022   Procedure: BIOPSY;  Surgeon: Suzette Espy, MD;  Location: [facility];  Service: Endoscopy;;   COLONOSCOPY WITH PROPOFOL  N/A 04/25/2022   Procedure: COLONOSCOPY WITH PROPOFOL ;  Surgeon: Suzette Espy, MD;  Location: [facility];  Service: Endoscopy;  Laterality: N/A;  8:45 am, asa 2   ESOPHAGOGASTRODUODENOSCOPY (EGD) WITH PROPOFOL  N/A 04/25/2022   Procedure: ESOPHAGOGASTRODUODENOSCOPY (EGD) WITH PROPOFOL ;  Surgeon: Suzette Espy, MD;  [facility]: [facility];  Service: Endoscopy;  Laterality: N/A;   HARDWARE REMOVAL Right 04/02/2021   Procedure: Right Knee HARDWARE REMOVAL;  Surgeon: Arnie Lao, MD;  Location: [facility];  Service: Orthopedics;  Laterality: Right;   KNEE ARTHROSCOPY  02/15/2007   KNEE CLOSED REDUCTION Right 05/20/2021   Procedure: CLOSED MANIPULATION RIGHT  KNEE;  Surgeon: Arnie Lao, MD;  Location: [facility];  Service: Orthopedics;  Laterality: Right;   MALONEY DILATION N/A 04/25/2022   Procedure: Londa Rival DILATION;  Surgeon: Suzette Espy, MD;  Location: [facility];  Service: Endoscopy;  Laterality: N/A;   MASS EXCISION  02/11/2011   Procedure: EXCISION MASS;  Surgeon: Alphonso Jean, MD;  Location: [facility];  Service: Orthopedics;  Laterality: Right;  resection of neuroma right infrapatellar medial knee   PATELLA ARTHROPLASTY     rt knee-   POLYPECTOMY  04/25/2022   Procedure: POLYPECTOMY;  Surgeon: Suzette Espy, MD;  [facility]: [facility];  Service: Endoscopy;;   REDUCTION MAMMAPLASTY Bilateral 2017   TOTAL KNEE ARTHROPLASTY  02/14/2009   partial-[facility]    TOTAL KNEE ARTHROPLASTY Right 04/02/2021   Procedure: Right TOTAL KNEE ARTHROPLASTY;  Surgeon: Arnie Lao, MD;  Location: [facility];  Service: Orthopedics;  Laterality: Right;   TOTAL KNEE ARTHROPLASTY Left 05/05/2023   Procedure: LEFT TOTAL KNEE ARTHROPLASTY;  Surgeon: Arnie Lao, MD;  Location: [facility];  Service: Orthopedics;  Laterality: Left;   TUBAL LIGATION     Patient Active Problem List   Diagnosis Date Noted   Status post total left knee replacement 05/05/2023   Encounter for screening colonoscopy 04/04/2022   Esophageal dysphagia 04/04/2022   Constipation 04/04/2022  Arthrofibrosis of right total knee arthroplasty, subsequent encounter 05/20/2021   Pain in right knee 04/19/2021   Stiffness of right knee, not elsewhere classified 04/19/2021   Difficulty in walking, not elsewhere classified 04/19/2021   Status post total knee replacement, right 04/02/2021   Allergic rhinitis due to pollen 06/22/2020   Eosinophilic esophagitis 06/22/2020   Rash and other nonspecific skin eruption 06/22/2020   AC joint arthropathy 01/04/2017   Acute pain of right shoulder 11/15/2016   Other derangements of  patella, unspecified knee 09/02/2015   Recurrent dislocation of right patella 09/02/2015   Breast hypertrophy in female 05/13/2015   Neuroma of lower extremity 02/11/2011    Class: Chronic    PCP: [name], PA-C   REFERRING PROVIDER: Arnie Lao, MD Next apt: 05/18/23  REFERRING DIAG: Z61.096 (ICD-10-CM) - Status post total left knee replacement M17.12 (ICD-10-CM) - Unilateral primary osteoarthritis, left knee  THERAPY DIAG:  Status post total left knee replacement  Difficulty in walking, not elsewhere classified  Rationale for Evaluation and Treatment: Rehabilitation  ONSET DATE: 05/05/2023  SUBJECTIVE:   SUBJECTIVE STATEMENT: Pt stated knee is sore this morning, pain scale 2/10.  Eval:  Pt reports extended stay in hospital following sx due to BP changes and having a temperature. Pt reports being independent with all household and community ambulation with no need for AD but was in significant amount of pain. Pt would like to be able to decrease the pain level and return to being able to "get up and go." Pt reports taking compression sleeve off this morning due to pain.  PERTINENT HISTORY: Surgery was a week ago today, 05/05/23. Pt has hx of R TKA 2 years ago.  PAIN:  Are you having pain? Yes: NPRS scale: 2/10 Pain location: left knee all around Pain description: dull  Aggravating factors: walking, standing Relieving factors: sitting, iceman  PRECAUTIONS: Knee  RED FLAGS: None   WEIGHT BEARING RESTRICTIONS: No  FALLS:  Has patient fallen in last 6 months? No  LIVING ENVIRONMENT: Lives with: lives with their spouse and lives with their daughter Lives in: House/apartment Stairs: Yes: External: 1 steps; none Has following equipment at home: Single point cane and Crutches  OCCUPATION: tax office  PLOF: Independent with household mobility without device, Independent with community mobility without device, and pain with stairs  PATIENT GOALS:  get rid of pain, wants to get her life back, walk dogs, wants to get up and go  NEXT MD VISIT: 05/18/23  OBJECTIVE:  Note: Objective measures were completed at Evaluation unless otherwise noted.  DIAGNOSTIC FINDINGS:  Radiographs 3/21: CLINICAL DATA:  Status post total left knee arthroplasty.   EXAM: PORTABLE LEFT KNEE - 1-2 VIEW   COMPARISON:  Left knee radiographs 01/09/2023   FINDINGS: Interval total left knee arthroplasty. No perihardware lucency is seen to indicate hardware failure or loosening. Expected postoperative changes including intra-articular and subcutaneous air. Smalljoint effusion. Anterior surgical skin staples. No acute fracture or dislocation.   IMPRESSION: Interval total left knee arthroplasty without evidence of hardware failure.  PATIENT SURVEYS:  LEFS 30/80 = 37.5%  COGNITION: Overall cognitive status: Within functional limits for tasks assessed     SENSATION: [facility] Not tested  EDEMA:  Pt demonstrates increased swelling in LLE with pt reporting, she took compression sleeve off this morning due to pain. Pt denies throbbing pain and is educated on the risk of DVT and importance of inflammation control.  PALPATION: Pain/tenderness/soreness reported during palpation of L knee and mid  calf region.   LOWER EXTREMITY ROM:  Active ROM Right eval Left eval Left 05/23/23 Left 06/09/23 Left 06/13/23 Left 06/22/2023 Left 06/28/23 Left 07/06/23  Hip flexion          Hip extension          Hip abduction          Hip adduction          Hip internal rotation          Hip external rotation          Knee flexion 109 60 77 98  94 105 AAROM 110  Knee extension 4 15 -10 (lacking) -2 AAROM  -3 -7, 0 AAROM 6 lacking, AAROM 0  Ankle dorsiflexion          Ankle plantarflexion          Ankle inversion          Ankle eversion           (Blank rows = not tested)  LOWER EXTREMITY MMT: not formally assessed upon eval due to pain  MMT Right eval Left eval  Hip  flexion WFL   Hip extension [facility]   Hip abduction    Hip adduction    Hip internal rotation    Hip external rotation    Knee flexion WFL   Knee extension WFL   Ankle dorsiflexion WFL   Ankle plantarflexion WFL   Ankle inversion    Ankle eversion     (Blank rows = not tested)   FUNCTIONAL TESTS:  5 times sit to stand: TBA 2 minute walk test: 226 ft with RW with slow cadence, decreased stride length and step length bilaterally, antalgic  30 second sit to stand 7 times in split stance with UE assistance from chair  06/22/2023   30 Second chair stand test: 12x : 412ft GAIT: Distance walked: 100 Assistive device utilized: Environmental consultant - 2 wheeled Level of assistance: Modified independence Comments: decreased speed, decreased stride length and step length bilaterally, antalgic                                                                                                                                TREATMENT DATE:  07/06/23: - Recumbent bike seat 5 to seat 3 5' Standing: - Posterior lunge onto foam for flexion - Knee drives on 12in step 5 x 10" ~10 w/ green swiss ball on back on wall with holding yellow weighted ball Supine: - Supine ball roll up w/ end range flexion emphasis + bridge on full extension with TKE - 20 repetitions - Manual: PROM, STM, patella mobs all direction - AROM 6-110 degrees (AAROM 0-112 degrees) Prone: - Contract/relax 5x 10" for flexion - quad stretch 3x 30"    07/05/23 TE - Recumbent bike rocking with close seat and min cuing for maintaining functional form appropriateness - Supine ball roll up w/ end range flexion emphasis + bridge on  full extension with TKE - 20 repetitions - Step stretch into flexion as well as end range extension with applied pressure on thigh TA Transfer training - Squat to stand  ~10 lowered mat table (L LE posterior staggered stance) with full sit to stand  ~10 with yellow weighted ball and tap to mat table (staggered L LE  posterior)  ~10 w/ green swiss ball on back on wall without holding ball  ~10 w/ green swiss ball on back on wall with holding yellow weighted ball Manual - Flexion mobs; STM and manual flexion to extension in supine w/ reduction of compensatory hip hike and improved tolerance throughout with end range  06/28/23 STM to left knee followed by manual ROM to improve left knee mobility and decrease soft tissue tightness x 15' Bike seat 6' x 5' full revolutions Heel raises 2 x 10 Slant board 5 x 20" Knee flexion drives on 12" box x 2\' 12"  box hamstring stretch 10" x 2' Squats x 10   06/22/2023  -Recumbent bike x 7' -ROM -STM x 8' with prolonged flexion hold over therapist arm in hooklying position -LEFS 60/80= 75% -30 Second Chair Stand test -  06/20/2023  -Active mobility on recumbent bike x 8' with cues for ankle DF and limited hip hiking knee mobility-10'' second in place of "stretch" -Manual therapy-contract/relax of prone knee flexion 10 for 10'' -Manual therapy-active knee extension for 5 with 10'' passive manual applied stretch into extension -seated isolated L knee flexion on treadmill x 2'' -BLE pushing on treadmill backwards x 2'' with BUE support  06/16/23 Supine STM to left knee followed by manual ROM for flexion and extension to improve left knee joint mobility x 22' Sitting manual ROM for left knee flexion x 10 reps Bike seat 6 x half revolutions today x 12' for mobility     PATIENT EDUCATION:  Education details: Pt was educated on findings of PT evaluation, prognosis, frequency of therapy visits and rationale, attendance policy, and HEP if given.   Person educated: Patient Education method: Explanation Education comprehension: verbalized understanding and needs further education  HOME EXERCISE PROGRAM: Access Code: [email or web address] URL: [email or web address] Date: 05/12/2023 Prepared by: Armond Bertin  Exercises - Supine Ankle Pumps - 3 x daily - 1 sets  - 15-20 reps - Supine Hip Abduction - 3 x daily - 1 sets - 15-20 reps - Supine Quad Set - 3 x daily - 1 sets - 15-20 reps - 3 sec hold - Supine Heel Slide with Strap - 3 x daily - 3 sets - 15-20 reps - 3 sec hold   - Prone Knee Extension Hang  - 2 x daily - 7 x weekly - 1 sets - 2 reps - 1'+ hold - Prone Quadriceps Stretch with Strap  - 2 x daily - 7 x weekly - 1 sets - 3 reps - 30" hold - Supine Knee Extension Mobilization with Weight  - 2 x daily - 7 x weekly - 1 sets - 1'+ hold  ASSESSMENT:  CLINICAL IMPRESSION: Today's session emphasized functional flexion through transfer training, TE with recumbent bike rocking, and manual to improve end range tolerance. Demonstrates quality gait with cues and improved functional flexion with step stretch requiring cues for end range  Patient will benefit from continued skilled therapy services to address deficits and promote return to optimal function.     Continued session focus with knee mobility and functional strengthening.  Added posterior lunges for knee flexion and posterior chain strengthening.  Pt making good gains with ability to scoot chair on bike from 5 to 3 to improve flexion.  Manual STM to address musculature restrictions, PROM and patella mobs to assist with mobility.  AROM 6-110 (AAROM 0-112).  Pt tolerated well to session.    Eval:Patient is a 53 y.o. female who was seen today for physical therapy evaluation and treatment for status post Left TKA, 05/05/2023. Pt demonstrates limited left knee ROM and strength, limited by pain. Pt demonstrates difficulty with ambulation and requires RW. Pt demonstrates decreased gait speed and efficiency due to pain and RW height, RW was adjusted and pt was educated on DME. Patient would continue to benefit from skilled physical therapy to increase pain free L knee ROM, increased LLE strength for improved quality of life, community ambulation and continued progress towards therapy goals.   OBJECTIVE  IMPAIRMENTS: Abnormal gait, decreased activity tolerance, decreased balance, decreased coordination, decreased knowledge of use of DME, decreased mobility, difficulty walking, decreased ROM, decreased strength, increased edema, and pain.   ACTIVITY LIMITATIONS: carrying, bending, standing, squatting, sleeping, stairs, and bed mobility  PARTICIPATION LIMITATIONS: meal prep, cleaning, laundry, shopping, community activity, occupation, and yard work  PERSONAL FACTORS: Age, Past/current experiences, and 1 comorbidity: had to stay a bit longer in the hospital following sx due to temperature and BP changes are also affecting patient's functional outcome.   REHAB POTENTIAL: Good  CLINICAL DECISION MAKING: Stable/uncomplicated  EVALUATION COMPLEXITY: Low   GOALS: Goals reviewed with patient? No  SHORT TERM GOALS: Target date:07/06/23  Patient will demonstrate evidence of independence with individualized HEP and will report compliance for at least 3 days per week for optimized progression towards remaining therapy goals. Baseline: following post op exercises Goal status: IN PROGRESS  2.  Patient will report a decrease in pain level during community ambulation by at least 2 points for improved quality of life. Baseline: 4/10 Goal status: IN PROGRESS     LONG TERM GOALS: Target date: 07/20/23  Pt will demonstrate a an increase of at least 9 points on the LEFS for improved performance of community ambulation and ADL. Baseline: TBA next session Goal status: MET  2.  Pt will improve 2 MWT to at least 578 feet in order to demonstrate age norms for functional ambulatory capacity in community setting.  Baseline: TBA as pt tolerates post op soreness Goal status: IN PROGRESS  3.  Pt will demonstrate WFL ROM (flexion and extension) in left knee, for increased mobility and maximal efficiency of gait cycle during ambulation. Baseline: see objective Goal status: IN PROGRESS  4.  Pt will demonstrate  at least 4-/5 MMT for left lower extremity for increased strength during ADL and community ambulation. Baseline: TBA when pain reduces Goal status: IN PROGRESS     PLAN:  PT FREQUENCY: 2x/week  PT DURATION: 4 weeks  PLANNED INTERVENTIONS: [id number]- PT Re-evaluation, 97750- Physical Performance Testing, 97110-Therapeutic exercises, 97530- Therapeutic activity, [id number]- Neuromuscular re-education, 97535- Self Care, 62130- Manual therapy, Patient/Family education, Balance training, and Stair training  PLAN FOR NEXT SESSION: Knee mobilty and progress LLE strengthening.  Continue with focus on improving LT knee flexion.   Minor Amble, LPTA/CLT; CBIS [phone]  9:44 AM, 07/06/23

## 2023-07-12 ENCOUNTER — Encounter (HOSPITAL_COMMUNITY): Payer: Self-pay

## 2023-07-12 ENCOUNTER — Ambulatory Visit (HOSPITAL_COMMUNITY)

## 2023-07-12 DIAGNOSIS — Z96652 Presence of left artificial knee joint: Secondary | ICD-10-CM

## 2023-07-12 DIAGNOSIS — M25562 Pain in left knee: Secondary | ICD-10-CM | POA: Diagnosis not present

## 2023-07-12 DIAGNOSIS — R262 Difficulty in walking, not elsewhere classified: Secondary | ICD-10-CM

## 2023-07-12 DIAGNOSIS — G8918 Other acute postprocedural pain: Secondary | ICD-10-CM | POA: Diagnosis not present

## 2023-07-12 DIAGNOSIS — Z96651 Presence of right artificial knee joint: Secondary | ICD-10-CM | POA: Diagnosis not present

## 2023-07-12 NOTE — Therapy (Signed)
 OUTPATIENT PHYSICAL THERAPY LOWER EXTREMITY TREATMENT    Patient Name: Carrie [name] MRN: [id number] DOB:1970/06/22, 53 y.o., female Today's Date: 07/12/2023  END OF SESSION:  PT End of Session - 07/12/23 0845     Visit Number 16    Number of Visits 20    Date for PT Re-Evaluation 07/20/23    Authorization Type [employer], no auth required    Progress Note Due on Visit 20    PT Start Time 0847    PT Stop Time 0928    PT Time Calculation (min) 41 min    Activity Tolerance Patient tolerated treatment well    Behavior During Therapy [facility] for tasks assessed/performed            Past Medical History:  Diagnosis Date   Arthritis    Arthrofibrosis of right total knee arthroplasty, subsequent encounter 05/20/2021   Diabetes mellitus without complication (HCC)    type 2   Essential hypertension    GERD (gastroesophageal reflux disease)    Headache    migraine   Hypothyroidism    Mixed hyperlipidemia    PONV (postoperative nausea and vomiting)    Really Sick!!! Had to stay in the hospital   Past Surgical History:  Procedure Laterality Date   ABDOMINAL HYSTERECTOMY     BIOPSY  04/25/2022   Procedure: BIOPSY;  Surgeon: Suzette Espy, MD;  [facility]: [facility];  Service: Endoscopy;;   COLONOSCOPY WITH PROPOFOL  N/A 04/25/2022   Procedure: COLONOSCOPY WITH PROPOFOL ;  Surgeon: Suzette Espy, MD;  Location: [facility];  Service: Endoscopy;  Laterality: N/A;  8:45 am, asa 2   ESOPHAGOGASTRODUODENOSCOPY (EGD) WITH PROPOFOL  N/A 04/25/2022   Procedure: ESOPHAGOGASTRODUODENOSCOPY (EGD) WITH PROPOFOL ;  Surgeon: Suzette Espy, MD;  [facility]: [facility];  Service: Endoscopy;  Laterality: N/A;   HARDWARE REMOVAL Right 04/02/2021   Procedure: Right Knee HARDWARE REMOVAL;  Surgeon: Arnie Lao, MD;  Location: [facility];  Service: Orthopedics;  Laterality: Right;   KNEE ARTHROSCOPY  02/15/2007   KNEE CLOSED REDUCTION Right 05/20/2021   Procedure: CLOSED MANIPULATION RIGHT  KNEE;  Surgeon: Arnie Lao, MD;  Location: [facility];  Service: Orthopedics;  Laterality: Right;   MALONEY DILATION N/A 04/25/2022   Procedure: Londa Rival DILATION;  Surgeon: Suzette Espy, MD;  Location: [facility];  Service: Endoscopy;  Laterality: N/A;   MASS EXCISION  02/11/2011   Procedure: EXCISION MASS;  Surgeon: Alphonso Jean, MD;  Location: [facility];  Service: Orthopedics;  Laterality: Right;  resection of neuroma right infrapatellar medial knee   PATELLA ARTHROPLASTY     rt knee-   POLYPECTOMY  04/25/2022   Procedure: POLYPECTOMY;  Surgeon: Suzette Espy, MD;  [facility]: [facility];  Service: Endoscopy;;   REDUCTION MAMMAPLASTY Bilateral 2017   TOTAL KNEE ARTHROPLASTY  02/14/2009   partial-[place] [facility]    TOTAL KNEE ARTHROPLASTY Right 04/02/2021   Procedure: Right TOTAL KNEE ARTHROPLASTY;  Surgeon: Arnie Lao, MD;  Location: [facility];  Service: Orthopedics;  Laterality: Right;   TOTAL KNEE ARTHROPLASTY Left 05/05/2023   Procedure: LEFT TOTAL KNEE ARTHROPLASTY;  Surgeon: Arnie Lao, MD;  Location: [facility];  Service: Orthopedics;  Laterality: Left;   TUBAL LIGATION     Patient Active Problem List   Diagnosis Date Noted   Status post total left knee replacement 05/05/2023   Encounter for screening colonoscopy 04/04/2022   Esophageal dysphagia 04/04/2022   Constipation 04/04/2022  Arthrofibrosis of right total knee arthroplasty, subsequent encounter 05/20/2021   Pain in right knee 04/19/2021   Stiffness of right knee, not elsewhere classified 04/19/2021   Difficulty in walking, not elsewhere classified 04/19/2021   Status post total knee replacement, right 04/02/2021   Allergic rhinitis due to pollen 06/22/2020   Eosinophilic esophagitis 06/22/2020   Rash and other nonspecific skin eruption 06/22/2020   AC joint arthropathy 01/04/2017   Acute pain of right shoulder 11/15/2016   Other derangements of  patella, unspecified knee 09/02/2015   Recurrent dislocation of right patella 09/02/2015   Breast hypertrophy in female 05/13/2015   Neuroma of lower extremity 02/11/2011    Class: Chronic    PCP: [name], PA-C   REFERRING PROVIDER: Arnie Lao, MD Next apt: 05/18/23  REFERRING DIAG: E45.409 (ICD-10-CM) - Status post total left knee replacement M17.12 (ICD-10-CM) - Unilateral primary osteoarthritis, left knee  THERAPY DIAG:  Status post total left knee replacement  Difficulty in walking, not elsewhere classified  Rationale for Evaluation and Treatment: Rehabilitation  ONSET DATE: 05/05/2023  SUBJECTIVE:   SUBJECTIVE STATEMENT: Reports knee was sore following last session.  Feeling good today, no reports of pain currently.    Eval:  Pt reports extended stay in hospital following sx due to BP changes and having a temperature. Pt reports being independent with all household and community ambulation with no need for AD but was in significant amount of pain. Pt would like to be able to decrease the pain level and return to being able to "get up and go." Pt reports taking compression sleeve off this morning due to pain.  PERTINENT HISTORY: Surgery was a week ago today, 05/05/23. Pt has hx of R TKA 2 years ago.  PAIN:  Are you having pain? Yes: NPRS scale: 2/10 Pain location: left knee all around Pain description: dull  Aggravating factors: walking, standing Relieving factors: sitting, iceman  PRECAUTIONS: Knee  RED FLAGS: None   WEIGHT BEARING RESTRICTIONS: No  FALLS:  Has patient fallen in last 6 months? No  LIVING ENVIRONMENT: Lives with: lives with their spouse and lives with their daughter Lives in: House/apartment Stairs: Yes: External: 1 steps; none Has following equipment at home: Single point cane and Crutches  OCCUPATION: tax office  PLOF: Independent with household mobility without device, Independent with community mobility without  device, and pain with stairs  PATIENT GOALS: get rid of pain, wants to get her life back, walk dogs, wants to get up and go  NEXT MD VISIT: 05/18/23  OBJECTIVE:  Note: Objective measures were completed at Evaluation unless otherwise noted.  DIAGNOSTIC FINDINGS:  Radiographs 3/21: CLINICAL DATA:  Status post total left knee arthroplasty.   EXAM: PORTABLE LEFT KNEE - 1-2 VIEW   COMPARISON:  Left knee radiographs 01/09/2023   FINDINGS: Interval total left knee arthroplasty. No perihardware lucency is seen to indicate hardware failure or loosening. Expected postoperative changes including intra-articular and subcutaneous air. Smalljoint effusion. Anterior surgical skin staples. No acute fracture or dislocation.   IMPRESSION: Interval total left knee arthroplasty without evidence of hardware failure.  PATIENT SURVEYS:  LEFS 30/80 = 37.5%  COGNITION: Overall cognitive status: Within functional limits for tasks assessed     SENSATION: [facility] Not tested  EDEMA:  Pt demonstrates increased swelling in LLE with pt reporting, she took compression sleeve off this morning due to pain. Pt denies throbbing pain and is educated on the risk of DVT and importance of inflammation control.  PALPATION: Pain/tenderness/soreness  reported during palpation of L knee and mid calf region.   LOWER EXTREMITY ROM:  Active ROM Right eval Left eval Left 05/23/23 Left 06/09/23 Left 06/13/23 Left 06/22/2023 Left 06/28/23 Left 07/06/23  Hip flexion          Hip extension          Hip abduction          Hip adduction          Hip internal rotation          Hip external rotation          Knee flexion 109 60 77 98  94 105 AAROM 110  Knee extension 4 15 -10 (lacking) -2 AAROM  -3 -7, 0 AAROM 6 lacking, AAROM 0  Ankle dorsiflexion          Ankle plantarflexion          Ankle inversion          Ankle eversion           (Blank rows = not tested)  LOWER EXTREMITY MMT: not formally assessed upon eval due  to pain  MMT Right eval Left eval  Hip flexion WFL   Hip extension [facility]   Hip abduction    Hip adduction    Hip internal rotation    Hip external rotation    Knee flexion WFL   Knee extension WFL   Ankle dorsiflexion WFL   Ankle plantarflexion WFL   Ankle inversion    Ankle eversion     (Blank rows = not tested)   FUNCTIONAL TESTS:  5 times sit to stand: TBA 2 minute walk test: 226 ft with RW with slow cadence, decreased stride length and step length bilaterally, antalgic  30 second sit to stand 7 times in split stance with UE assistance from chair  06/22/2023   30 Second chair stand test: 12x : 429ft GAIT: Distance walked: 100 Assistive device utilized: Environmental consultant - 2 wheeled Level of assistance: Modified independence Comments: decreased speed, decreased stride length and step length bilaterally, antalgic                                                                                                                                TREATMENT DATE:  07/12/23 - Recumbent bike seat 5 to seat 3 5' Standing: - Knee drives on 12in step 5 x 10" - squat front of chair 15x - TKE with ball against wall 10x 5" Supine: - Supine ball roll up w/ end range flexion emphasis + bridge on full extension with TKE - 20 repetitions - Manual: PROM, STM, patella mobs all direction - AROM 6-117 degrees 414ft WNL gait mechanics (couple obstacles with pauses during testing) -Scoot scoot 2RT  07/06/23: - Recumbent bike seat 5 to seat 3 5' Standing: - Posterior lunge onto foam for flexion - Knee drives on 12in step 5 x 10" ~10 w/ green swiss ball  on back on wall with holding yellow weighted ball Supine: - Supine ball roll up w/ end range flexion emphasis + bridge on full extension with TKE - 20 repetitions - Manual: PROM, STM, patella mobs all direction - AROM 6-110 degrees (AAROM 0-112 degrees) Prone: - Contract/relax 5x 10" for flexion - quad stretch 3x 30"    07/05/23 TE -  Recumbent bike rocking with close seat and min cuing for maintaining functional form appropriateness - Supine ball roll up w/ end range flexion emphasis + bridge on full extension with TKE - 20 repetitions - Step stretch into flexion as well as end range extension with applied pressure on thigh TA Transfer training - Squat to stand  ~10 lowered mat table (L LE posterior staggered stance) with full sit to stand  ~10 with yellow weighted ball and tap to mat table (staggered L LE posterior)  ~10 w/ green swiss ball on back on wall without holding ball  ~10 w/ green swiss ball on back on wall with holding yellow weighted ball Manual - Flexion mobs; STM and manual flexion to extension in supine w/ reduction of compensatory hip hike and improved tolerance throughout with end range  06/28/23 STM to left knee followed by manual ROM to improve left knee mobility and decrease soft tissue tightness x 15' Bike seat 6' x 5' full revolutions Heel raises 2 x 10 Slant board 5 x 20" Knee flexion drives on 12" box x 2\' 12"  box hamstring stretch 10" x 2' Squats x 10   06/22/2023  -Recumbent bike x 7' -ROM -STM x 8' with prolonged flexion hold over therapist arm in hooklying position -LEFS 60/80= 75% -30 Second Chair Stand test -  06/20/2023  -Active mobility on recumbent bike x 8' with cues for ankle DF and limited hip hiking knee mobility-10'' second in place of "stretch" -Manual therapy-contract/relax of prone knee flexion 10 for 10'' -Manual therapy-active knee extension for 5 with 10'' passive manual applied stretch into extension -seated isolated L knee flexion on treadmill x 2'' -BLE pushing on treadmill backwards x 2'' with BUE support  06/16/23 Supine STM to left knee followed by manual ROM for flexion and extension to improve left knee joint mobility x 22' Sitting manual ROM for left knee flexion x 10 reps Bike seat 6 x half revolutions today x 12' for mobility     PATIENT EDUCATION:   Education details: Pt was educated on findings of PT evaluation, prognosis, frequency of therapy visits and rationale, attendance policy, and HEP if given.   Person educated: Patient Education method: Explanation Education comprehension: verbalized understanding and needs further education  HOME EXERCISE PROGRAM: Access Code: [email or web address] URL: [email or web address] Date: 05/12/2023 Prepared by: Armond Bertin  Exercises - Supine Ankle Pumps - 3 x daily - 1 sets - 15-20 reps - Supine Hip Abduction - 3 x daily - 1 sets - 15-20 reps - Supine Quad Set - 3 x daily - 1 sets - 15-20 reps - 3 sec hold - Supine Heel Slide with Strap - 3 x daily - 3 sets - 15-20 reps - 3 sec hold   - Prone Knee Extension Hang  - 2 x daily - 7 x weekly - 1 sets - 2 reps - 1'+ hold - Prone Quadriceps Stretch with Strap  - 2 x daily - 7 x weekly - 1 sets - 3 reps - 30" hold - Supine Knee Extension Mobilization with Weight  - 2 x daily - 7 x weekly -  1 sets - 1'+ hold  ASSESSMENT:  CLINICAL IMPRESSION: Continued session focus with knee mobility, proximal and functional strengthening.  Pt making great gains with improved knee flexion to 117 degrees (was 110 last session.)  Pt continued to have extension lag in knee, added TKE to HEP.  Pt able to complete all exercises with no reports of increased pain.  Pt does report some hip pain following knee surgerys, complete to assess gait with mechanics WNLs.  Pt with MD apt tomorrow, plan to review goals.  Eval:Patient is a 53 y.o. female who was seen today for physical therapy evaluation and treatment for status post Left TKA, 05/05/2023. Pt demonstrates limited left knee ROM and strength, limited by pain. Pt demonstrates difficulty with ambulation and requires RW. Pt demonstrates decreased gait speed and efficiency due to pain and RW height, RW was adjusted and pt was educated on DME. Patient would continue to benefit from skilled physical therapy to increase  pain free L knee ROM, increased LLE strength for improved quality of life, community ambulation and continued progress towards therapy goals.   OBJECTIVE IMPAIRMENTS: Abnormal gait, decreased activity tolerance, decreased balance, decreased coordination, decreased knowledge of use of DME, decreased mobility, difficulty walking, decreased ROM, decreased strength, increased edema, and pain.   ACTIVITY LIMITATIONS: carrying, bending, standing, squatting, sleeping, stairs, and bed mobility  PARTICIPATION LIMITATIONS: meal prep, cleaning, laundry, shopping, community activity, occupation, and yard work  PERSONAL FACTORS: Age, Past/current experiences, and 1 comorbidity: had to stay a bit longer in the hospital following sx due to temperature and BP changes are also affecting patient's functional outcome.   REHAB POTENTIAL: Good  CLINICAL DECISION MAKING: Stable/uncomplicated  EVALUATION COMPLEXITY: Low   GOALS: Goals reviewed with patient? No  SHORT TERM GOALS: Target date:07/06/23  Patient will demonstrate evidence of independence with individualized HEP and will report compliance for at least 3 days per week for optimized progression towards remaining therapy goals. Baseline: following post op exercises Goal status: IN PROGRESS  2.  Patient will report a decrease in pain level during community ambulation by at least 2 points for improved quality of life. Baseline: 4/10 Goal status: IN PROGRESS     LONG TERM GOALS: Target date: 07/20/23  Pt will demonstrate a an increase of at least 9 points on the LEFS for improved performance of community ambulation and ADL. Baseline: TBA next session Goal status: MET  2.  Pt will improve 2 MWT to at least 578 feet in order to demonstrate age norms for functional ambulatory capacity in community setting.  Baseline: TBA as pt tolerates post op soreness Goal status: IN PROGRESS  3.  Pt will demonstrate WFL ROM (flexion and extension) in left knee,  for increased mobility and maximal efficiency of gait cycle during ambulation. Baseline: see objective Goal status: IN PROGRESS  4.  Pt will demonstrate at least 4-/5 MMT for left lower extremity for increased strength during ADL and community ambulation. Baseline: TBA when pain reduces Goal status: IN PROGRESS     PLAN:  PT FREQUENCY: 2x/week  PT DURATION: 4 weeks  PLANNED INTERVENTIONS: 97164- PT Re-evaluation, 97750- Physical Performance Testing, 97110-Therapeutic exercises, 97530- Therapeutic activity, [id number]- Neuromuscular re-education, 97535- Self Care, [id number]- Manual therapy, Patient/Family education, Balance training, and Stair training  PLAN FOR NEXT SESSION: Knee mobilty and progress LLE strengthening.  Continue with focus on improving LT knee flexion.  MD apt tomorrow, review goals prior apt.  Add steps up for functional strengthening.  Minor Amble, LPTA/CLT; Johnye Napoleon  [phone]  9:31 AM, 07/12/23

## 2023-07-13 ENCOUNTER — Ambulatory Visit: Admitting: Orthopaedic Surgery

## 2023-07-13 ENCOUNTER — Ambulatory Visit (HOSPITAL_COMMUNITY)

## 2023-07-13 ENCOUNTER — Encounter (HOSPITAL_COMMUNITY): Payer: Self-pay

## 2023-07-13 ENCOUNTER — Encounter: Payer: Self-pay | Admitting: Orthopaedic Surgery

## 2023-07-13 ENCOUNTER — Encounter: Payer: Self-pay | Admitting: Gastroenterology

## 2023-07-13 DIAGNOSIS — Z96651 Presence of right artificial knee joint: Secondary | ICD-10-CM | POA: Diagnosis not present

## 2023-07-13 DIAGNOSIS — R262 Difficulty in walking, not elsewhere classified: Secondary | ICD-10-CM | POA: Diagnosis not present

## 2023-07-13 DIAGNOSIS — M25562 Pain in left knee: Secondary | ICD-10-CM | POA: Diagnosis not present

## 2023-07-13 DIAGNOSIS — Z96652 Presence of left artificial knee joint: Secondary | ICD-10-CM | POA: Diagnosis not present

## 2023-07-13 DIAGNOSIS — G8918 Other acute postprocedural pain: Secondary | ICD-10-CM | POA: Diagnosis not present

## 2023-07-13 NOTE — Therapy (Addendum)
 OUTPATIENT PHYSICAL THERAPY LOWER EXTREMITY TREATMENT  Progress Note Reporting Period 05/15/23 to 07/13/23  See note below for Objective Data and Assessment of Progress/Goals.      Patient Name: Carrie [name] MRN: [id number] DOB:Jan 13, 1971, 53 y.o., female Today's Date: 07/13/2023  END OF SESSION:  PT End of Session - 07/13/23 0858     Visit Number 17    Number of Visits 20    Date for PT Re-Evaluation 07/20/23    Authorization Type [employer], no auth required    Progress Note Due on Visit 20    PT Start Time [phone]    PT Stop Time 0930    PT Time Calculation (min) 38 min    Activity Tolerance Patient tolerated treatment well    Behavior During Therapy [facility] for tasks assessed/performed            Past Medical History:  Diagnosis Date   Arthritis    Arthrofibrosis of right total knee arthroplasty, subsequent encounter 05/20/2021   Diabetes mellitus without complication (HCC)    type 2   Essential hypertension    GERD (gastroesophageal reflux disease)    Headache    migraine   Hypothyroidism    Mixed hyperlipidemia    PONV (postoperative nausea and vomiting)    Really Sick!!! Had to stay in the hospital   Past Surgical History:  Procedure Laterality Date   ABDOMINAL HYSTERECTOMY     BIOPSY  04/25/2022   Procedure: BIOPSY;  Surgeon: Suzette Espy, MD;  [facility];  Service: Endoscopy;;   COLONOSCOPY WITH PROPOFOL  N/A 04/25/2022   Procedure: COLONOSCOPY WITH PROPOFOL ;  Surgeon: Suzette Espy, MD;  [facility];  Service: Endoscopy;  Laterality: N/A;  8:45 am, asa 2   ESOPHAGOGASTRODUODENOSCOPY (EGD) WITH PROPOFOL  N/A 04/25/2022   Procedure: ESOPHAGOGASTRODUODENOSCOPY (EGD) WITH PROPOFOL ;  Surgeon: Suzette Espy, MD;  [facility];  Service: Endoscopy;  Laterality: N/A;   HARDWARE REMOVAL Right 04/02/2021   Procedure: Right Knee HARDWARE REMOVAL;  Surgeon: Arnie Lao, MD;  Location: [facility];  Service: Orthopedics;   Laterality: Right;   KNEE ARTHROSCOPY  02/15/2007   KNEE CLOSED REDUCTION Right 05/20/2021   Procedure: CLOSED MANIPULATION RIGHT KNEE;  Surgeon: Arnie Lao, MD;  Location: [facility];  Service: Orthopedics;  Laterality: Right;   MALONEY DILATION N/A 04/25/2022   Procedure: Londa Rival DILATION;  Surgeon: Suzette Espy, MD;  [facility];  Service: Endoscopy;  Laterality: N/A;   MASS EXCISION  02/11/2011   Procedure: EXCISION MASS;  Surgeon: Alphonso Jean, MD;  Location: [facility];  Service: Orthopedics;  Laterality: Right;  resection of neuroma right infrapatellar medial knee   PATELLA ARTHROPLASTY     rt knee-   POLYPECTOMY  04/25/2022   Procedure: POLYPECTOMY;  Surgeon: Suzette Espy, MD;  [facility];  Service: Endoscopy;;   REDUCTION MAMMAPLASTY Bilateral 2017   TOTAL KNEE ARTHROPLASTY  02/14/2009   partial-[place] [facility]    TOTAL KNEE ARTHROPLASTY Right 04/02/2021   Procedure: Right TOTAL KNEE ARTHROPLASTY;  Surgeon: Arnie Lao, MD;  Location: [facility];  Service: Orthopedics;  Laterality: Right;   TOTAL KNEE ARTHROPLASTY Left 05/05/2023   Procedure: LEFT TOTAL KNEE ARTHROPLASTY;  Surgeon: Arnie Lao, MD;  Location: [facility];  Service: Orthopedics;  Laterality: Left;   TUBAL LIGATION     Patient Active Problem List   Diagnosis Date Noted   Status post total left  knee replacement 05/05/2023   Encounter for screening colonoscopy 04/04/2022   Esophageal dysphagia 04/04/2022   Constipation 04/04/2022   Arthrofibrosis of right total knee arthroplasty, subsequent encounter 05/20/2021   Pain in right knee 04/19/2021   Stiffness of right knee, not elsewhere classified 04/19/2021   Difficulty in walking, not elsewhere classified 04/19/2021   Status post total knee replacement, right 04/02/2021   Allergic rhinitis due to pollen 06/22/2020   Eosinophilic esophagitis 06/22/2020   Rash and other  nonspecific skin eruption 06/22/2020   AC joint arthropathy 01/04/2017   Acute pain of right shoulder 11/15/2016   Other derangements of patella, unspecified knee 09/02/2015   Recurrent dislocation of right patella 09/02/2015   Breast hypertrophy in female 05/13/2015   Neuroma of lower extremity 02/11/2011    Class: Chronic    PCP: [name], PA-C   REFERRING PROVIDER: Arnie Lao, MD Next apt: 05/18/23  REFERRING DIAG: H84.696 (ICD-10-CM) - Status post total left knee replacement M17.12 (ICD-10-CM) - Unilateral primary osteoarthritis, left knee  THERAPY DIAG:  Status post total left knee replacement  Difficulty in walking, not elsewhere classified  Rationale for Evaluation and Treatment: Rehabilitation  ONSET DATE: 05/05/2023  SUBJECTIVE:   SUBJECTIVE STATEMENT: Knee is achey today, pain scale 5/10.  Swelling present today.  Eval:  Pt reports extended stay in hospital following sx due to BP changes and having a temperature. Pt reports being independent with all household and community ambulation with no need for AD but was in significant amount of pain. Pt would like to be able to decrease the pain level and return to being able to "get up and go." Pt reports taking compression sleeve off this morning due to pain.  PERTINENT HISTORY: Surgery was a week ago today, 05/05/23. Pt has hx of R TKA 2 years ago.  PAIN:  Are you having pain? Yes: NPRS scale: 2/10 Pain location: left knee all around Pain description: dull  Aggravating factors: walking, standing Relieving factors: sitting, iceman  PRECAUTIONS: Knee  RED FLAGS: None   WEIGHT BEARING RESTRICTIONS: No  FALLS:  Has patient fallen in last 6 months? No  LIVING ENVIRONMENT: Lives with: lives with their spouse and lives with their daughter Lives in: House/apartment Stairs: Yes: External: 1 steps; none Has following equipment at home: Single point cane and Crutches  OCCUPATION: tax  office  PLOF: Independent with household mobility without device, Independent with community mobility without device, and pain with stairs  PATIENT GOALS: get rid of pain, wants to get her life back, walk dogs, wants to get up and go  NEXT MD VISIT: 05/18/23  OBJECTIVE:  Note: Objective measures were completed at Evaluation unless otherwise noted.  DIAGNOSTIC FINDINGS:  Radiographs 3/21: CLINICAL DATA:  Status post total left knee arthroplasty.   EXAM: PORTABLE LEFT KNEE - 1-2 VIEW   COMPARISON:  Left knee radiographs 01/09/2023   FINDINGS: Interval total left knee arthroplasty. No perihardware lucency is seen to indicate hardware failure or loosening. Expected postoperative changes including intra-articular and subcutaneous air. Smalljoint effusion. Anterior surgical skin staples. No acute fracture or dislocation.   IMPRESSION: Interval total left knee arthroplasty without evidence of hardware failure.  PATIENT SURVEYS:  LEFS 30/80 = 37.5%  07/14/23:  LEFS 61/80= 76.3 COGNITION: Overall cognitive status: Within functional limits for tasks assessed     SENSATION: [facility] Not tested  EDEMA:  Pt demonstrates increased swelling in LLE with pt reporting, she took compression sleeve off this morning due to pain. Pt  denies throbbing pain and is educated on the risk of DVT and importance of inflammation control.  PALPATION: Pain/tenderness/soreness reported during palpation of L knee and mid calf region.   LOWER EXTREMITY ROM:  Active ROM Right eval Left eval Left 05/23/23 Left 06/09/23 Left 06/13/23 Left 06/22/2023 Left 06/28/23 Left 07/06/23 Left  07/13/23  Hip flexion           Hip extension           Hip abduction           Hip adduction           Hip internal rotation           Hip external rotation           Knee flexion 109 60 77 98  94 105 AAROM 110 116   Knee extension 4 15 -10 (lacking) -2 AAROM  -3 -7, 0 AAROM 6 lacking, AAROM 0 6 lacking  Ankle dorsiflexion            Ankle plantarflexion           Ankle inversion           Ankle eversion            (Blank rows = not tested)  LOWER EXTREMITY MMT: not formally assessed upon eval due to pain  MMT Right eval Left eval Left 07/14/23  Hip flexion [facility]  4/5  Hip extension WFL  3+  Hip abduction   4+  Hip adduction     Hip internal rotation     Hip external rotation     Knee flexion WFL  4+  Knee extension WFL  4/5  Ankle dorsiflexion WFL    Ankle plantarflexion WFL    Ankle inversion     Ankle eversion      (Blank rows = not tested)   FUNCTIONAL TESTS:  5 times sit to stand: TBA 2 minute walk test: 226 ft with RW with slow cadence, decreased stride length and step length bilaterally, antalgic  30 second sit to stand 7 times in split stance with UE assistance from chair    06/22/2023   30 Second chair stand test: 12x : 431ft  07/13/23:  456ft  30 seconds sit to stand test : 16x    GAIT: Distance walked: 100 Assistive device utilized: Environmental consultant - 2 wheeled Level of assistance: Modified independence Comments: decreased speed, decreased stride length and step length bilaterally, antalgic                                                                                                                                TREATMENT DATE:  07/13/23: 451ft 30 second sit to stand 16x MMT see above LEFS 61/80= 76.3 AROM 5-116 degrees   Manual: Decongestive techniques, STM to quad, PROM for extension/flexion, patella mobs   Bike seat 3 full revolution x  Supine: Heel slides 10x  AROM 5-116 degrees Standing:  Stairs reciprocal pattern 4in then 7in with Bil HR 1RT each Lateral step up 4in 15x Step down 4in 15x Squats 15x   07/12/23 - Recumbent bike seat 5 to seat 3 5' Standing: - Knee drives on 12in step 5 x 10" - squat front of chair 15x - TKE with ball against wall 10x 5" Supine: - Supine ball roll up w/ end range flexion emphasis + bridge on full extension  with TKE - 20 repetitions - Manual: PROM, STM, patella mobs all direction - AROM 6-117 degrees 438ft WNL gait mechanics (couple obstacles with pauses during testing) -Stool scoot 2RT  07/06/23: - Recumbent bike seat 5 to seat 3 5' Standing: - Posterior lunge onto foam for flexion - Knee drives on 12in step 5 x 10" ~10 w/ green swiss ball on back on wall with holding yellow weighted ball Supine: - Supine ball roll up w/ end range flexion emphasis + bridge on full extension with TKE - 20 repetitions - Manual: PROM, STM, patella mobs all direction - AROM 6-110 degrees (AAROM 0-112 degrees) Prone: - Contract/relax 5x 10" for flexion - quad stretch 3x 30"    07/05/23 TE - Recumbent bike rocking with close seat and min cuing for maintaining functional form appropriateness - Supine ball roll up w/ end range flexion emphasis + bridge on full extension with TKE - 20 repetitions - Step stretch into flexion as well as end range extension with applied pressure on thigh TA Transfer training - Squat to stand  ~10 lowered mat table (L LE posterior staggered stance) with full sit to stand  ~10 with yellow weighted ball and tap to mat table (staggered L LE posterior)  ~10 w/ green swiss ball on back on wall without holding ball  ~10 w/ green swiss ball on back on wall with holding yellow weighted ball Manual - Flexion mobs; STM and manual flexion to extension in supine w/ reduction of compensatory hip hike and improved tolerance throughout with end range  06/28/23 STM to left knee followed by manual ROM to improve left knee mobility and decrease soft tissue tightness x 15' Bike seat 6' x 5' full revolutions Heel raises 2 x 10 Slant board 5 x 20" Knee flexion drives on 12" box x 2\' 12"  box hamstring stretch 10" x 2' Squats x 10   06/22/2023  -Recumbent bike x 7' -ROM -STM x 8' with prolonged flexion hold over therapist arm in hooklying position -LEFS 60/80= 75% -30 Second Chair  Stand test -  06/20/2023  -Active mobility on recumbent bike x 8' with cues for ankle DF and limited hip hiking knee mobility-10'' second in place of "stretch" -Manual therapy-contract/relax of prone knee flexion 10 for 10'' -Manual therapy-active knee extension for 5 with 10'' passive manual applied stretch into extension -seated isolated L knee flexion on treadmill x 2'' -BLE pushing on treadmill backwards x 2'' with BUE support  06/16/23 Supine STM to left knee followed by manual ROM for flexion and extension to improve left knee joint mobility x 22' Sitting manual ROM for left knee flexion x 10 reps Bike seat 6 x half revolutions today x 12' for mobility     PATIENT EDUCATION:  Education details: Pt was educated on findings of PT evaluation, prognosis, frequency of therapy visits and rationale, attendance policy, and HEP if given.   Person educated: Patient Education method: Explanation Education comprehension: verbalized understanding and needs further education  HOME EXERCISE PROGRAM: Access Code:  [email or web address] URL: [email or web address] Date: 05/12/2023 Prepared by: Armond Bertin  Exercises - Supine Ankle Pumps - 3 x daily - 1 sets - 15-20 reps - Supine Hip Abduction - 3 x daily - 1 sets - 15-20 reps - Supine Quad Set - 3 x daily - 1 sets - 15-20 reps - 3 sec hold - Supine Heel Slide with Strap - 3 x daily - 3 sets - 15-20 reps - 3 sec hold   - Prone Knee Extension Hang  - 2 x daily - 7 x weekly - 1 sets - 2 reps - 1'+ hold - Prone Quadriceps Stretch with Strap  - 2 x daily - 7 x weekly - 1 sets - 3 reps - 30" hold - Supine Knee Extension Mobilization with Weight  - 2 x daily - 7 x weekly - 1 sets - 1'+ hold  ASSESSMENT:  CLINICAL IMPRESSION: Progress note prior MD apt later today.  Pt is progressing well towards POC with 2/2 STGs and 1/4 LTGs met.  Pt presents with increased cadence during gait with no AD during and faster during 30 second sit to stand  testing.  MMT complete today with good strength, continues to show weakness in gluts and distal quadriceps.  AROM 6-116 degrees today.  Improved self perceived functional abilities noted with LEFS survey.  Pt will continue to benefit from skilled intervention to address goals unmet.  Eval:Patient is a 53 y.o. female who was seen today for physical therapy evaluation and treatment for status post Left TKA, 05/05/2023. Pt demonstrates limited left knee ROM and strength, limited by pain. Pt demonstrates difficulty with ambulation and requires RW. Pt demonstrates decreased gait speed and efficiency due to pain and RW height, RW was adjusted and pt was educated on DME. Patient would continue to benefit from skilled physical therapy to increase pain free L knee ROM, increased LLE strength for improved quality of life, community ambulation and continued progress towards therapy goals.   OBJECTIVE IMPAIRMENTS: Abnormal gait, decreased activity tolerance, decreased balance, decreased coordination, decreased knowledge of use of DME, decreased mobility, difficulty walking, decreased ROM, decreased strength, increased edema, and pain.   ACTIVITY LIMITATIONS: carrying, bending, standing, squatting, sleeping, stairs, and bed mobility  PARTICIPATION LIMITATIONS: meal prep, cleaning, laundry, shopping, community activity, occupation, and yard work  PERSONAL FACTORS: Age, Past/current experiences, and 1 comorbidity: had to stay a bit longer in the hospital following sx due to temperature and BP changes are also affecting patient's functional outcome.   REHAB POTENTIAL: Good  CLINICAL DECISION MAKING: Stable/uncomplicated  EVALUATION COMPLEXITY: Low   GOALS: Goals reviewed with patient? No  SHORT TERM GOALS: Target date:07/06/23  Patient will demonstrate evidence of independence with individualized HEP and will report compliance for at least 3 days per week for optimized progression towards remaining therapy  goals. Baseline: following post op exercises; 07/14/23:  HEP compliance 3 times a day Goal status: MET  2.  Patient will report a decrease in pain level during community ambulation by at least 2 points for improved quality of life. Baseline: 4/10 Goal status: MET     LONG TERM GOALS: Target date: 07/20/23  Pt will demonstrate a an increase of at least 9 points on the LEFS for improved performance of community ambulation and ADL. Baseline: TBA next session; LEFS 30/80 = 37.5%07/14/23:  07/13/23:  LEFS 61/80= 76.3 Goal status: MET  2.  Pt will improve 2 MWT to at least 578 feet in order to demonstrate age norms for functional  ambulatory capacity in community setting.  Baseline: TBA as pt tolerates post op soreness; 07/14/23: 4107ft no AD Goal status: IN PROGRESS  3.  Pt will demonstrate WFL ROM (flexion and extension) in left knee, for increased mobility and maximal efficiency of gait cycle during ambulation. Baseline: see objective; 07/14/23: 5-116 degrees Goal status: IN PROGRESS  4.  Pt will demonstrate at least 4-/5 MMT for left lower extremity for increased strength during ADL and community ambulation. Baseline: TBA when pain reduces; 07/14/23: MMT see above Goal status: IN PROGRESS     PLAN:  PT FREQUENCY: 2x/week  PT DURATION: 4 weeks  PLANNED INTERVENTIONS: [id number]- PT Re-evaluation, 97750- Physical Performance Testing, 97110-Therapeutic exercises, 97530- Therapeutic activity, [id number]- Neuromuscular re-education, 97535- Self Care, [id number]- Manual therapy, Patient/Family education, Balance training, and Stair training  PLAN FOR NEXT SESSION: Knee mobilty and progress LLE strengthening.  Continue with focus on improving LT knee flexion.  F/u with MD apt.  Continued focus on end range of motion and functional strengthening.  Increase step down height as able.   Minor Amble, LPTA/CLT; CBIS [phone]  12:33 PM, 07/13/23  Armond Bertin, PT, DPT [facility] Office: [phone] 9:54 AM, 07/20/23

## 2023-07-13 NOTE — Progress Notes (Signed)
 The patient continues to make progress with her left knee status post a left total knee arthroplasty.  I did review notes from therapy today and they have been able to flex her to 116 degrees.  She still lacks full extension.  Her bigger complaint is low back pain to the left side.  She asked about the possibility of seeing a chiropractor for this and I am fine with her seeing her chiropractor since she has had some experience with that in the past.  On my exam today her left knee does like full extension by about 5 degrees and I can push her little bit harder.  Her flexion was definitely to 115 degrees on my exam and just beyond.  She will continue increase her activities as comfort allows.  Will see her back in 4 weeks at that visit I would like a standing AP and lateral of her left knee.  If she is still having issues with her spine we can always x-rayed her lumbar

## 2023-07-17 ENCOUNTER — Encounter (HOSPITAL_COMMUNITY)

## 2023-07-20 ENCOUNTER — Ambulatory Visit (HOSPITAL_COMMUNITY): Attending: Orthopaedic Surgery

## 2023-07-20 ENCOUNTER — Encounter (HOSPITAL_COMMUNITY): Payer: Self-pay

## 2023-07-20 DIAGNOSIS — R262 Difficulty in walking, not elsewhere classified: Secondary | ICD-10-CM | POA: Insufficient documentation

## 2023-07-20 DIAGNOSIS — Z96651 Presence of right artificial knee joint: Secondary | ICD-10-CM | POA: Diagnosis not present

## 2023-07-20 DIAGNOSIS — Z96652 Presence of left artificial knee joint: Secondary | ICD-10-CM | POA: Insufficient documentation

## 2023-07-20 NOTE — Therapy (Signed)
 OUTPATIENT PHYSICAL THERAPY LOWER EXTREMITY TREATMENT   Patient Name: Carrie [name] MRN: [id number] DOB:10-Jul-1970, 53 y.o., female Today's Date: 07/20/2023  END OF SESSION:  PT End of Session - 07/20/23 0807     Visit Number 18    Number of Visits 20    Date for PT Re-Evaluation 07/20/23    Authorization Type [employer], no auth required    Progress Note Due on Visit 20    PT Start Time 0801    PT Stop Time 0845    PT Time Calculation (min) 44 min    Activity Tolerance Patient tolerated treatment well    Behavior During Therapy [facility] for tasks assessed/performed            Past Medical History:  Diagnosis Date   Arthritis    Arthrofibrosis of right total knee arthroplasty, subsequent encounter 05/20/2021   Diabetes mellitus without complication (HCC)    type 2   Essential hypertension    GERD (gastroesophageal reflux disease)    Headache    migraine   Hypothyroidism    Mixed hyperlipidemia    PONV (postoperative nausea and vomiting)    Really Sick!!! Had to stay in the hospital   Past Surgical History:  Procedure Laterality Date   ABDOMINAL HYSTERECTOMY     BIOPSY  04/25/2022   Procedure: BIOPSY;  Surgeon: Suzette Espy, MD;  [facility];  Service: Endoscopy;;   COLONOSCOPY WITH PROPOFOL  N/A 04/25/2022   Procedure: COLONOSCOPY WITH PROPOFOL ;  Surgeon: Suzette Espy, MD;  [facility]: [facility];  Service: Endoscopy;  Laterality: N/A;  8:45 am, asa 2   ESOPHAGOGASTRODUODENOSCOPY (EGD) WITH PROPOFOL  N/A 04/25/2022   Procedure: ESOPHAGOGASTRODUODENOSCOPY (EGD) WITH PROPOFOL ;  Surgeon: Suzette Espy, MD;  [facility];  Service: Endoscopy;  Laterality: N/A;   HARDWARE REMOVAL Right 04/02/2021   Procedure: Right Knee HARDWARE REMOVAL;  Surgeon: Arnie Lao, MD;  Location: [facility];  Service: Orthopedics;  Laterality: Right;   KNEE ARTHROSCOPY  02/15/2007   KNEE CLOSED REDUCTION Right 05/20/2021   Procedure: CLOSED MANIPULATION RIGHT  KNEE;  Surgeon: Arnie Lao, MD;  Location: [facility];  Service: Orthopedics;  Laterality: Right;   MALONEY DILATION N/A 04/25/2022   Procedure: Londa Rival DILATION;  Surgeon: Suzette Espy, MD;  [facility]: [facility];  Service: Endoscopy;  Laterality: N/A;   MASS EXCISION  02/11/2011   Procedure: EXCISION MASS;  Surgeon: Alphonso Jean, MD;  Location:  [facility];  Service: Orthopedics;  Laterality: Right;  resection of neuroma right infrapatellar medial knee   PATELLA ARTHROPLASTY     rt knee-   POLYPECTOMY  04/25/2022   Procedure: POLYPECTOMY;  Surgeon: Suzette Espy, MD;  [facility]: [facility];  Service: Endoscopy;;   REDUCTION MAMMAPLASTY Bilateral 2017   TOTAL KNEE ARTHROPLASTY  02/14/2009   partial-[facility] reg    TOTAL KNEE ARTHROPLASTY Right 04/02/2021   Procedure: Right TOTAL KNEE ARTHROPLASTY;  Surgeon: Arnie Lao, MD;  Location: [facility];  Service: Orthopedics;  Laterality: Right;   TOTAL KNEE ARTHROPLASTY Left 05/05/2023   Procedure: LEFT TOTAL KNEE ARTHROPLASTY;  Surgeon: Arnie Lao, MD;  Location: [facility];  Service: Orthopedics;  Laterality: Left;   TUBAL LIGATION     Patient Active Problem List   Diagnosis Date Noted   Status post total left knee replacement 05/05/2023   Encounter for screening colonoscopy 04/04/2022   Esophageal dysphagia 04/04/2022   Constipation 04/04/2022   Arthrofibrosis  of right total knee arthroplasty, subsequent encounter 05/20/2021   Pain in right knee 04/19/2021   Stiffness of right knee, not elsewhere classified 04/19/2021   Difficulty in walking, not elsewhere classified 04/19/2021   Status post total knee replacement, right 04/02/2021   Allergic rhinitis due to pollen 06/22/2020   Eosinophilic esophagitis 06/22/2020   Rash and other nonspecific skin eruption 06/22/2020   AC joint arthropathy 01/04/2017   Acute pain of right shoulder 11/15/2016   Other derangements of  patella, unspecified knee 09/02/2015   Recurrent dislocation of right patella 09/02/2015   Breast hypertrophy in female 05/13/2015   Neuroma of lower extremity 02/11/2011    Class: Chronic    PCP: [name], PA-C   REFERRING PROVIDER: Arnie Lao, MD Next apt: 05/18/23  REFERRING DIAG: J47.829 (ICD-10-CM) - Status post total left knee replacement M17.12 (ICD-10-CM) - Unilateral primary osteoarthritis, left knee  THERAPY DIAG:  Status post total left knee replacement  Difficulty in walking, not elsewhere classified  Status post total knee replacement, right  Rationale for Evaluation and Treatment: Rehabilitation  ONSET DATE: 05/05/2023  SUBJECTIVE:   SUBJECTIVE STATEMENT: MD happy with progress, instructed to finish POC with PT.  Wishes to pt continue working on extension.  No reports of pain today.  Ankle is swollen today, has done a lot of sitting with work yesterday  Eval:  Pt reports extended stay in hospital following sx due to BP changes and having a temperature. Pt reports being independent with all household and community ambulation with no need for AD but was in significant amount of pain. Pt would like to be able to decrease the pain level and return to being able to "get up and go." Pt reports taking compression sleeve off this morning due to pain.  PERTINENT HISTORY: Surgery was a week ago today, 05/05/23. Pt has hx of R TKA 2 years ago.  PAIN:  Are you having pain? Yes: NPRS scale: 2/10 Pain location: left knee all around Pain description: dull  Aggravating factors: walking, standing Relieving factors: sitting, iceman  PRECAUTIONS: Knee  RED FLAGS: None   WEIGHT BEARING RESTRICTIONS: No  FALLS:  Has patient fallen in last 6 months? No  LIVING ENVIRONMENT: Lives with: lives with their spouse and lives with their daughter Lives in: House/apartment Stairs: Yes: External: 1 steps; none Has following equipment at home: Single point cane  and Crutches  OCCUPATION: tax office  PLOF: Independent with household mobility without device, Independent with community mobility without device, and pain with stairs  PATIENT GOALS: get rid of pain, wants to get her life back, walk dogs, wants to get up and go  NEXT MD VISIT: 05/18/23  OBJECTIVE:  Note: Objective measures were completed at Evaluation unless otherwise noted.  DIAGNOSTIC FINDINGS:  Radiographs 3/21: CLINICAL DATA:  Status post total left knee arthroplasty.   EXAM: PORTABLE LEFT KNEE - 1-2 VIEW   COMPARISON:  Left knee radiographs 01/09/2023   FINDINGS: Interval total left knee arthroplasty. No perihardware lucency is seen to indicate hardware failure or loosening. Expected postoperative changes including intra-articular and subcutaneous air. Smalljoint effusion. Anterior surgical skin staples. No acute fracture or dislocation.   IMPRESSION: Interval total left knee arthroplasty without evidence of hardware failure.  PATIENT SURVEYS:  LEFS 30/80 = 37.5%  07/14/23:  LEFS 61/80= 76.3 COGNITION: Overall cognitive status: Within functional limits for tasks assessed     SENSATION: [facility] Not tested  EDEMA:  Pt demonstrates increased swelling in LLE with pt  reporting, she took compression sleeve off this morning due to pain. Pt denies throbbing pain and is educated on the risk of DVT and importance of inflammation control.  PALPATION: Pain/tenderness/soreness reported during palpation of L knee and mid calf region.   LOWER EXTREMITY ROM:  Active ROM Right eval Left eval Left 05/23/23 Left 06/09/23 Left 06/13/23 Left 06/22/2023 Left 06/28/23 Left 07/06/23 Left  07/13/23  Hip flexion           Hip extension           Hip abduction           Hip adduction           Hip internal rotation           Hip external rotation           Knee flexion 109 60 77 98  94 105 AAROM 110 116   Knee extension 4 15 -10 (lacking) -2 AAROM  -3 -7, 0 AAROM 6 lacking, AAROM 0 6  lacking  Ankle dorsiflexion           Ankle plantarflexion           Ankle inversion           Ankle eversion            (Blank rows = not tested)  LOWER EXTREMITY MMT: not formally assessed upon eval due to pain  MMT Right eval Left eval Left 07/14/23  Hip flexion [facility]  4/5  Hip extension WFL  3+  Hip abduction   4+  Hip adduction     Hip internal rotation     Hip external rotation     Knee flexion WFL  4+  Knee extension WFL  4/5  Ankle dorsiflexion WFL    Ankle plantarflexion WFL    Ankle inversion     Ankle eversion      (Blank rows = not tested)   FUNCTIONAL TESTS:  5 times sit to stand: TBA 2 minute walk test: 226 ft with RW with slow cadence, decreased stride length and step length bilaterally, antalgic  30 second sit to stand 7 times in split stance with UE assistance from chair    06/22/2023   30 Second chair stand test: 12x : 417ft  07/13/23:  459ft  30 seconds sit to stand test : 16x    GAIT: Distance walked: 100 Assistive device utilized: Environmental consultant - 2 wheeled Level of assistance: Modified independence Comments: decreased speed, decreased stride length and step length bilaterally, antalgic                                                                                                                                TREATMENT DATE:  07/20/23: Bike seat 3 full revolution x Manual: Decongestive techniques, STM to quad, PROM for extension x 3 min, patella mobs Supine: Ankle pumps with elevation Ankle circles with elevation AROM 6-112  degrees Prone:  TKE 15x 5"  Contract/relax 5x 10" holds Standing:   TKE 10x 5" - HEP  Step down 4in then 6in 15x 1HR    07/13/23: 434ft 30 second sit to stand 16x MMT see above LEFS 61/80= 76.3 AROM 5-116 degrees   Manual: Decongestive techniques, STM to quad, PROM for extension/flexion, patella mobs   Bike seat 3 full revolution x  Supine: Heel slides 10x AROM 5-116  degrees Standing:  Stairs reciprocal pattern 4in then 7in with Bil HR 1RT each Lateral step up 4in 15x Step down 4in 15x Squats 15x   07/12/23 - Recumbent bike seat 5 to seat 3 5' Standing: - Knee drives on 12in step 5 x 10" - squat front of chair 15x - TKE with ball against wall 10x 5" Supine: - Supine ball roll up w/ end range flexion emphasis + bridge on full extension with TKE - 20 repetitions - Manual: PROM, STM, patella mobs all direction - AROM 6-117 degrees 453ft WNL gait mechanics (couple obstacles with pauses during testing) -Stool scoot 2RT  07/06/23: - Recumbent bike seat 5 to seat 3 5' Standing: - Posterior lunge onto foam for flexion - Knee drives on 12in step 5 x 10" ~10 w/ green swiss ball on back on wall with holding yellow weighted ball Supine: - Supine ball roll up w/ end range flexion emphasis + bridge on full extension with TKE - 20 repetitions - Manual: PROM, STM, patella mobs all direction - AROM 6-110 degrees (AAROM 0-112 degrees) Prone: - Contract/relax 5x 10" for flexion - quad stretch 3x 30"    07/05/23 TE - Recumbent bike rocking with close seat and min cuing for maintaining functional form appropriateness - Supine ball roll up w/ end range flexion emphasis + bridge on full extension with TKE - 20 repetitions - Step stretch into flexion as well as end range extension with applied pressure on thigh TA Transfer training - Squat to stand  ~10 lowered mat table (L LE posterior staggered stance) with full sit to stand  ~10 with yellow weighted ball and tap to mat table (staggered L LE posterior)  ~10 w/ green swiss ball on back on wall without holding ball  ~10 w/ green swiss ball on back on wall with holding yellow weighted ball Manual - Flexion mobs; STM and manual flexion to extension in supine w/ reduction of compensatory hip hike and improved tolerance throughout with end range  06/28/23 STM to left knee followed by manual ROM to  improve left knee mobility and decrease soft tissue tightness x 15' Bike seat 6' x 5' full revolutions Heel raises 2 x 10 Slant board 5 x 20" Knee flexion drives on 12" box x 2\' 12"  box hamstring stretch 10" x 2' Squats x 10   06/22/2023  -Recumbent bike x 7' -ROM -STM x 8' with prolonged flexion hold over therapist arm in hooklying position -LEFS 60/80= 75% -30 Second Chair Stand test -  06/20/2023  -Active mobility on recumbent bike x 8' with cues for ankle DF and limited hip hiking knee mobility-10'' second in place of "stretch" -Manual therapy-contract/relax of prone knee flexion 10 for 10'' -Manual therapy-active knee extension for 5 with 10'' passive manual applied stretch into extension -seated isolated L knee flexion on treadmill x 2'' -BLE pushing on treadmill backwards x 2'' with BUE support  06/16/23 Supine STM to left knee followed by manual ROM for flexion and extension to improve left knee joint mobility  x 22' Sitting manual ROM for left knee flexion x 10 reps Bike seat 6 x half revolutions today x 12' for mobility     PATIENT EDUCATION:  Education details: Pt was educated on findings of PT evaluation, prognosis, frequency of therapy visits and rationale, attendance policy, and HEP if given.   Person educated: Patient Education method: Explanation Education comprehension: verbalized understanding and needs further education  HOME EXERCISE PROGRAM: Access Code: [email or web address] URL: [email or web address] Date: 05/12/2023 Prepared by: Armond Bertin  Exercises - Supine Ankle Pumps - 3 x daily - 1 sets - 15-20 reps - Supine Hip Abduction - 3 x daily - 1 sets - 15-20 reps - Supine Quad Set - 3 x daily - 1 sets - 15-20 reps - 3 sec hold - Supine Heel Slide with Strap - 3 x daily - 3 sets - 15-20 reps - 3 sec hold   - Prone Knee Extension Hang  - 2 x daily - 7 x weekly - 1 sets - 2 reps - 1'+ hold - Prone Quadriceps Stretch with Strap  - 2 x daily - 7 x  weekly - 1 sets - 3 reps - 30" hold - Supine Knee Extension Mobilization with Weight  - 2 x daily - 7 x weekly - 1 sets - 1'+ hold  07/20/23: terminal knee extension with theraband ASSESSMENT:  CLINICAL IMPRESSION: Presents with increased edema proximal knee and ankle.  Manual decompression technqiues complete for edema control.  Reviewed benefits of compression garments for edema control, pt wore TED hose into dept and plans to wear compression today at work.  Decreased AROM today compared to last session due to edema, AROM 6-112.  Continue therapeutic exercises to address extension lag.  Functional strengthening exercises complete at EOS, able to increase height with step down with improved eccentric control.  No reports of pain through session.    Eval:Patient is a 53 y.o. female who was seen today for physical therapy evaluation and treatment for status post Left TKA, 05/05/2023. Pt demonstrates limited left knee ROM and strength, limited by pain. Pt demonstrates difficulty with ambulation and requires RW. Pt demonstrates decreased gait speed and efficiency due to pain and RW height, RW was adjusted and pt was educated on DME. Patient would continue to benefit from skilled physical therapy to increase pain free L knee ROM, increased LLE strength for improved quality of life, community ambulation and continued progress towards therapy goals.   OBJECTIVE IMPAIRMENTS: Abnormal gait, decreased activity tolerance, decreased balance, decreased coordination, decreased knowledge of use of DME, decreased mobility, difficulty walking, decreased ROM, decreased strength, increased edema, and pain.   ACTIVITY LIMITATIONS: carrying, bending, standing, squatting, sleeping, stairs, and bed mobility  PARTICIPATION LIMITATIONS: meal prep, cleaning, laundry, shopping, community activity, occupation, and yard work  PERSONAL FACTORS: Age, Past/current experiences, and 1 comorbidity: had to stay a bit longer in the  hospital following sx due to temperature and BP changes are also affecting patient's functional outcome.   REHAB POTENTIAL: Good  CLINICAL DECISION MAKING: Stable/uncomplicated  EVALUATION COMPLEXITY: Low   GOALS: Goals reviewed with patient? No  SHORT TERM GOALS: Target date:07/06/23  Patient will demonstrate evidence of independence with individualized HEP and will report compliance for at least 3 days per week for optimized progression towards remaining therapy goals. Baseline: following post op exercises; 07/14/23:  HEP compliance 3 times a day Goal status: MET  2.  Patient will report a decrease in pain level during community ambulation by at least 2 points  for improved quality of life. Baseline: 4/10 Goal status: MET     LONG TERM GOALS: Target date: 07/20/23  Pt will demonstrate a an increase of at least 9 points on the LEFS for improved performance of community ambulation and ADL. Baseline: TBA next session; LEFS 30/80 = 37.5%07/14/23:  07/13/23:  LEFS 61/80= 76.3 Goal status: MET  2.  Pt will improve 2 MWT to at least 578 feet in order to demonstrate age norms for functional ambulatory capacity in community setting.  Baseline: TBA as pt tolerates post op soreness; 07/14/23: 46ft no AD Goal status: IN PROGRESS  3.  Pt will demonstrate WFL ROM (flexion and extension) in left knee, for increased mobility and maximal efficiency of gait cycle during ambulation. Baseline: see objective; 07/14/23: 5-116 degrees Goal status: IN PROGRESS  4.  Pt will demonstrate at least 4-/5 MMT for left lower extremity for increased strength during ADL and community ambulation. Baseline: TBA when pain reduces; 07/14/23: MMT see above Goal status: IN PROGRESS     PLAN:  PT FREQUENCY: 2x/week  PT DURATION: 4 weeks  PLANNED INTERVENTIONS: 97164- PT Re-evaluation, 97750- Physical Performance Testing, 97110-Therapeutic exercises, 97530- Therapeutic activity, [id number]- Neuromuscular  re-education, 97535- Self Care, 16109- Manual therapy, Patient/Family education, Balance training, and Stair training  PLAN FOR NEXT SESSION: Knee mobilty and progress LLE strengthening.  Continue with focus on improving LT knee flexion.  Pt to continue 2 more session per MD with focus on improving end range extension and flexion and functional strengthening.  Assure pt has good HEP upon discharge.  Minor Amble, LPTA/CLT; CBIS [phone]  10:54 AM, 07/20/23

## 2023-07-20 NOTE — Addendum Note (Signed)
 Addended by: Blaire Hodsdon on: 07/20/2023 10:20 AM   Modules accepted: Orders

## 2023-07-24 ENCOUNTER — Ambulatory Visit (HOSPITAL_COMMUNITY)

## 2023-07-24 DIAGNOSIS — M9903 Segmental and somatic dysfunction of lumbar region: Secondary | ICD-10-CM | POA: Diagnosis not present

## 2023-07-24 DIAGNOSIS — Z96652 Presence of left artificial knee joint: Secondary | ICD-10-CM

## 2023-07-24 DIAGNOSIS — M9901 Segmental and somatic dysfunction of cervical region: Secondary | ICD-10-CM | POA: Diagnosis not present

## 2023-07-24 DIAGNOSIS — R262 Difficulty in walking, not elsewhere classified: Secondary | ICD-10-CM | POA: Diagnosis not present

## 2023-07-24 DIAGNOSIS — M9902 Segmental and somatic dysfunction of thoracic region: Secondary | ICD-10-CM | POA: Diagnosis not present

## 2023-07-24 DIAGNOSIS — Z96651 Presence of right artificial knee joint: Secondary | ICD-10-CM | POA: Diagnosis not present

## 2023-07-24 DIAGNOSIS — M5030 Other cervical disc degeneration, unspecified cervical region: Secondary | ICD-10-CM | POA: Diagnosis not present

## 2023-07-24 NOTE — Therapy (Signed)
 OUTPATIENT PHYSICAL THERAPY LOWER EXTREMITY TREATMENT/DISCHARGE PHYSICAL THERAPY DISCHARGE SUMMARY  Visits from Start of Care: 19  Current functional level related to goals / functional outcomes: See below   Remaining deficits: See below   Education / Equipment: HEP   Patient agrees to discharge. Patient goals were met. Patient is being discharged due to meeting the stated rehab goals. Met all but one set rehab goal    Patient Name: Carrie [name] MRN: [id number] DOB:02/08/1971, 53 y.o., female Today's Date: 07/24/2023  END OF SESSION:  PT End of Session - 07/24/23 1346     Visit Number 19    Number of Visits 20    Date for PT Re-Evaluation 07/20/23    Authorization Type [employer], no auth required    Progress Note Due on Visit 20    PT Start Time 1347    PT Stop Time 1411    PT Time Calculation (min) 24 min    Activity Tolerance Patient tolerated treatment well    Behavior During Therapy WFL for tasks assessed/performed            Past Medical History:  Diagnosis Date   Arthritis    Arthrofibrosis of right total knee arthroplasty, subsequent encounter 05/20/2021   Diabetes mellitus without complication (HCC)    type 2   Essential hypertension    GERD (gastroesophageal reflux disease)    Headache    migraine   Hypothyroidism    Mixed hyperlipidemia    PONV (postoperative nausea and vomiting)    Really Sick!!! Had to stay in the hospital   Past Surgical History:  Procedure Laterality Date   ABDOMINAL HYSTERECTOMY     BIOPSY  04/25/2022   Procedure: BIOPSY;  Surgeon: Suzette Espy, MD;  Location: [facility];  Service: Endoscopy;;   COLONOSCOPY WITH PROPOFOL  N/A 04/25/2022   Procedure: COLONOSCOPY WITH PROPOFOL ;  Surgeon: Suzette Espy, MD;  Location: [facility];  Service: Endoscopy;  Laterality: N/A;  8:45 am, asa 2   ESOPHAGOGASTRODUODENOSCOPY (EGD) WITH PROPOFOL  N/A 04/25/2022   Procedure: ESOPHAGOGASTRODUODENOSCOPY (EGD) WITH PROPOFOL ;  Surgeon:  Suzette Espy, MD;  Location: [facility];  Service: Endoscopy;  Laterality: N/A;   HARDWARE REMOVAL Right 04/02/2021   Procedure: Right Knee HARDWARE REMOVAL;  Surgeon: Arnie Lao, MD;  Location: [facility];  Service: Orthopedics;  Laterality: Right;   KNEE ARTHROSCOPY  02/15/2007   KNEE CLOSED REDUCTION Right 05/20/2021   Procedure: CLOSED MANIPULATION RIGHT KNEE;  Surgeon: Arnie Lao, MD;  Location: [facility];  Service: Orthopedics;  Laterality: Right;   MALONEY DILATION N/A 04/25/2022   Procedure: Londa Rival DILATION;  Surgeon: Suzette Espy, MD;  Location: [facility];  Service: Endoscopy;  Laterality: N/A;   MASS EXCISION  02/11/2011   Procedure: EXCISION MASS;  Surgeon: Alphonso Jean, MD;  Location: [facility];  Service: Orthopedics;  Laterality: Right;  resection of neuroma right infrapatellar medial knee   PATELLA ARTHROPLASTY     rt knee-   POLYPECTOMY  04/25/2022   Procedure: POLYPECTOMY;  Surgeon: Suzette Espy, MD;  Location: [facility];  Service: Endoscopy;;   REDUCTION MAMMAPLASTY Bilateral 2017   TOTAL KNEE ARTHROPLASTY  02/14/2009   partial- reg    TOTAL KNEE ARTHROPLASTY Right 04/02/2021   Procedure: Right TOTAL KNEE ARTHROPLASTY;  Surgeon: Arnie Lao, MD;  Location: [facility];  Service: Orthopedics;  Laterality: Right;   TOTAL KNEE ARTHROPLASTY Left 05/05/2023   Procedure: LEFT  TOTAL KNEE ARTHROPLASTY;  Surgeon: Arnie Lao, MD;  Location: [facility];  Service: Orthopedics;  Laterality: Left;   TUBAL LIGATION     Patient Active Problem List   Diagnosis Date Noted   Status post total left knee replacement 05/05/2023   Encounter for screening colonoscopy 04/04/2022   Esophageal dysphagia 04/04/2022   Constipation 04/04/2022   Arthrofibrosis of right total knee arthroplasty, subsequent encounter 05/20/2021   Pain in right knee 04/19/2021   Stiffness of right knee, not elsewhere  classified 04/19/2021   Difficulty in walking, not elsewhere classified 04/19/2021   Status post total knee replacement, right 04/02/2021   Allergic rhinitis due to pollen 06/22/2020   Eosinophilic esophagitis 06/22/2020   Rash and other nonspecific skin eruption 06/22/2020   AC joint arthropathy 01/04/2017   Acute pain of right shoulder 11/15/2016   Other derangements of patella, unspecified knee 09/02/2015   Recurrent dislocation of right patella 09/02/2015   Breast hypertrophy in female 05/13/2015   Neuroma of lower extremity 02/11/2011    Class: Chronic    PCP: [name], PA-C   REFERRING PROVIDER: Arnie Lao, MD Next apt: 05/18/23  REFERRING DIAG: N62.952 (ICD-10-CM) - Status post total left knee replacement M17.12 (ICD-10-CM) - Unilateral primary osteoarthritis, left knee  THERAPY DIAG:  Status post total left knee replacement  Difficulty in walking, not elsewhere classified  Rationale for Evaluation and Treatment: Rehabilitation  ONSET DATE: 05/05/2023  SUBJECTIVE:   SUBJECTIVE STATEMENT: Has returned to work; is doing well; ready for discharge; " 1001%" better  Eval:  Pt reports extended stay in hospital following sx due to BP changes and having a temperature. Pt reports being independent with all household and community ambulation with no need for AD but was in significant amount of pain. Pt would like to be able to decrease the pain level and return to being able to "get up and go." Pt reports taking compression sleeve off this morning due to pain.  PERTINENT HISTORY: Surgery was a week ago today, 05/05/23. Pt has hx of R TKA 2 years ago.  PAIN:  Are you having pain? Yes: NPRS scale: 2/10 Pain location: left knee all around Pain description: dull  Aggravating factors: walking, standing Relieving factors: sitting, iceman  PRECAUTIONS: Knee  RED FLAGS: None   WEIGHT BEARING RESTRICTIONS: No  FALLS:  Has patient fallen in last 6  months? No  LIVING ENVIRONMENT: Lives with: lives with their spouse and lives with their daughter Lives in: House/apartment Stairs: Yes: External: 1 steps; none Has following equipment at home: Single point cane and Crutches  OCCUPATION: tax office  PLOF: Independent with household mobility without device, Independent with community mobility without device, and pain with stairs  PATIENT GOALS: get rid of pain, wants to get her life back, walk dogs, wants to get up and go  NEXT MD VISIT: 05/18/23  OBJECTIVE:  Note: Objective measures were completed at Evaluation unless otherwise noted.  DIAGNOSTIC FINDINGS:  Radiographs 3/21: CLINICAL DATA:  Status post total left knee arthroplasty.   EXAM: PORTABLE LEFT KNEE - 1-2 VIEW   COMPARISON:  Left knee radiographs 01/09/2023   FINDINGS: Interval total left knee arthroplasty. No perihardware lucency is seen to indicate hardware failure or loosening. Expected postoperative changes including intra-articular and subcutaneous air. Smalljoint effusion. Anterior surgical skin staples. No acute fracture or dislocation.   IMPRESSION: Interval total left knee arthroplasty without evidence of hardware failure.  PATIENT SURVEYS:  LEFS 30/80 = 37.5%  07/14/23:  LEFS 61/80= 76.3 COGNITION: Overall cognitive status: Within functional limits for tasks assessed     SENSATION: [facility] Not tested  EDEMA:  Pt demonstrates increased swelling in LLE with pt reporting, she took compression sleeve off this morning due to pain. Pt denies throbbing pain and is educated on the risk of DVT and importance of inflammation control.  PALPATION: Pain/tenderness/soreness reported during palpation of L knee and mid calf region.   LOWER EXTREMITY ROM:  Active ROM Right eval Left eval Left 05/23/23 Left 06/09/23 Left 06/13/23 Left 06/22/2023 Left 06/28/23 Left 07/06/23 Left  07/13/23 Left 07/24/23  Hip flexion            Hip extension            Hip abduction             Hip adduction            Hip internal rotation            Hip external rotation            Knee flexion 109 60 77 98  94 105 AAROM 110 116  120  Knee extension 4 15 -10 (lacking) -2 AAROM  -3 -7, 0 AAROM 6 lacking, AAROM 0 6 lacking   Ankle dorsiflexion            Ankle plantarflexion            Ankle inversion            Ankle eversion             (Blank rows = not tested)  LOWER EXTREMITY MMT: not formally assessed upon eval due to pain  MMT Right eval Left eval Left 07/14/23 Left 07/24/23  Hip flexion WFL 4+ 4/5   Hip extension WFL  3+   Hip abduction   4+   Hip adduction      Hip internal rotation      Hip external rotation      Knee flexion WFL 5 4+   Knee extension WFL 5 4/5   Ankle dorsiflexion WFL     Ankle plantarflexion WFL     Ankle inversion      Ankle eversion       (Blank rows = not tested)   FUNCTIONAL TESTS:  5 times sit to stand: TBA 2 minute walk test: 226 ft with RW with slow cadence, decreased stride length and step length bilaterally, antalgic  30 second sit to stand 7 times in split stance with UE assistance from chair    06/22/2023   30 Second chair stand test: 12x : 448ft  07/13/23:  455ft  30 seconds sit to stand test : 16x    GAIT: Distance walked: 100 Assistive device utilized: Walker - 2 wheeled Level of assistance: Modified independence Comments: decreased speed, decreased stride length and step length bilaterally, antalgic  TREATMENT DATE:  07/24/23 Bike seat 5 x 5' dynamic warm up Left knee drives for flexion on 12" step x 2 Progress note 2 MWT 465 ft 30 sec sit to stand 19 reps LEFS 79/80 98.6% AROM and MMT's see above   07/20/23: Bike seat 3 full revolution x Manual: Decongestive techniques, STM to quad, PROM for extension x 3 min, patella mobs Supine: Ankle pumps with  elevation Ankle circles with elevation AROM 6-112 degrees Prone:  TKE 15x 5"  Contract/relax 5x 10" holds Standing:   TKE 10x 5" - HEP  Step down 4in then 6in 15x 1HR    07/13/23: 463ft 30 second sit to stand 16x MMT see above LEFS 61/80= 76.3 AROM 5-116 degrees   Manual: Decongestive techniques, STM to quad, PROM for extension/flexion, patella mobs   Bike seat 3 full revolution x  Supine: Heel slides 10x AROM 5-116 degrees Standing:  Stairs reciprocal pattern 4in then 7in with Bil HR 1RT each Lateral step up 4in 15x Step down 4in 15x Squats 15x   07/12/23 - Recumbent bike seat 5 to seat 3 5' Standing: - Knee drives on 12in step 5 x 10" - squat front of chair 15x - TKE with ball against wall 10x 5" Supine: - Supine ball roll up w/ end range flexion emphasis + bridge on full extension with TKE - 20 repetitions - Manual: PROM, STM, patella mobs all direction - AROM 6-117 degrees 479ft WNL gait mechanics (couple obstacles with pauses during testing) -Stool scoot 2RT  07/06/23: - Recumbent bike seat 5 to seat 3 5' Standing: - Posterior lunge onto foam for flexion - Knee drives on 12in step 5 x 10" ~10 w/ green swiss ball on back on wall with holding yellow weighted ball Supine: - Supine ball roll up w/ end range flexion emphasis + bridge on full extension with TKE - 20 repetitions - Manual: PROM, STM, patella mobs all direction - AROM 6-110 degrees (AAROM 0-112 degrees) Prone: - Contract/relax 5x 10" for flexion - quad stretch 3x 30"    07/05/23 TE - Recumbent bike rocking with close seat and min cuing for maintaining functional form appropriateness - Supine ball roll up w/ end range flexion emphasis + bridge on full extension with TKE - 20 repetitions - Step stretch into flexion as well as end range extension with applied pressure on thigh TA Transfer training - Squat to stand  ~10 lowered mat table (L LE posterior staggered stance) with  full sit to stand  ~10 with yellow weighted ball and tap to mat table (staggered L LE posterior)  ~10 w/ green swiss ball on back on wall without holding ball  ~10 w/ green swiss ball on back on wall with holding yellow weighted ball Manual - Flexion mobs; STM and manual flexion to extension in supine w/ reduction of compensatory hip hike and improved tolerance throughout with end range  06/28/23 STM to left knee followed by manual ROM to improve left knee mobility and decrease soft tissue tightness x 15' Bike seat 6' x 5' full revolutions Heel raises 2 x 10 Slant board 5 x 20" Knee flexion drives on 12" box x 2\' 12"  box hamstring stretch 10" x 2' Squats x 10   06/22/2023  -Recumbent bike x 7' -ROM -STM x 8' with prolonged flexion hold over therapist arm in hooklying position -LEFS 60/80= 75% -30 Second Chair Stand test -  06/20/2023  -Active mobility on recumbent bike x 8' with  cues for ankle DF and limited hip hiking knee mobility-10'' second in place of "stretch" -Manual therapy-contract/relax of prone knee flexion 10 for 10'' -Manual therapy-active knee extension for 5 with 10'' passive manual applied stretch into extension -seated isolated L knee flexion on treadmill x 2'' -BLE pushing on treadmill backwards x 2'' with BUE support  06/16/23 Supine STM to left knee followed by manual ROM for flexion and extension to improve left knee joint mobility x 22' Sitting manual ROM for left knee flexion x 10 reps Bike seat 6 x half revolutions today x 12' for mobility     PATIENT EDUCATION:  Education details: Pt was educated on findings of PT evaluation, prognosis, frequency of therapy visits and rationale, attendance policy, and HEP if given.   Person educated: Patient Education method: Explanation Education comprehension: verbalized understanding and needs further education  HOME EXERCISE PROGRAM: Access Code: [email or web address] URL: [email or web address] Date:  05/12/2023 Prepared by: Armond Bertin  Exercises - Supine Ankle Pumps - 3 x daily - 1 sets - 15-20 reps - Supine Hip Abduction - 3 x daily - 1 sets - 15-20 reps - Supine Quad Set - 3 x daily - 1 sets - 15-20 reps - 3 sec hold - Supine Heel Slide with Strap - 3 x daily - 3 sets - 15-20 reps - 3 sec hold   - Prone Knee Extension Hang  - 2 x daily - 7 x weekly - 1 sets - 2 reps - 1'+ hold - Prone Quadriceps Stretch with Strap  - 2 x daily - 7 x weekly - 1 sets - 3 reps - 30" hold - Supine Knee Extension Mobilization with Weight  - 2 x daily - 7 x weekly - 1 sets - 1'+ hold  07/20/23: terminal knee extension with theraband ASSESSMENT:  CLINICAL IMPRESSION: Progress note; patient has met all but 1 set rehab goal and is agreeable to discharge at this time.   Eval:Patient is a 53 y.o. female who was seen today for physical therapy evaluation and treatment for status post Left TKA, 05/05/2023. Pt demonstrates limited left knee ROM and strength, limited by pain. Pt demonstrates difficulty with ambulation and requires RW. Pt demonstrates decreased gait speed and efficiency due to pain and RW height, RW was adjusted and pt was educated on DME. Patient would continue to benefit from skilled physical therapy to increase pain free L knee ROM, increased LLE strength for improved quality of life, community ambulation and continued progress towards therapy goals.   OBJECTIVE IMPAIRMENTS: Abnormal gait, decreased activity tolerance, decreased balance, decreased coordination, decreased knowledge of use of DME, decreased mobility, difficulty walking, decreased ROM, decreased strength, increased edema, and pain.   ACTIVITY LIMITATIONS: carrying, bending, standing, squatting, sleeping, stairs, and bed mobility  PARTICIPATION LIMITATIONS: meal prep, cleaning, laundry, shopping, community activity, occupation, and yard work  PERSONAL FACTORS: Age, Past/current experiences, and 1 comorbidity: had to stay a bit longer  in the hospital following sx due to temperature and BP changes are also affecting patient's functional outcome.   REHAB POTENTIAL: Good  CLINICAL DECISION MAKING: Stable/uncomplicated  EVALUATION COMPLEXITY: Low   GOALS: Goals reviewed with patient? No  SHORT TERM GOALS: Target date:07/06/23  Patient will demonstrate evidence of independence with individualized HEP and will report compliance for at least 3 days per week for optimized progression towards remaining therapy goals. Baseline: following post op exercises; 07/14/23:  HEP compliance 3 times a day Goal status: MET  2.  Patient will report a decrease  in pain level during community ambulation by at least 2 points for improved quality of life. Baseline: 4/10 Goal status: MET     LONG TERM GOALS: Target date: 07/20/23  Pt will demonstrate a an increase of at least 9 points on the LEFS for improved performance of community ambulation and ADL. Baseline: TBA next session; LEFS 30/80 = 37.5%07/14/23:  07/13/23:  LEFS 61/80= 76.3 Goal status: MET  2.  Pt will improve 2 MWT to at least 578 feet in order to demonstrate age norms for functional ambulatory capacity in community setting.  Baseline: TBA as pt tolerates post op soreness; 07/14/23: 458ft no AD Goal status: IN PROGRESS  3.  Pt will demonstrate WFL ROM (flexion and extension) in left knee, for increased mobility and maximal efficiency of gait cycle during ambulation. Baseline: see objective; 07/14/23: 5-116 degrees Goal status: MET  4.  Pt will demonstrate at least 4-/5 MMT for left lower extremity for increased strength during ADL and community ambulation. Baseline: TBA when pain reduces; 07/14/23: MMT see above Goal status: MET     PLAN:  PT FREQUENCY: 2x/week  PT DURATION: 4 weeks  PLANNED INTERVENTIONS: 97164- PT Re-evaluation, 97750- Physical Performance Testing, 97110-Therapeutic exercises, 97530- Therapeutic activity, 97112- Neuromuscular re-education,  97535- Self Care, 72536- Manual therapy, Patient/Family education, Balance training, and Stair training  PLAN FOR NEXT SESSION: discharge 2:12 PM, 07/24/23 Chrissie Dacquisto Small Mariette Cowley MPT [facility] [place] [phone]

## 2023-07-27 ENCOUNTER — Encounter (HOSPITAL_COMMUNITY)

## 2023-07-27 ENCOUNTER — Other Ambulatory Visit: Payer: Self-pay

## 2023-07-27 DIAGNOSIS — Z96652 Presence of left artificial knee joint: Secondary | ICD-10-CM

## 2023-07-28 ENCOUNTER — Ambulatory Visit (HOSPITAL_COMMUNITY)
Admission: RE | Admit: 2023-07-28 | Discharge: 2023-07-28 | Disposition: A | Discharge status: Home / Self Care | Source: Ambulatory Visit | Attending: Orthopaedic Surgery | Admitting: Orthopaedic Surgery

## 2023-07-28 DIAGNOSIS — Z96652 Presence of left artificial knee joint: Secondary | ICD-10-CM

## 2023-08-14 ENCOUNTER — Ambulatory Visit (INDEPENDENT_AMBULATORY_CARE_PROVIDER_SITE_OTHER): Payer: Self-pay

## 2023-08-14 ENCOUNTER — Ambulatory Visit (INDEPENDENT_AMBULATORY_CARE_PROVIDER_SITE_OTHER): Admitting: Orthopaedic Surgery

## 2023-08-14 ENCOUNTER — Encounter: Payer: Self-pay | Admitting: Orthopaedic Surgery

## 2023-08-14 DIAGNOSIS — Z96652 Presence of left artificial knee joint: Secondary | ICD-10-CM

## 2023-08-14 NOTE — Progress Notes (Signed)
 The patient is a 53 year old female who is now 3 months status post a left press-fit total knee arthroplasty.  She does have a right knee replacement.  The knee was done after failure of a partial knee arthroplasty that had been done elsewhere.  She says the left knee has more stiffness and swelling and more symptoms of cracking.  This is likely due to the fact that this is press-fit implants.  On exam she does have pretty good range of motion.  She still lacks full extension and her flexion is improved.  There is a little more crepitation on the side but a lot of this is from having press-fit implants.  She does have some quad atrophy on the side and so if she works on quad strengthening and time that will certainly help.  2 views of the left knee show well-seated press-fit total knee arthroplasty with no complicating features.  She will still work on quad strengthening exercises and increasing her mobility.  I would like to see her in 3 months but for repeat exam.  No x-rays are needed at visit.  We would then x-ray her about 6 months from now.

## 2023-11-15 ENCOUNTER — Other Ambulatory Visit (INDEPENDENT_AMBULATORY_CARE_PROVIDER_SITE_OTHER)

## 2023-11-15 ENCOUNTER — Ambulatory Visit: Admitting: Orthopaedic Surgery

## 2023-11-15 ENCOUNTER — Encounter: Payer: Self-pay | Admitting: Orthopaedic Surgery

## 2023-11-15 DIAGNOSIS — Z96652 Presence of left artificial knee joint: Secondary | ICD-10-CM | POA: Diagnosis not present

## 2023-11-15 NOTE — Progress Notes (Signed)
 The patient is a 53 year old active female who is now 6 months status post a left total knee arthroplasty.  She has done well into about 4 weeks ago she had a mechanical fall that is been somewhat bothersome for her.  She fell directly on that knee.  Overall she has been doing well.  She is walking without any assistive device and no significant limp.  Examination of her left knee shows full extension to almost full flexion.  There is only slight swelling in the knee.  The incision is healed over.  The knee feels ligamentously stable.  2 views of the left knee show well-seated press-fit total knee arthroplasty with no worrisome findings or acute findings and no complicating features.  There is no evidence of loosening.  Will see her back in 6 months for final visit at the year standpoint from surgery with final AP and lateral of the left knee since these are press-fit implants.  I gave her reassurance that knee is stable and showed improved.  She does have a history of a right knee replacement as well.  I will cemented after having a partial knee arthroplasty removed.

## 2023-12-18 ENCOUNTER — Encounter: Payer: Self-pay | Admitting: Radiology

## 2024-02-10 ENCOUNTER — Other Ambulatory Visit: Payer: Self-pay | Admitting: Gastroenterology

## 2024-02-12 NOTE — Telephone Encounter (Signed)
 Limited refills. Needs ov within next 3 months

## 2024-02-14 ENCOUNTER — Ambulatory Visit: Payer: Self-pay

## 2024-02-14 NOTE — Telephone Encounter (Signed)
" °  Reason for Disposition  General information question, no triage required and triager able to answer question  Answer Assessment - Initial Assessment Questions Pt was calling for advise as she needs rx refills prior to her new pt appt in February. This RN recommended she contact her previous PCP for a bridge refill as they are still practicing and she has been seen by them within the past year. Pt agreeable. Advised to call back if she has any difficulty and we can provide her with the mobile clinic information.  Protocols used: Information Only Call - No Triage-A-AH   Copied from CRM [id number]. Topic: Clinical - Medication Question >> Feb 14, 2024  8:12 AM Carrie [name] wrote: Reason for CRM: Pt is concerned about her medication, currently waiting to establish and is in need of refills soon.   Best contact: [phone] "

## 2024-02-20 ENCOUNTER — Other Ambulatory Visit (HOSPITAL_COMMUNITY): Payer: Self-pay | Admitting: Physician Assistant

## 2024-02-20 DIAGNOSIS — N6452 Nipple discharge: Secondary | ICD-10-CM

## 2024-02-29 ENCOUNTER — Encounter (HOSPITAL_COMMUNITY): Payer: Self-pay

## 2024-02-29 ENCOUNTER — Ambulatory Visit (HOSPITAL_COMMUNITY)
Admission: RE | Admit: 2024-02-29 | Discharge: 2024-02-29 | Disposition: A | Source: Ambulatory Visit | Attending: Physician Assistant | Admitting: Physician Assistant

## 2024-02-29 ENCOUNTER — Other Ambulatory Visit (HOSPITAL_COMMUNITY): Payer: Self-pay | Admitting: Physician Assistant

## 2024-02-29 ENCOUNTER — Ambulatory Visit (HOSPITAL_COMMUNITY)
Admission: RE | Admit: 2024-02-29 | Discharge: 2024-02-29 | Disposition: A | Discharge status: Home / Self Care | Source: Ambulatory Visit | Attending: Physician Assistant | Admitting: Physician Assistant

## 2024-02-29 DIAGNOSIS — N6452 Nipple discharge: Secondary | ICD-10-CM

## 2024-02-29 DIAGNOSIS — R928 Other abnormal and inconclusive findings on diagnostic imaging of breast: Secondary | ICD-10-CM

## 2024-03-11 ENCOUNTER — Encounter (HOSPITAL_COMMUNITY): Payer: Self-pay | Admitting: Radiology

## 2024-03-11 ENCOUNTER — Other Ambulatory Visit (HOSPITAL_COMMUNITY): Payer: Self-pay | Admitting: Physician Assistant

## 2024-03-11 DIAGNOSIS — R928 Other abnormal and inconclusive findings on diagnostic imaging of breast: Secondary | ICD-10-CM

## 2024-03-12 ENCOUNTER — Ambulatory Visit (HOSPITAL_COMMUNITY)
Admission: RE | Admit: 2024-03-12 | Discharge: 2024-03-12 | Disposition: A | Discharge status: Home / Self Care | Source: Ambulatory Visit | Attending: Physician Assistant | Admitting: Physician Assistant

## 2024-03-12 ENCOUNTER — Inpatient Hospital Stay (HOSPITAL_COMMUNITY)
Admission: RE | Admit: 2024-03-12 | Discharge: 2024-03-12 | Attending: Physician Assistant | Admitting: Physician Assistant

## 2024-03-12 ENCOUNTER — Encounter (HOSPITAL_COMMUNITY): Payer: Self-pay

## 2024-03-12 DIAGNOSIS — R928 Other abnormal and inconclusive findings on diagnostic imaging of breast: Secondary | ICD-10-CM | POA: Diagnosis present

## 2024-03-12 MED ORDER — LIDOCAINE HCL (PF) 2 % IJ SOLN
INTRAMUSCULAR | Status: AC
  Filled 2024-03-12: qty 10

## 2024-03-12 MED ORDER — LIDOCAINE HCL (PF) 2 % IJ SOLN
10.0000 mL | Freq: Once | INTRAMUSCULAR | Status: AC
  Administered 2024-03-12: 10 mL via INTRADERMAL

## 2024-03-12 MED ORDER — LIDOCAINE-EPINEPHRINE (PF) 1 %-1:200000 IJ SOLN
INTRAMUSCULAR | Status: AC
  Filled 2024-03-12: qty 30

## 2024-03-12 MED ORDER — LIDOCAINE-EPINEPHRINE (PF) 1 %-1:200000 IJ SOLN
30.0000 mL | Freq: Once | INTRAMUSCULAR | Status: AC
  Administered 2024-03-12: 10 mL via INTRADERMAL

## 2024-03-12 NOTE — Progress Notes (Signed)
 Patient brought to US  RM2 in no acute distress. Left breast biopsy explained. Consent obtained. Prepped and draped in sterile manner. Local anesthetic admin without adverse reaction. Access obtained under US  guidance. Samples collected and retrieved. Access removed without complication. Bandage placed. No bleeding or hematoma. DC instructions provided. Transferred to mammogram for further study.

## 2024-03-13 LAB — SURGICAL PATHOLOGY

## 2024-03-28 ENCOUNTER — Ambulatory Visit: Admitting: General Surgery

## 2024-04-12 ENCOUNTER — Ambulatory Visit: Admitting: Physician Assistant

## 2024-04-23 ENCOUNTER — Ambulatory Visit: Admitting: Physician Assistant

## 2024-05-15 ENCOUNTER — Ambulatory Visit: Admitting: Orthopaedic Surgery
